# Patient Record
Sex: Male | Born: 1960
Health system: Southern US, Community
[De-identification: ages and names within clinical notes are randomized; demographics above are authoritative.]

## PROBLEM LIST (undated history)

## (undated) DIAGNOSIS — A539 Syphilis, unspecified: Secondary | ICD-10-CM

## (undated) DIAGNOSIS — J189 Pneumonia, unspecified organism: Secondary | ICD-10-CM

## (undated) DIAGNOSIS — B20 Human immunodeficiency virus [HIV] disease: Secondary | ICD-10-CM

## (undated) DIAGNOSIS — I10 Essential (primary) hypertension: Secondary | ICD-10-CM

## (undated) DIAGNOSIS — F419 Anxiety disorder, unspecified: Secondary | ICD-10-CM

## (undated) HISTORY — PX: KNEE ARTHROSCOPY: SUR90

## (undated) HISTORY — DX: Anxiety disorder, unspecified: F41.9

## (undated) HISTORY — DX: Pneumonia, unspecified organism: J18.9

---

## 1999-06-18 ENCOUNTER — Encounter: Admission: RE | Admit: 1999-06-18 | Discharge: 1999-06-18 | Payer: Self-pay | Admitting: Cardiology

## 1999-06-18 ENCOUNTER — Encounter: Payer: Self-pay | Admitting: Cardiology

## 2000-07-15 ENCOUNTER — Emergency Department (HOSPITAL_COMMUNITY): Admission: EM | Admit: 2000-07-15 | Discharge: 2000-07-15 | Payer: Self-pay | Admitting: Emergency Medicine

## 2000-07-31 ENCOUNTER — Emergency Department (HOSPITAL_COMMUNITY): Admission: EM | Admit: 2000-07-31 | Discharge: 2000-07-31 | Payer: Self-pay | Admitting: Emergency Medicine

## 2001-06-03 ENCOUNTER — Encounter: Payer: Self-pay | Admitting: Emergency Medicine

## 2001-06-03 ENCOUNTER — Ambulatory Visit (HOSPITAL_COMMUNITY): Admission: RE | Admit: 2001-06-03 | Discharge: 2001-06-03 | Payer: Self-pay | Admitting: Emergency Medicine

## 2001-09-27 ENCOUNTER — Ambulatory Visit (HOSPITAL_COMMUNITY): Admission: RE | Admit: 2001-09-27 | Discharge: 2001-09-27 | Payer: Self-pay | Admitting: Internal Medicine

## 2001-09-27 ENCOUNTER — Encounter: Payer: Self-pay | Admitting: Internal Medicine

## 2001-10-05 ENCOUNTER — Inpatient Hospital Stay (HOSPITAL_COMMUNITY): Admission: EM | Admit: 2001-10-05 | Discharge: 2001-10-08 | Payer: Self-pay | Admitting: Emergency Medicine

## 2001-10-05 ENCOUNTER — Encounter: Payer: Self-pay | Admitting: Emergency Medicine

## 2001-10-08 ENCOUNTER — Encounter: Payer: Self-pay | Admitting: Internal Medicine

## 2001-10-13 ENCOUNTER — Encounter: Payer: Self-pay | Admitting: Emergency Medicine

## 2001-10-13 ENCOUNTER — Inpatient Hospital Stay (HOSPITAL_COMMUNITY): Admission: EM | Admit: 2001-10-13 | Discharge: 2001-10-19 | Payer: Self-pay | Admitting: Emergency Medicine

## 2001-10-13 ENCOUNTER — Encounter: Payer: Self-pay | Admitting: Internal Medicine

## 2001-10-17 ENCOUNTER — Encounter: Payer: Self-pay | Admitting: Internal Medicine

## 2001-10-18 ENCOUNTER — Encounter: Payer: Self-pay | Admitting: Internal Medicine

## 2001-11-14 ENCOUNTER — Encounter: Payer: Self-pay | Admitting: Internal Medicine

## 2001-11-14 ENCOUNTER — Ambulatory Visit (HOSPITAL_COMMUNITY): Admission: RE | Admit: 2001-11-14 | Discharge: 2001-11-14 | Payer: Self-pay | Admitting: Internal Medicine

## 2002-05-20 ENCOUNTER — Encounter: Admission: RE | Admit: 2002-05-20 | Discharge: 2002-05-20 | Payer: Self-pay | Admitting: General Surgery

## 2002-05-20 ENCOUNTER — Encounter: Payer: Self-pay | Admitting: General Surgery

## 2002-05-21 ENCOUNTER — Encounter (INDEPENDENT_AMBULATORY_CARE_PROVIDER_SITE_OTHER): Payer: Self-pay | Admitting: *Deleted

## 2002-05-21 ENCOUNTER — Ambulatory Visit (HOSPITAL_BASED_OUTPATIENT_CLINIC_OR_DEPARTMENT_OTHER): Admission: RE | Admit: 2002-05-21 | Discharge: 2002-05-21 | Payer: Self-pay | Admitting: General Surgery

## 2002-05-28 ENCOUNTER — Inpatient Hospital Stay (HOSPITAL_COMMUNITY): Admission: EM | Admit: 2002-05-28 | Discharge: 2002-05-31 | Payer: Self-pay | Admitting: Emergency Medicine

## 2003-09-04 ENCOUNTER — Ambulatory Visit: Payer: Self-pay | Admitting: Family Medicine

## 2003-09-26 ENCOUNTER — Ambulatory Visit: Payer: Self-pay | Admitting: Family Medicine

## 2003-12-11 ENCOUNTER — Ambulatory Visit: Payer: Self-pay | Admitting: Family Medicine

## 2003-12-12 ENCOUNTER — Ambulatory Visit: Payer: Self-pay | Admitting: Family Medicine

## 2003-12-16 ENCOUNTER — Ambulatory Visit: Payer: Self-pay | Admitting: *Deleted

## 2003-12-25 ENCOUNTER — Ambulatory Visit: Payer: Self-pay | Admitting: Family Medicine

## 2004-01-14 ENCOUNTER — Ambulatory Visit: Payer: Self-pay | Admitting: Internal Medicine

## 2004-01-22 ENCOUNTER — Ambulatory Visit: Payer: Self-pay | Admitting: Family Medicine

## 2004-02-13 ENCOUNTER — Ambulatory Visit: Payer: Self-pay | Admitting: Family Medicine

## 2004-02-24 ENCOUNTER — Ambulatory Visit: Payer: Self-pay | Admitting: Family Medicine

## 2004-03-26 ENCOUNTER — Ambulatory Visit: Payer: Self-pay | Admitting: Family Medicine

## 2004-04-13 ENCOUNTER — Ambulatory Visit: Payer: Self-pay | Admitting: Family Medicine

## 2004-08-09 ENCOUNTER — Ambulatory Visit: Payer: Self-pay | Admitting: Family Medicine

## 2004-08-12 ENCOUNTER — Ambulatory Visit: Payer: Self-pay | Admitting: Family Medicine

## 2004-09-01 ENCOUNTER — Ambulatory Visit: Payer: Self-pay | Admitting: Family Medicine

## 2004-10-14 ENCOUNTER — Ambulatory Visit (HOSPITAL_COMMUNITY): Admission: RE | Admit: 2004-10-14 | Discharge: 2004-10-14 | Payer: Self-pay | Admitting: Infectious Diseases

## 2004-10-14 ENCOUNTER — Ambulatory Visit: Payer: Self-pay | Admitting: Infectious Diseases

## 2004-10-14 ENCOUNTER — Encounter (INDEPENDENT_AMBULATORY_CARE_PROVIDER_SITE_OTHER): Payer: Self-pay | Admitting: *Deleted

## 2004-10-14 LAB — CONVERTED CEMR LAB
CD4 Count: 820 microliters
CD4 T Cell Abs: 820
HIV 1 RNA Quant: 49 copies/mL

## 2004-11-01 ENCOUNTER — Ambulatory Visit: Payer: Self-pay | Admitting: Infectious Diseases

## 2005-02-16 ENCOUNTER — Encounter (INDEPENDENT_AMBULATORY_CARE_PROVIDER_SITE_OTHER): Payer: Self-pay | Admitting: *Deleted

## 2005-02-16 ENCOUNTER — Ambulatory Visit: Payer: Self-pay | Admitting: Infectious Diseases

## 2005-02-16 LAB — CONVERTED CEMR LAB: CD4 Count: 370 microliters

## 2005-10-04 ENCOUNTER — Encounter: Admission: RE | Admit: 2005-10-04 | Discharge: 2005-10-04 | Payer: Self-pay | Admitting: Infectious Diseases

## 2005-10-04 ENCOUNTER — Ambulatory Visit: Payer: Self-pay | Admitting: Infectious Diseases

## 2005-10-04 ENCOUNTER — Encounter (INDEPENDENT_AMBULATORY_CARE_PROVIDER_SITE_OTHER): Payer: Self-pay | Admitting: *Deleted

## 2005-10-04 DIAGNOSIS — B20 Human immunodeficiency virus [HIV] disease: Secondary | ICD-10-CM | POA: Insufficient documentation

## 2005-10-04 DIAGNOSIS — A6000 Herpesviral infection of urogenital system, unspecified: Secondary | ICD-10-CM | POA: Insufficient documentation

## 2005-10-04 LAB — CONVERTED CEMR LAB: HIV 1 RNA Quant: 49 copies/mL

## 2005-10-18 ENCOUNTER — Ambulatory Visit: Payer: Self-pay | Admitting: Infectious Diseases

## 2005-10-18 ENCOUNTER — Encounter: Payer: Self-pay | Admitting: Internal Medicine

## 2005-11-08 ENCOUNTER — Emergency Department (HOSPITAL_COMMUNITY): Admission: EM | Admit: 2005-11-08 | Discharge: 2005-11-08 | Payer: Self-pay | Admitting: Emergency Medicine

## 2005-11-09 ENCOUNTER — Ambulatory Visit: Payer: Self-pay | Admitting: Infectious Diseases

## 2005-11-09 ENCOUNTER — Ambulatory Visit: Payer: Self-pay | Admitting: *Deleted

## 2005-11-09 ENCOUNTER — Inpatient Hospital Stay (HOSPITAL_COMMUNITY): Admission: EM | Admit: 2005-11-09 | Discharge: 2005-11-19 | Payer: Self-pay | Admitting: Emergency Medicine

## 2005-11-14 ENCOUNTER — Encounter (INDEPENDENT_AMBULATORY_CARE_PROVIDER_SITE_OTHER): Payer: Self-pay | Admitting: Cardiology

## 2005-11-16 ENCOUNTER — Encounter: Payer: Self-pay | Admitting: Vascular Surgery

## 2005-11-23 ENCOUNTER — Encounter (INDEPENDENT_AMBULATORY_CARE_PROVIDER_SITE_OTHER): Payer: Self-pay | Admitting: Ophthalmology

## 2005-11-23 ENCOUNTER — Ambulatory Visit: Payer: Self-pay | Admitting: Internal Medicine

## 2005-11-23 LAB — CONVERTED CEMR LAB
Basophils Relative: 0 % (ref 0–1)
Eosinophils Relative: 1 % (ref 0–4)
HCT: 36.1 % — ABNORMAL LOW (ref 41.0–49.0)
Hemoglobin: 12.2 g/dL — ABNORMAL LOW (ref 13.9–16.8)
Lymphocytes Relative: 33 % (ref 15–43)
Lymphs Abs: 3.3 10*3/uL — ABNORMAL HIGH (ref 0.8–3.1)
MCV: 98.4 fL (ref 78.8–100.0)
Monocytes Absolute: 0.9 10*3/uL — ABNORMAL HIGH (ref 0.2–0.7)
Platelets: 748 10*3/uL — ABNORMAL HIGH (ref 152–374)
RDW: 13.3 % (ref 11.5–15.3)
WBC: 10.2 10*3/uL — ABNORMAL HIGH (ref 3.7–10.0)

## 2005-12-01 ENCOUNTER — Encounter (INDEPENDENT_AMBULATORY_CARE_PROVIDER_SITE_OTHER): Payer: Self-pay | Admitting: Unknown Physician Specialty

## 2005-12-01 ENCOUNTER — Ambulatory Visit: Payer: Self-pay | Admitting: Hospitalist

## 2005-12-01 ENCOUNTER — Ambulatory Visit (HOSPITAL_COMMUNITY): Admission: RE | Admit: 2005-12-01 | Discharge: 2005-12-01 | Payer: Self-pay | Admitting: Hospitalist

## 2005-12-01 LAB — CONVERTED CEMR LAB
Basophils Absolute: 0 10*3/uL (ref 0.0–0.1)
Basophils Relative: 0 % (ref 0–1)
CO2: 27 meq/L (ref 19–32)
Chloride: 102 meq/L (ref 96–112)
Creatinine, Ser: 1.09 mg/dL (ref 0.40–1.50)
MCHC: 33.2 g/dL (ref 33.1–35.4)
MCV: 97.6 fL (ref 78.8–100.0)
Monocytes Relative: 9 % (ref 3–11)
Neutro Abs: 4 10*3/uL (ref 1.8–6.8)
Neutrophils Relative %: 51 % (ref 47–77)
Platelets: 422 10*3/uL — ABNORMAL HIGH (ref 152–374)
Potassium: 4.2 meq/L (ref 3.5–5.3)
RBC: 3.7 M/uL — ABNORMAL LOW (ref 4.20–5.50)
RDW: 13 % (ref 11.5–15.3)
Sodium: 137 meq/L (ref 135–145)

## 2005-12-05 ENCOUNTER — Ambulatory Visit: Payer: Self-pay | Admitting: Internal Medicine

## 2005-12-05 ENCOUNTER — Encounter (INDEPENDENT_AMBULATORY_CARE_PROVIDER_SITE_OTHER): Payer: Self-pay | Admitting: Ophthalmology

## 2005-12-05 LAB — CONVERTED CEMR LAB: Cortisol, Plasma: 9.4 ug/dL

## 2005-12-19 ENCOUNTER — Ambulatory Visit: Payer: Self-pay | Admitting: Internal Medicine

## 2006-01-04 ENCOUNTER — Encounter (INDEPENDENT_AMBULATORY_CARE_PROVIDER_SITE_OTHER): Payer: Self-pay | Admitting: Pulmonary Disease

## 2006-01-04 ENCOUNTER — Ambulatory Visit: Payer: Self-pay | Admitting: Internal Medicine

## 2006-01-04 ENCOUNTER — Ambulatory Visit (HOSPITAL_COMMUNITY): Admission: RE | Admit: 2006-01-04 | Discharge: 2006-01-04 | Payer: Self-pay | Admitting: Internal Medicine

## 2006-01-04 LAB — CONVERTED CEMR LAB
BUN: 8 mg/dL (ref 6–23)
Basophils Relative: 0 % (ref 0–1)
Creatinine, Ser: 0.82 mg/dL (ref 0.40–1.50)
Eosinophils Relative: 1 % (ref 0–5)
Glucose, Bld: 103 mg/dL — ABNORMAL HIGH (ref 70–99)
HCT: 43.1 % (ref 39.0–52.0)
Hemoglobin: 14.5 g/dL (ref 13.0–17.0)
Lymphocytes Relative: 20 % (ref 12–46)
Lymphs Abs: 2.8 10*3/uL (ref 0.7–3.3)
MCV: 98.3 fL (ref 78.0–100.0)
Monocytes Relative: 9 % (ref 3–11)
Platelets: 297 10*3/uL (ref 150–400)
Potassium: 4.3 meq/L (ref 3.5–5.3)
RBC: 4.38 M/uL (ref 4.22–5.81)
WBC: 13.7 10*3/uL — ABNORMAL HIGH (ref 4.0–10.5)

## 2006-01-11 ENCOUNTER — Ambulatory Visit: Payer: Self-pay | Admitting: Internal Medicine

## 2006-01-11 ENCOUNTER — Encounter: Payer: Self-pay | Admitting: Internal Medicine

## 2006-01-11 ENCOUNTER — Encounter (INDEPENDENT_AMBULATORY_CARE_PROVIDER_SITE_OTHER): Payer: Self-pay | Admitting: Internal Medicine

## 2006-02-27 ENCOUNTER — Encounter (INDEPENDENT_AMBULATORY_CARE_PROVIDER_SITE_OTHER): Payer: Self-pay | Admitting: Infectious Diseases

## 2006-02-27 ENCOUNTER — Encounter (INDEPENDENT_AMBULATORY_CARE_PROVIDER_SITE_OTHER): Payer: Self-pay | Admitting: *Deleted

## 2006-02-27 LAB — CONVERTED CEMR LAB

## 2006-03-12 ENCOUNTER — Encounter (INDEPENDENT_AMBULATORY_CARE_PROVIDER_SITE_OTHER): Payer: Self-pay | Admitting: *Deleted

## 2006-03-14 ENCOUNTER — Encounter (INDEPENDENT_AMBULATORY_CARE_PROVIDER_SITE_OTHER): Payer: Self-pay | Admitting: *Deleted

## 2006-03-28 ENCOUNTER — Telehealth (INDEPENDENT_AMBULATORY_CARE_PROVIDER_SITE_OTHER): Payer: Self-pay | Admitting: Infectious Diseases

## 2006-04-27 ENCOUNTER — Telehealth (INDEPENDENT_AMBULATORY_CARE_PROVIDER_SITE_OTHER): Payer: Self-pay | Admitting: Infectious Diseases

## 2006-04-27 ENCOUNTER — Ambulatory Visit: Payer: Self-pay | Admitting: Internal Medicine

## 2006-05-25 ENCOUNTER — Encounter: Payer: Self-pay | Admitting: *Deleted

## 2006-05-25 ENCOUNTER — Inpatient Hospital Stay (HOSPITAL_COMMUNITY): Admission: AD | Admit: 2006-05-25 | Discharge: 2006-05-27 | Payer: Self-pay | Admitting: *Deleted

## 2006-05-25 ENCOUNTER — Ambulatory Visit: Payer: Self-pay | Admitting: Internal Medicine

## 2006-05-25 ENCOUNTER — Encounter (INDEPENDENT_AMBULATORY_CARE_PROVIDER_SITE_OTHER): Payer: Self-pay | Admitting: Infectious Diseases

## 2006-05-25 ENCOUNTER — Encounter (INDEPENDENT_AMBULATORY_CARE_PROVIDER_SITE_OTHER): Payer: Self-pay | Admitting: Pulmonary Disease

## 2006-05-25 ENCOUNTER — Ambulatory Visit: Payer: Self-pay | Admitting: *Deleted

## 2006-05-25 ENCOUNTER — Encounter: Admission: RE | Admit: 2006-05-25 | Discharge: 2006-05-25 | Payer: Self-pay | Admitting: Internal Medicine

## 2006-05-25 DIAGNOSIS — R6510 Systemic inflammatory response syndrome (SIRS) of non-infectious origin without acute organ dysfunction: Secondary | ICD-10-CM | POA: Insufficient documentation

## 2006-05-25 DIAGNOSIS — R000 Tachycardia, unspecified: Secondary | ICD-10-CM

## 2006-05-25 DIAGNOSIS — R350 Frequency of micturition: Secondary | ICD-10-CM

## 2006-05-25 LAB — CONVERTED CEMR LAB: CD4 Count: 340 microliters

## 2006-05-26 ENCOUNTER — Telehealth (INDEPENDENT_AMBULATORY_CARE_PROVIDER_SITE_OTHER): Payer: Self-pay | Admitting: Infectious Diseases

## 2006-05-27 ENCOUNTER — Encounter (INDEPENDENT_AMBULATORY_CARE_PROVIDER_SITE_OTHER): Payer: Self-pay | Admitting: Internal Medicine

## 2006-05-27 DIAGNOSIS — D696 Thrombocytopenia, unspecified: Secondary | ICD-10-CM | POA: Insufficient documentation

## 2006-05-30 ENCOUNTER — Ambulatory Visit: Payer: Self-pay | Admitting: Internal Medicine

## 2006-05-30 ENCOUNTER — Encounter (INDEPENDENT_AMBULATORY_CARE_PROVIDER_SITE_OTHER): Payer: Self-pay | Admitting: Internal Medicine

## 2006-05-30 LAB — CONVERTED CEMR LAB
MCHC: 33.7 g/dL (ref 30.0–36.0)
RBC: 4.55 M/uL (ref 4.22–5.81)
RDW: 13.1 % (ref 11.5–14.0)

## 2006-06-15 ENCOUNTER — Ambulatory Visit: Payer: Self-pay | Admitting: Internal Medicine

## 2006-06-16 ENCOUNTER — Encounter (INDEPENDENT_AMBULATORY_CARE_PROVIDER_SITE_OTHER): Payer: Self-pay | Admitting: Infectious Diseases

## 2006-06-19 ENCOUNTER — Encounter: Admission: RE | Admit: 2006-06-19 | Discharge: 2006-06-19 | Payer: Self-pay | Admitting: Internal Medicine

## 2006-06-19 ENCOUNTER — Ambulatory Visit: Payer: Self-pay | Admitting: Internal Medicine

## 2006-06-19 ENCOUNTER — Telehealth: Payer: Self-pay | Admitting: *Deleted

## 2006-06-19 ENCOUNTER — Encounter (INDEPENDENT_AMBULATORY_CARE_PROVIDER_SITE_OTHER): Payer: Self-pay | Admitting: Infectious Diseases

## 2006-06-19 ENCOUNTER — Encounter (INDEPENDENT_AMBULATORY_CARE_PROVIDER_SITE_OTHER): Payer: Self-pay | Admitting: Internal Medicine

## 2006-06-19 DIAGNOSIS — H524 Presbyopia: Secondary | ICD-10-CM

## 2006-06-19 DIAGNOSIS — R351 Nocturia: Secondary | ICD-10-CM | POA: Insufficient documentation

## 2006-06-22 ENCOUNTER — Ambulatory Visit: Payer: Self-pay | Admitting: Infectious Diseases

## 2006-06-23 ENCOUNTER — Telehealth (INDEPENDENT_AMBULATORY_CARE_PROVIDER_SITE_OTHER): Payer: Self-pay | Admitting: Infectious Diseases

## 2006-07-25 ENCOUNTER — Telehealth: Payer: Self-pay | Admitting: Internal Medicine

## 2006-08-23 ENCOUNTER — Telehealth: Payer: Self-pay | Admitting: Internal Medicine

## 2006-09-14 ENCOUNTER — Ambulatory Visit: Payer: Self-pay | Admitting: Internal Medicine

## 2006-09-20 ENCOUNTER — Encounter: Admission: RE | Admit: 2006-09-20 | Discharge: 2006-09-20 | Payer: Self-pay | Admitting: Internal Medicine

## 2006-09-20 ENCOUNTER — Ambulatory Visit: Payer: Self-pay | Admitting: Internal Medicine

## 2006-09-20 DIAGNOSIS — F528 Other sexual dysfunction not due to a substance or known physiological condition: Secondary | ICD-10-CM

## 2006-09-20 LAB — CONVERTED CEMR LAB
AST: 21 units/L (ref 0–37)
Albumin: 4.4 g/dL (ref 3.5–5.2)
BUN: 11 mg/dL (ref 6–23)
Basophils Relative: 0 % (ref 0–1)
CO2: 25 meq/L (ref 19–32)
Calcium: 9.2 mg/dL (ref 8.4–10.5)
Chlamydia, Swab/Urine, PCR: NEGATIVE
Chloride: 105 meq/L (ref 96–112)
GC Probe Amp, Urine: NEGATIVE
HIV 1 RNA Quant: <50 copies/mL (ref <50)
Lymphocytes Relative: 35 % (ref 12–46)
Lymphs Abs: 3 10*3/uL (ref 0.7–3.3)
MCHC: 35.7 g/dL (ref 30.0–36.0)
Monocytes Relative: 8 % (ref 3–11)
Neutro Abs: 4.9 10*3/uL (ref 1.7–7.7)
Neutrophils Relative %: 56 % (ref 43–77)
Potassium: 4.2 meq/L (ref 3.5–5.3)
RBC: 4.67 M/uL (ref 4.22–5.81)
WBC: 8.7 10*3/uL (ref 4.0–10.5)

## 2006-09-26 ENCOUNTER — Telehealth: Payer: Self-pay | Admitting: Internal Medicine

## 2006-10-06 ENCOUNTER — Ambulatory Visit: Payer: Self-pay | Admitting: Internal Medicine

## 2006-10-12 ENCOUNTER — Telehealth: Payer: Self-pay | Admitting: Internal Medicine

## 2006-10-18 ENCOUNTER — Telehealth: Payer: Self-pay | Admitting: Internal Medicine

## 2006-10-23 ENCOUNTER — Telehealth: Payer: Self-pay | Admitting: Internal Medicine

## 2006-11-07 ENCOUNTER — Telehealth: Payer: Self-pay | Admitting: Internal Medicine

## 2006-11-23 ENCOUNTER — Emergency Department (HOSPITAL_COMMUNITY): Admission: EM | Admit: 2006-11-23 | Discharge: 2006-11-23 | Payer: Self-pay | Admitting: Emergency Medicine

## 2006-11-23 ENCOUNTER — Telehealth: Payer: Self-pay | Admitting: Internal Medicine

## 2006-11-28 ENCOUNTER — Ambulatory Visit: Payer: Self-pay | Admitting: Internal Medicine

## 2006-11-29 LAB — CONVERTED CEMR LAB

## 2006-12-11 ENCOUNTER — Telehealth: Payer: Self-pay | Admitting: Internal Medicine

## 2006-12-25 ENCOUNTER — Telehealth: Payer: Self-pay | Admitting: Internal Medicine

## 2007-01-23 ENCOUNTER — Telehealth: Payer: Self-pay | Admitting: Internal Medicine

## 2007-01-24 ENCOUNTER — Telehealth: Payer: Self-pay | Admitting: Internal Medicine

## 2007-01-24 ENCOUNTER — Ambulatory Visit: Payer: Self-pay | Admitting: Internal Medicine

## 2007-01-24 DIAGNOSIS — M545 Low back pain: Secondary | ICD-10-CM

## 2007-01-24 DIAGNOSIS — J309 Allergic rhinitis, unspecified: Secondary | ICD-10-CM

## 2007-01-24 LAB — CONVERTED CEMR LAB
Blood in Urine, dipstick: NEGATIVE
Ketones, urine, test strip: NEGATIVE
Urobilinogen, UA: 0.2
WBC Urine, dipstick: NEGATIVE

## 2007-02-22 ENCOUNTER — Telehealth: Payer: Self-pay | Admitting: Internal Medicine

## 2007-03-08 ENCOUNTER — Ambulatory Visit: Payer: Self-pay | Admitting: Infectious Diseases

## 2007-03-08 ENCOUNTER — Encounter
Admission: RE | Admit: 2007-03-08 | Discharge: 2007-03-08 | Payer: Self-pay | Admitting: Certified Registered Nurse Anesthetist

## 2007-03-08 ENCOUNTER — Encounter (INDEPENDENT_AMBULATORY_CARE_PROVIDER_SITE_OTHER): Payer: Self-pay | Admitting: Internal Medicine

## 2007-03-08 DIAGNOSIS — R3919 Other difficulties with micturition: Secondary | ICD-10-CM

## 2007-03-08 LAB — CONVERTED CEMR LAB
Albumin: 4.4 g/dL (ref 3.5–5.2)
Alkaline Phosphatase: 61 units/L (ref 39–117)
CO2: 26 meq/L (ref 19–32)
GC Probe Amp, Urine: NEGATIVE
Glucose, Bld: 87 mg/dL (ref 70–99)
HIV-1 RNA Quant, Log: <1.7 (ref <1.70)
Hep B C IgM: NEGATIVE
Hepatitis B Surface Ag: NEGATIVE
Ketones, ur: NEGATIVE mg/dL
Lymphocytes Relative: 24 % (ref 12–46)
Lymphs Abs: 3 10*3/uL (ref 0.7–4.0)
Neutro Abs: 8.4 10*3/uL — ABNORMAL HIGH (ref 1.7–7.7)
Neutrophils Relative %: 67 % (ref 43–77)
Nitrite: NEGATIVE
Platelets: 225 10*3/uL (ref 150–400)
Potassium: 3.9 meq/L (ref 3.5–5.3)
RBC / HPF: NONE SEEN (ref <3)
RPR Titer: 1:8 {titer}
Sodium: 139 meq/L (ref 135–145)
Specific Gravity, Urine: 1.011 (ref 1.005–1.03)
Total Protein: 7.3 g/dL (ref 6.0–8.3)
WBC, UA: NONE SEEN cells/hpf (ref <3)
WBC: 12.6 10*3/uL — ABNORMAL HIGH (ref 4.0–10.5)
pH: 6.5 (ref 5.0–8.0)

## 2007-03-09 ENCOUNTER — Ambulatory Visit: Payer: Self-pay | Admitting: Infectious Diseases

## 2007-03-09 ENCOUNTER — Encounter (INDEPENDENT_AMBULATORY_CARE_PROVIDER_SITE_OTHER): Payer: Self-pay | Admitting: Internal Medicine

## 2007-03-09 ENCOUNTER — Encounter: Admission: RE | Admit: 2007-03-09 | Discharge: 2007-03-09 | Payer: Self-pay | Admitting: Infectious Diseases

## 2007-03-09 DIAGNOSIS — A539 Syphilis, unspecified: Secondary | ICD-10-CM

## 2007-03-09 DIAGNOSIS — D551 Anemia due to other disorders of glutathione metabolism: Secondary | ICD-10-CM | POA: Insufficient documentation

## 2007-03-09 LAB — CONVERTED CEMR LAB: Retic Ct Pct: 2.1 %

## 2007-03-10 ENCOUNTER — Encounter (INDEPENDENT_AMBULATORY_CARE_PROVIDER_SITE_OTHER): Payer: Self-pay | Admitting: *Deleted

## 2007-03-12 ENCOUNTER — Encounter (INDEPENDENT_AMBULATORY_CARE_PROVIDER_SITE_OTHER): Payer: Self-pay | Admitting: *Deleted

## 2007-03-14 ENCOUNTER — Ambulatory Visit: Payer: Self-pay | Admitting: Internal Medicine

## 2007-03-15 ENCOUNTER — Telehealth: Payer: Self-pay

## 2007-03-20 ENCOUNTER — Telehealth: Payer: Self-pay | Admitting: Internal Medicine

## 2007-03-23 LAB — CONVERTED CEMR LAB
Basophils Absolute: 0 10*3/uL (ref 0.0–0.1)
Basophils Relative: 0 % (ref 0–1)
Calcium: 9.3 mg/dL (ref 8.4–10.5)
Creatinine, Ser: 1.24 mg/dL (ref 0.40–1.50)
Eosinophils Absolute: 0.1 10*3/uL (ref 0.0–0.7)
Eosinophils Relative: 1 % (ref 0–5)
HCT: 33.2 % — ABNORMAL LOW (ref 39.0–52.0)
LDH: 297 units/L — ABNORMAL HIGH (ref 94–250)
Lymphocytes Relative: 32 % (ref 12–46)
MCHC: 33.4 g/dL (ref 30.0–36.0)
MCV: 103.4 fL — ABNORMAL HIGH (ref 78.0–100.0)
Monocytes Absolute: 1 10*3/uL (ref 0.1–1.0)
Platelets: 345 10*3/uL (ref 150–400)
RDW: 14.9 % (ref 11.5–15.5)
Sodium: 139 meq/L (ref 135–145)
Total Bilirubin: 0.5 mg/dL (ref 0.3–1.2)

## 2007-04-17 ENCOUNTER — Encounter (INDEPENDENT_AMBULATORY_CARE_PROVIDER_SITE_OTHER): Payer: Self-pay | Admitting: *Deleted

## 2007-04-20 ENCOUNTER — Emergency Department (HOSPITAL_COMMUNITY): Admission: EM | Admit: 2007-04-20 | Discharge: 2007-04-20 | Payer: Self-pay | Admitting: Emergency Medicine

## 2007-04-20 ENCOUNTER — Telehealth: Payer: Self-pay | Admitting: Internal Medicine

## 2007-04-25 ENCOUNTER — Ambulatory Visit: Payer: Self-pay | Admitting: Internal Medicine

## 2007-04-25 DIAGNOSIS — J301 Allergic rhinitis due to pollen: Secondary | ICD-10-CM

## 2007-04-25 LAB — CONVERTED CEMR LAB
Chlamydia, Swab/Urine, PCR: NEGATIVE
GC Probe Amp, Urine: NEGATIVE

## 2007-04-30 ENCOUNTER — Telehealth (INDEPENDENT_AMBULATORY_CARE_PROVIDER_SITE_OTHER): Payer: Self-pay | Admitting: *Deleted

## 2007-05-23 ENCOUNTER — Telehealth (INDEPENDENT_AMBULATORY_CARE_PROVIDER_SITE_OTHER): Payer: Self-pay | Admitting: *Deleted

## 2007-06-22 ENCOUNTER — Ambulatory Visit: Payer: Self-pay | Admitting: Internal Medicine

## 2007-06-25 ENCOUNTER — Telehealth (INDEPENDENT_AMBULATORY_CARE_PROVIDER_SITE_OTHER): Payer: Self-pay | Admitting: *Deleted

## 2007-07-02 ENCOUNTER — Ambulatory Visit: Payer: Self-pay | Admitting: Internal Medicine

## 2007-07-02 ENCOUNTER — Encounter: Admission: RE | Admit: 2007-07-02 | Discharge: 2007-07-02 | Payer: Self-pay | Admitting: Internal Medicine

## 2007-07-02 LAB — CONVERTED CEMR LAB
Alkaline Phosphatase: 67 units/L (ref 39–117)
Basophils Absolute: 0 10*3/uL (ref 0.0–0.1)
CO2: 26 meq/L (ref 19–32)
Cholesterol: 185 mg/dL (ref 0–200)
Creatinine, Ser: 1.34 mg/dL (ref 0.40–1.50)
Eosinophils Absolute: 0.1 10*3/uL (ref 0.0–0.7)
Eosinophils Relative: 2 % (ref 0–5)
Glucose, Bld: 61 mg/dL — ABNORMAL LOW (ref 70–99)
HCT: 43 % (ref 39.0–52.0)
HIV-1 RNA Quant, Log: <1.7 (ref <1.70)
Hemoglobin: 14.4 g/dL (ref 13.0–17.0)
LDL Cholesterol: 89 mg/dL (ref 0–99)
MCV: 101.4 fL — ABNORMAL HIGH (ref 78.0–100.0)
Monocytes Absolute: 0.8 10*3/uL (ref 0.1–1.0)
Platelets: 266 10*3/uL (ref 150–400)
RDW: 13 % (ref 11.5–15.5)
Sodium: 140 meq/L (ref 135–145)
Total Bilirubin: 0.5 mg/dL (ref 0.3–1.2)
Total CHOL/HDL Ratio: 3.7
Total Protein: 7 g/dL (ref 6.0–8.3)
Triglycerides: 229 mg/dL — ABNORMAL HIGH (ref <150)
VLDL: 46 mg/dL — ABNORMAL HIGH (ref 0–40)

## 2007-07-17 ENCOUNTER — Ambulatory Visit: Payer: Self-pay | Admitting: Internal Medicine

## 2007-07-17 DIAGNOSIS — R5383 Other fatigue: Secondary | ICD-10-CM

## 2007-07-17 DIAGNOSIS — R5381 Other malaise: Secondary | ICD-10-CM | POA: Insufficient documentation

## 2007-07-24 ENCOUNTER — Telehealth (INDEPENDENT_AMBULATORY_CARE_PROVIDER_SITE_OTHER): Payer: Self-pay | Admitting: *Deleted

## 2007-08-21 ENCOUNTER — Telehealth (INDEPENDENT_AMBULATORY_CARE_PROVIDER_SITE_OTHER): Payer: Self-pay | Admitting: *Deleted

## 2007-09-20 ENCOUNTER — Telehealth (INDEPENDENT_AMBULATORY_CARE_PROVIDER_SITE_OTHER): Payer: Self-pay | Admitting: *Deleted

## 2007-10-15 ENCOUNTER — Ambulatory Visit: Payer: Self-pay | Admitting: Internal Medicine

## 2007-10-17 ENCOUNTER — Telehealth (INDEPENDENT_AMBULATORY_CARE_PROVIDER_SITE_OTHER): Payer: Self-pay | Admitting: *Deleted

## 2007-10-19 ENCOUNTER — Ambulatory Visit: Payer: Self-pay | Admitting: Internal Medicine

## 2007-10-19 LAB — CONVERTED CEMR LAB
ALT: 21 units/L (ref 0–53)
AST: 25 units/L (ref 0–37)
Albumin: 4.1 g/dL (ref 3.5–5.2)
Basophils Absolute: 0 10*3/uL (ref 0.0–0.1)
Basophils Relative: 0 % (ref 0–1)
Calcium: 9.2 mg/dL (ref 8.4–10.5)
Chlamydia, Swab/Urine, PCR: NEGATIVE
Chloride: 104 meq/L (ref 96–112)
GC Probe Amp, Urine: NEGATIVE
HIV-1 RNA Quant, Log: <1.7 (ref <1.70)
MCHC: 34.1 g/dL (ref 30.0–36.0)
Neutro Abs: 4.9 10*3/uL (ref 1.7–7.7)
Neutrophils Relative %: 56 % (ref 43–77)
Potassium: 4.3 meq/L (ref 3.5–5.3)
RBC: 4.82 M/uL (ref 4.22–5.81)
RDW: 12.2 % (ref 11.5–15.5)
Sodium: 139 meq/L (ref 135–145)
Total Protein: 7.1 g/dL (ref 6.0–8.3)

## 2007-10-29 ENCOUNTER — Telehealth: Payer: Self-pay | Admitting: *Deleted

## 2007-10-29 ENCOUNTER — Emergency Department (HOSPITAL_COMMUNITY): Admission: EM | Admit: 2007-10-29 | Discharge: 2007-10-29 | Payer: Self-pay | Admitting: Emergency Medicine

## 2007-11-02 ENCOUNTER — Ambulatory Visit: Payer: Self-pay | Admitting: Internal Medicine

## 2007-11-16 ENCOUNTER — Telehealth (INDEPENDENT_AMBULATORY_CARE_PROVIDER_SITE_OTHER): Payer: Self-pay | Admitting: *Deleted

## 2007-12-11 ENCOUNTER — Telehealth (INDEPENDENT_AMBULATORY_CARE_PROVIDER_SITE_OTHER): Payer: Self-pay | Admitting: *Deleted

## 2008-01-07 ENCOUNTER — Encounter (INDEPENDENT_AMBULATORY_CARE_PROVIDER_SITE_OTHER): Payer: Self-pay | Admitting: *Deleted

## 2008-01-07 ENCOUNTER — Ambulatory Visit: Payer: Self-pay | Admitting: Internal Medicine

## 2008-01-07 DIAGNOSIS — J029 Acute pharyngitis, unspecified: Secondary | ICD-10-CM | POA: Insufficient documentation

## 2008-01-07 LAB — CONVERTED CEMR LAB
GC Probe Amp, Urine: NEGATIVE
RPR Ser Ql: REACTIVE — AB

## 2008-01-10 ENCOUNTER — Telehealth (INDEPENDENT_AMBULATORY_CARE_PROVIDER_SITE_OTHER): Payer: Self-pay | Admitting: *Deleted

## 2008-01-11 ENCOUNTER — Telehealth (INDEPENDENT_AMBULATORY_CARE_PROVIDER_SITE_OTHER): Payer: Self-pay | Admitting: *Deleted

## 2008-01-28 ENCOUNTER — Encounter: Payer: Self-pay | Admitting: Internal Medicine

## 2008-01-28 ENCOUNTER — Ambulatory Visit (HOSPITAL_COMMUNITY): Admission: RE | Admit: 2008-01-28 | Discharge: 2008-01-28 | Payer: Self-pay | Admitting: Internal Medicine

## 2008-01-28 ENCOUNTER — Telehealth: Payer: Self-pay | Admitting: *Deleted

## 2008-01-28 ENCOUNTER — Ambulatory Visit: Payer: Self-pay | Admitting: Internal Medicine

## 2008-02-08 ENCOUNTER — Telehealth (INDEPENDENT_AMBULATORY_CARE_PROVIDER_SITE_OTHER): Payer: Self-pay | Admitting: *Deleted

## 2008-02-22 ENCOUNTER — Ambulatory Visit: Payer: Self-pay | Admitting: Internal Medicine

## 2008-02-22 ENCOUNTER — Encounter: Payer: Self-pay | Admitting: Internal Medicine

## 2008-02-26 LAB — CONVERTED CEMR LAB
Sex Hormone Binding: 53 nmol/L (ref 13–71)
Testosterone: 1284.83 ng/dL — ABNORMAL HIGH (ref 350–890)

## 2008-03-13 ENCOUNTER — Telehealth (INDEPENDENT_AMBULATORY_CARE_PROVIDER_SITE_OTHER): Payer: Self-pay | Admitting: *Deleted

## 2008-03-25 ENCOUNTER — Ambulatory Visit: Payer: Self-pay | Admitting: Internal Medicine

## 2008-03-27 ENCOUNTER — Encounter (INDEPENDENT_AMBULATORY_CARE_PROVIDER_SITE_OTHER): Payer: Self-pay | Admitting: *Deleted

## 2008-04-07 ENCOUNTER — Telehealth (INDEPENDENT_AMBULATORY_CARE_PROVIDER_SITE_OTHER): Payer: Self-pay | Admitting: *Deleted

## 2008-04-08 ENCOUNTER — Encounter: Payer: Self-pay | Admitting: Internal Medicine

## 2008-04-09 ENCOUNTER — Encounter: Payer: Self-pay | Admitting: Internal Medicine

## 2008-04-15 ENCOUNTER — Encounter: Payer: Self-pay | Admitting: Internal Medicine

## 2008-04-25 ENCOUNTER — Encounter (INDEPENDENT_AMBULATORY_CARE_PROVIDER_SITE_OTHER): Payer: Self-pay | Admitting: *Deleted

## 2008-04-30 ENCOUNTER — Telehealth (INDEPENDENT_AMBULATORY_CARE_PROVIDER_SITE_OTHER): Payer: Self-pay | Admitting: *Deleted

## 2008-05-09 ENCOUNTER — Ambulatory Visit: Payer: Self-pay | Admitting: Internal Medicine

## 2008-05-09 LAB — CONVERTED CEMR LAB
Bacteria, UA: NONE SEEN
Basophils Absolute: 0 10*3/uL (ref 0.0–0.1)
Bilirubin Urine: NEGATIVE
CO2: 26 meq/L (ref 19–32)
Chlamydia, DNA Probe: NEGATIVE
GFR calc Af Amer: >60 mL/min (ref >60)
GFR calc non Af Amer: 58 mL/min — ABNORMAL LOW (ref >60)
Glucose, Bld: 88 mg/dL (ref 70–99)
HIV 1 RNA Quant: <48 copies/mL (ref <48)
HIV-1 RNA Quant, Log: <1.68 (ref <1.68)
Hemoglobin, Urine: NEGATIVE
Hemoglobin: 15.3 g/dL (ref 13.0–17.0)
Ketones, ur: NEGATIVE mg/dL
Lymphocytes Relative: 39 % (ref 12–46)
Monocytes Absolute: 0.7 10*3/uL (ref 0.1–1.0)
Neutro Abs: 4.2 10*3/uL (ref 1.7–7.7)
Neutrophils Relative %: 48 % (ref 43–77)
Platelets: 262 10*3/uL (ref 150–400)
Protein, ur: 30 mg/dL — AB
RDW: 12.8 % (ref 11.5–15.5)
Sodium: 140 meq/L (ref 135–145)
Total Bilirubin: 1 mg/dL (ref 0.3–1.2)
Total Protein: 7.4 g/dL (ref 6.0–8.3)
Urobilinogen, UA: 0.2 (ref 0.0–1.0)

## 2008-05-28 ENCOUNTER — Ambulatory Visit: Payer: Self-pay | Admitting: Internal Medicine

## 2008-05-29 ENCOUNTER — Telehealth: Payer: Self-pay

## 2008-06-06 ENCOUNTER — Telehealth (INDEPENDENT_AMBULATORY_CARE_PROVIDER_SITE_OTHER): Payer: Self-pay | Admitting: *Deleted

## 2008-06-21 IMAGING — XA IR US GUIDE VASC ACCESS RIGHT
1 series · 2 of 2 positions shown · non-contrast
Comparison: none

CLINICAL DATA: Hypotension.  Request to place PICC line.
 UPPER EXTREMITY PICC PLACEMENT WITH ULTRASOUND AND FLUORO GUIDANCE:
TECHNIQUE: The right arm was prepped with Betadine, draped in the usual sterile fashion, and infiltrated locally with 1% Lidocaine.  Ultrasound demonstrated patency of the right brachial vein.  Under real-time ultrasound guidance, this vein was accessed with a 21 gauge micropuncture needle.  Ultrasound image documentation was performed.  The needle was exchanged over a guidewire for a peel-away sheath through which a 5 French triple lumen PICC catheter trimmed to 42cm was advanced, positioned with its tip at the distal SVC/right atrial junction.  Fluoroscopy during the procedure and fluoro spot radiograph confirms appropriate catheter position.  The catheter was flushed, secured to the skin with Prolene sutures, and covered with a sterile dressing. No immediate complication. 
 Fluoro. time:  0.2 minutes

[Series 1: run · 2 of 2 slices shown]
[im 1/2]
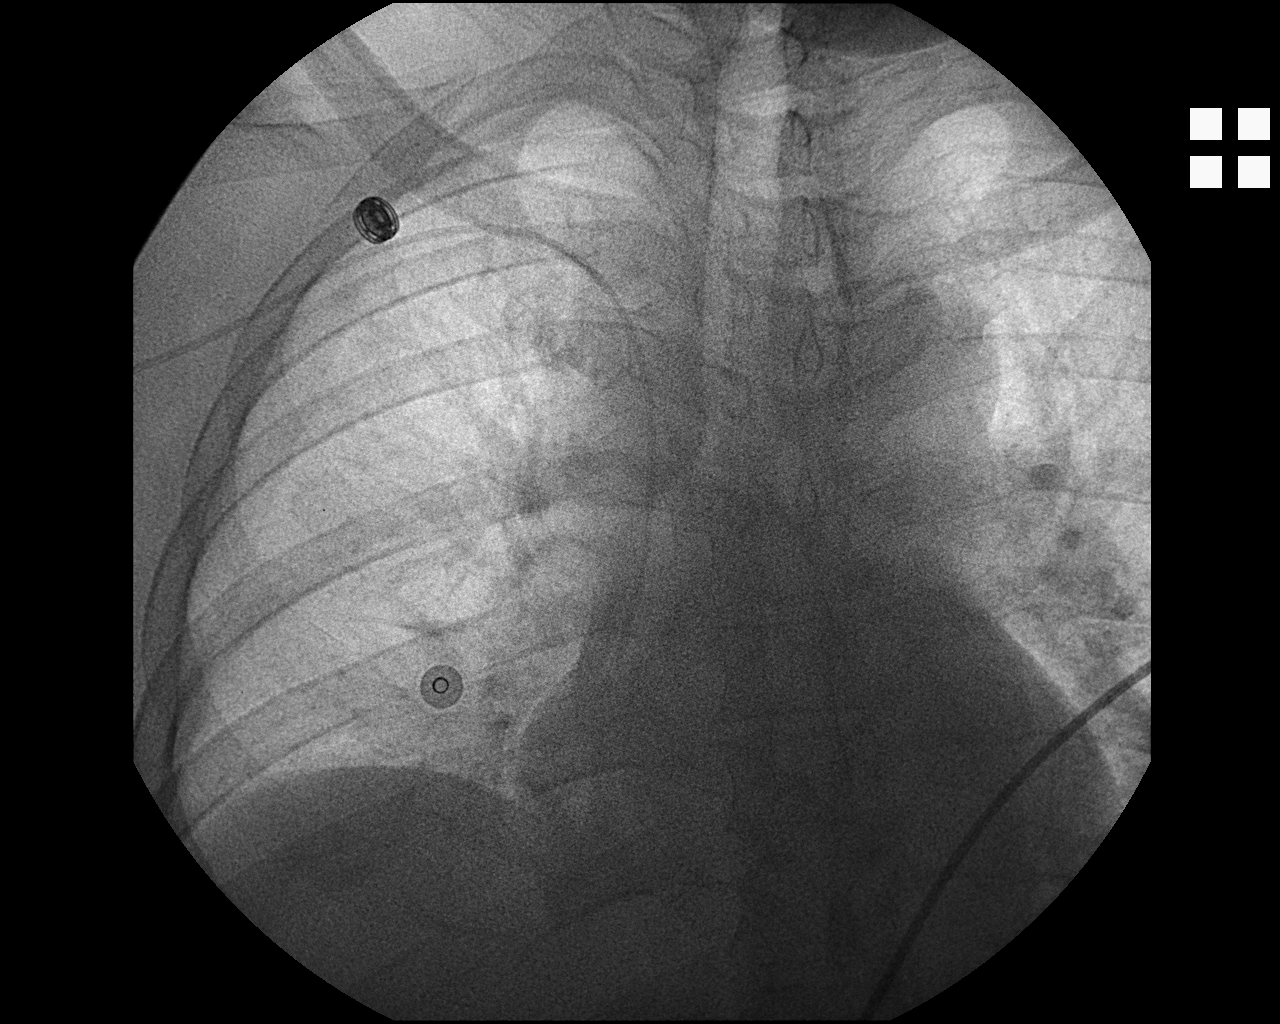
[im 2/2]
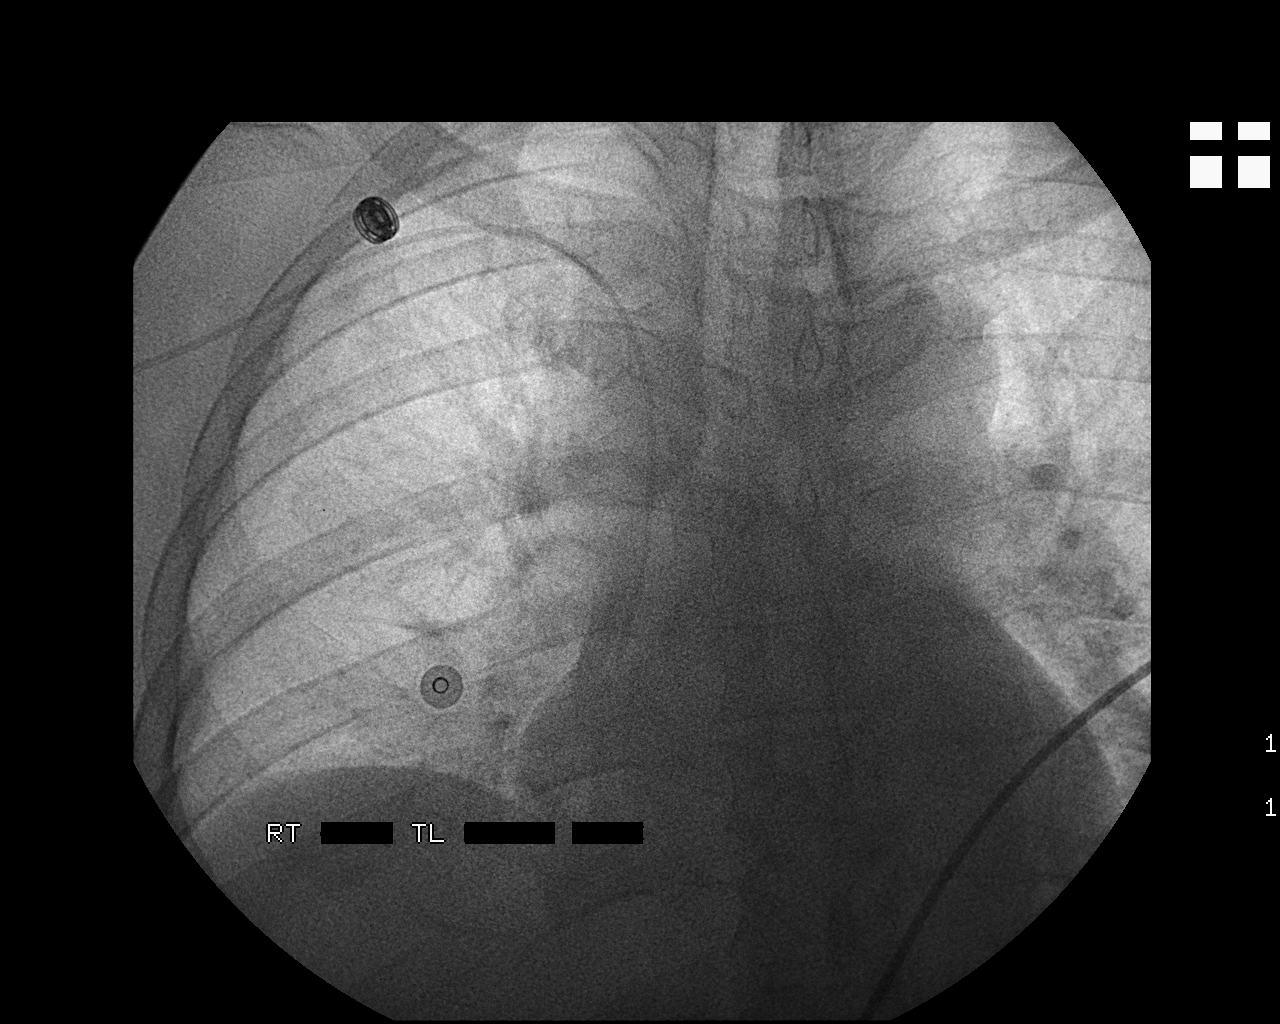

[2 of 2 positions shown; findings below may reference images not displayed]

IMPRESSION: Technically successful right arm PICC placement with ultrasound and fluoroscopic guidance.  Ready for routine use.

## 2008-06-21 IMAGING — CR DG CHEST 1V PORT
1 series · 1 of 1 positions shown · non-contrast
Comparison: 11/08/05.

CLINICAL DATA: 44-year-old with hypertension. 
 PORTABLE CHEST ? 1 VIEW:

[view not recorded]
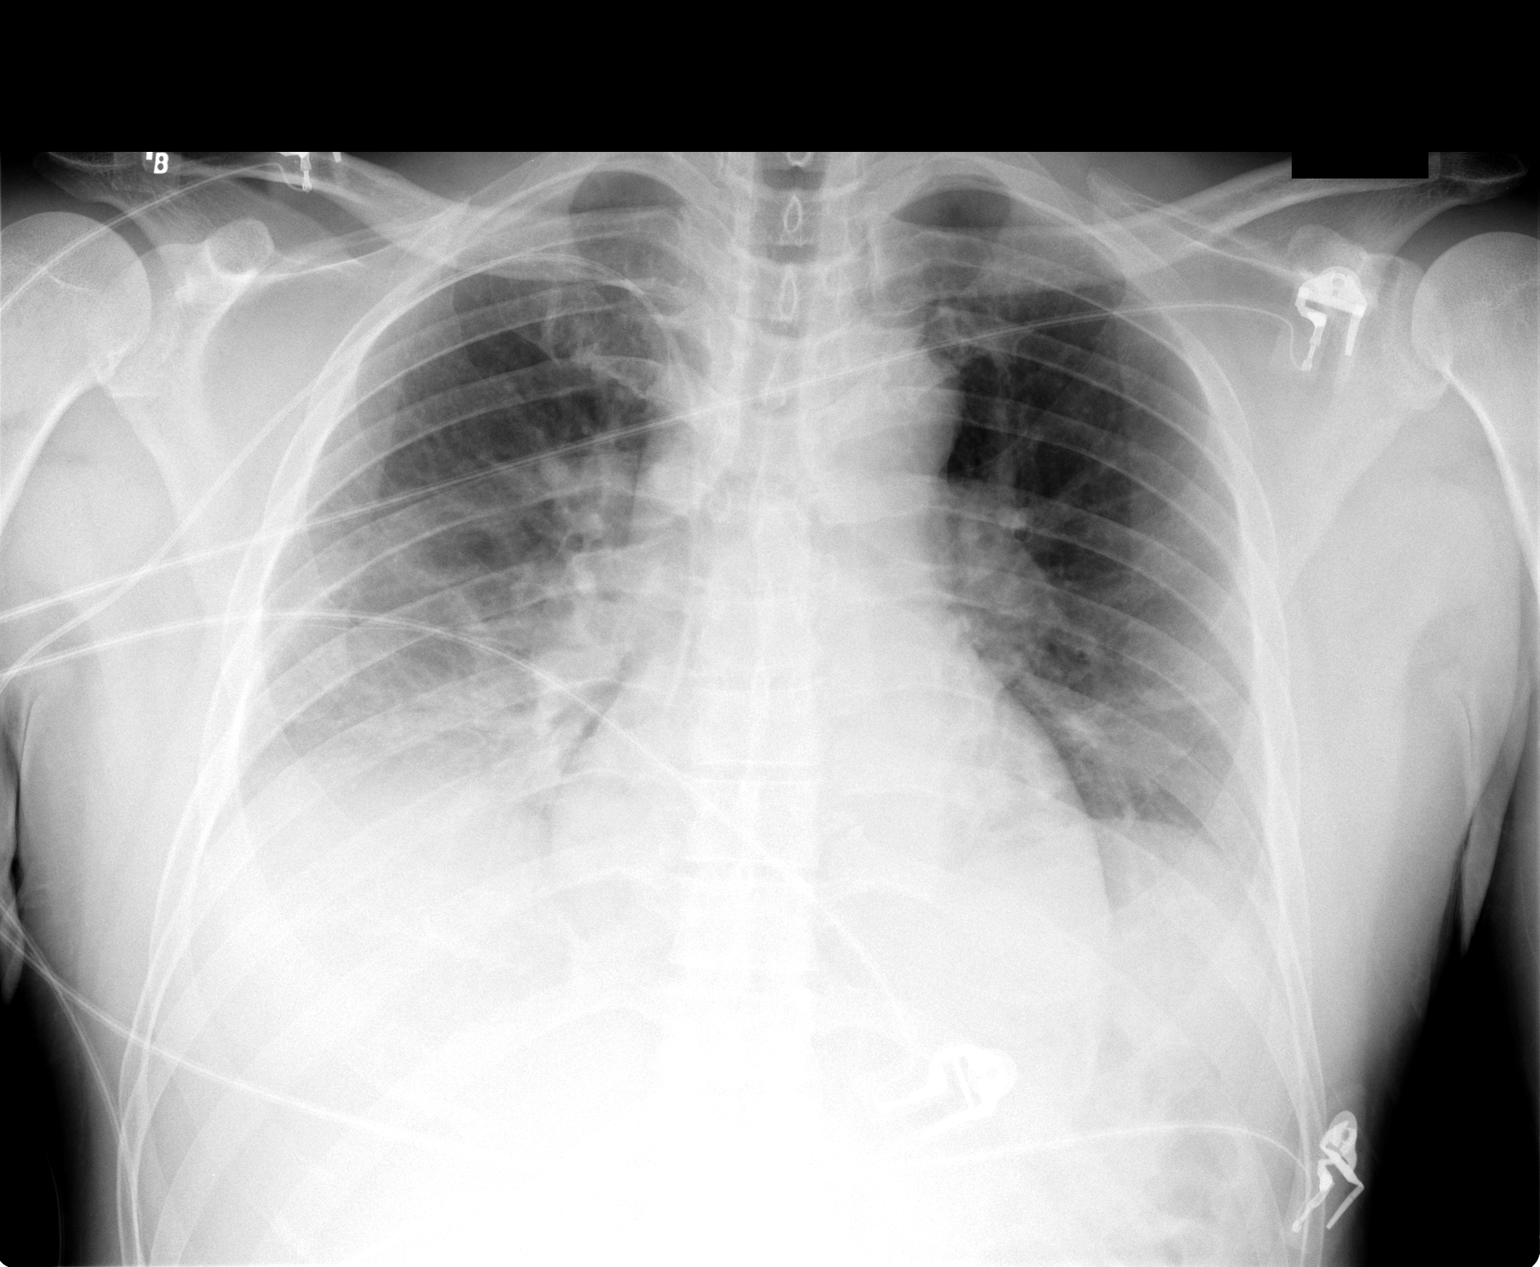

[1 of 1 positions shown; findings below may reference images not displayed]

FINDINGS: Right PICC line tip is in good position in the distal SVC, near the cavoatrial junction.  Very low volume chest film with vascular crowding and bibasilar atelectasis.  There may be a small right effusion.
IMPRESSION: 1. Right PICC line tip in good position in the distal SVC at the cavoatrial junction. 
 2. Interval development of more significant bibasilar atelectasis, right greater than left.

## 2008-06-21 IMAGING — CR DG CHEST 1V PORT
1 series · 1 of 1 positions shown · non-contrast
Comparison: 11/08/2005

CLINICAL DATA: Short of breath.
 PORTABLE CHEST - 11/09/2005 AT 5125 HOURS:

[view not recorded]
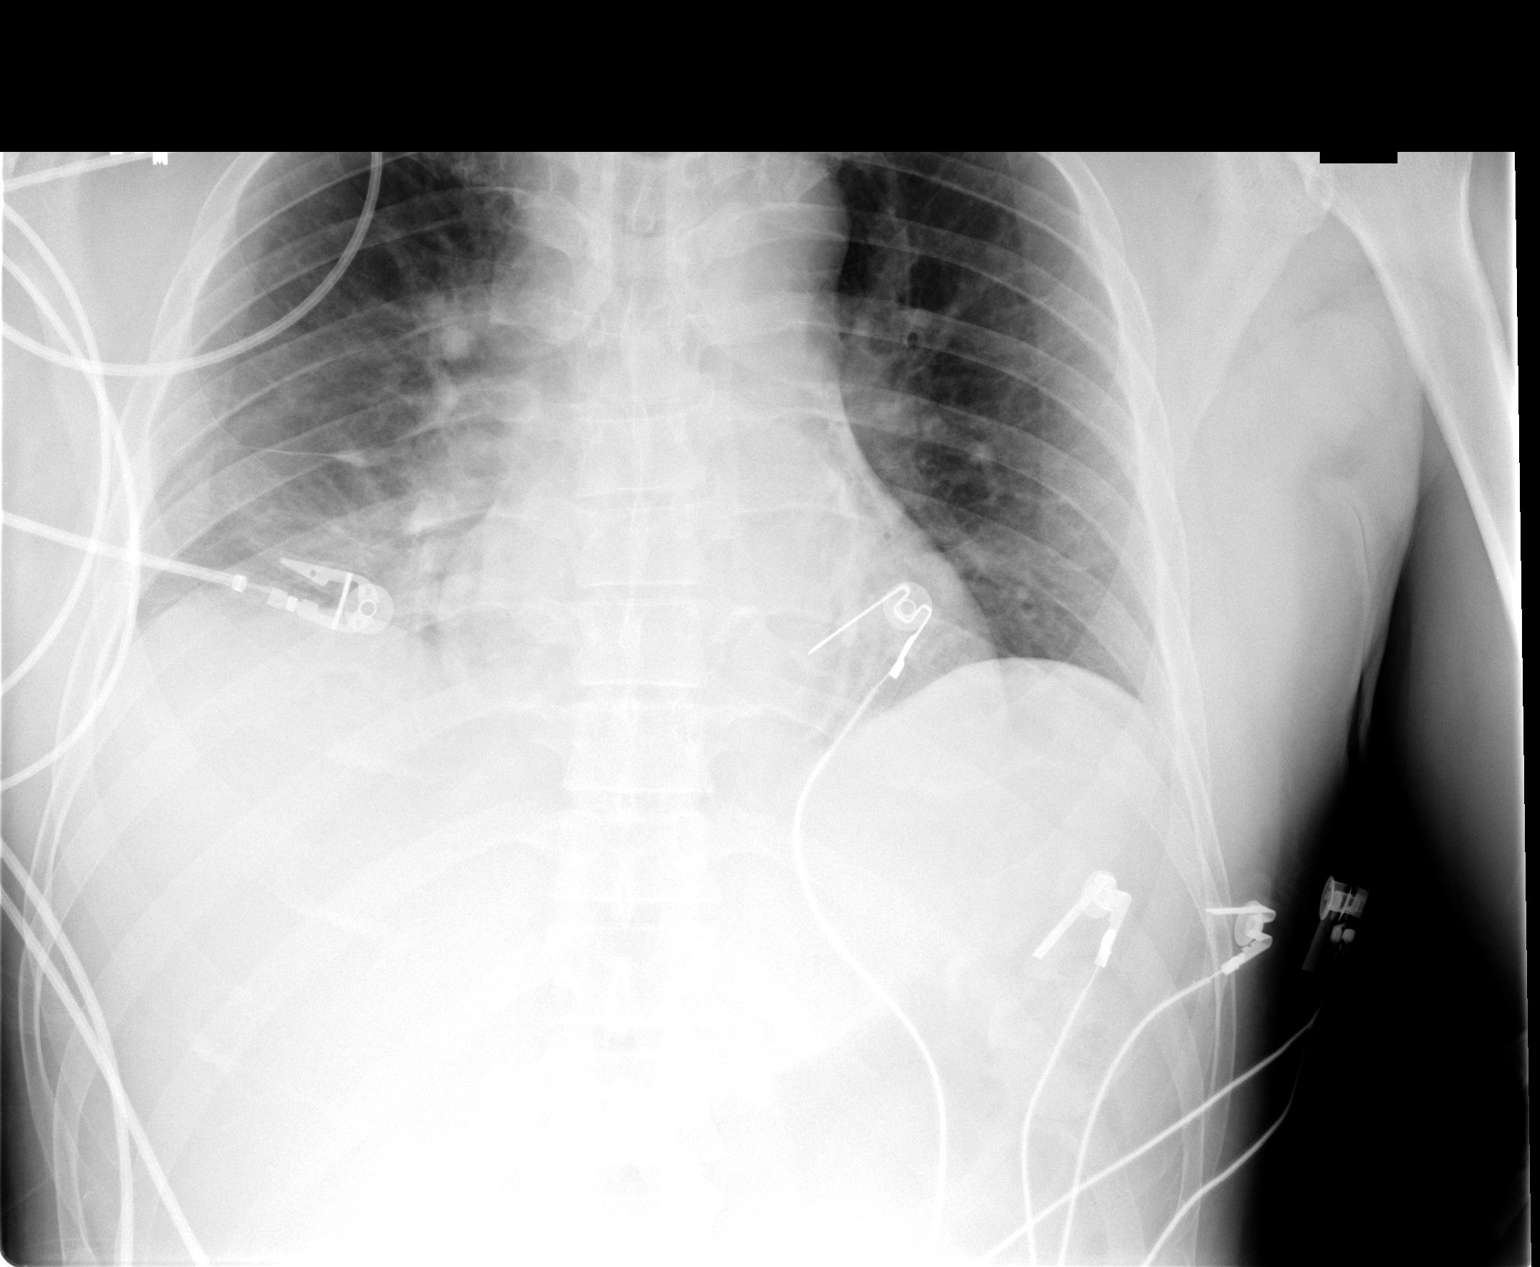

[1 of 1 positions shown; findings below may reference images not displayed]

FINDINGS: Right lower lobe airspace disease has developed.  The left lung is under inflated and grossly clear.   Peribronchial thickening is stable. Lungs are under inflated.
IMPRESSION: Interval development of right lower lobe airspace disease.

## 2008-07-18 ENCOUNTER — Telehealth: Payer: Self-pay | Admitting: Infectious Diseases

## 2008-07-28 ENCOUNTER — Telehealth (INDEPENDENT_AMBULATORY_CARE_PROVIDER_SITE_OTHER): Payer: Self-pay | Admitting: *Deleted

## 2008-07-30 ENCOUNTER — Telehealth (INDEPENDENT_AMBULATORY_CARE_PROVIDER_SITE_OTHER): Payer: Self-pay | Admitting: *Deleted

## 2008-08-22 ENCOUNTER — Telehealth (INDEPENDENT_AMBULATORY_CARE_PROVIDER_SITE_OTHER): Payer: Self-pay | Admitting: *Deleted

## 2008-09-23 ENCOUNTER — Ambulatory Visit: Payer: Self-pay | Admitting: Internal Medicine

## 2008-09-23 LAB — CONVERTED CEMR LAB
ALT: 73 units/L — ABNORMAL HIGH (ref 0–53)
AST: 43 units/L — ABNORMAL HIGH (ref 0–37)
Albumin: 4.2 g/dL (ref 3.5–5.2)
Alkaline Phosphatase: 83 units/L (ref 39–117)
Basophils Absolute: 0 10*3/uL (ref 0.0–0.1)
Basophils Relative: 0 % (ref 0–1)
Calcium: 9.5 mg/dL (ref 8.4–10.5)
Chloride: 106 meq/L (ref 96–112)
Creatinine, Ser: 1.24 mg/dL (ref 0.40–1.50)
Eosinophils Relative: 2 % (ref 0–5)
HCT: 41.8 % (ref 39.0–52.0)
Lymphocytes Relative: 35 % (ref 12–46)
MCHC: 34.4 g/dL (ref 30.0–36.0)
Platelets: 325 10*3/uL (ref 150–400)
Potassium: 4.6 meq/L (ref 3.5–5.3)
RDW: 12.4 % (ref 11.5–15.5)

## 2008-09-24 ENCOUNTER — Telehealth (INDEPENDENT_AMBULATORY_CARE_PROVIDER_SITE_OTHER): Payer: Self-pay | Admitting: *Deleted

## 2008-10-08 ENCOUNTER — Ambulatory Visit: Payer: Self-pay | Admitting: Internal Medicine

## 2008-10-08 DIAGNOSIS — R21 Rash and other nonspecific skin eruption: Secondary | ICD-10-CM | POA: Insufficient documentation

## 2008-10-09 LAB — CONVERTED CEMR LAB: Chlamydia, Swab/Urine, PCR: NEGATIVE

## 2008-10-22 ENCOUNTER — Telehealth (INDEPENDENT_AMBULATORY_CARE_PROVIDER_SITE_OTHER): Payer: Self-pay | Admitting: *Deleted

## 2008-11-20 ENCOUNTER — Telehealth (INDEPENDENT_AMBULATORY_CARE_PROVIDER_SITE_OTHER): Payer: Self-pay | Admitting: *Deleted

## 2008-12-09 ENCOUNTER — Ambulatory Visit: Payer: Self-pay | Admitting: Internal Medicine

## 2008-12-22 ENCOUNTER — Telehealth (INDEPENDENT_AMBULATORY_CARE_PROVIDER_SITE_OTHER): Payer: Self-pay | Admitting: *Deleted

## 2008-12-22 ENCOUNTER — Telehealth: Payer: Self-pay | Admitting: Internal Medicine

## 2008-12-22 ENCOUNTER — Ambulatory Visit: Payer: Self-pay | Admitting: Internal Medicine

## 2008-12-22 DIAGNOSIS — L03211 Cellulitis of face: Secondary | ICD-10-CM

## 2008-12-22 DIAGNOSIS — L0201 Cutaneous abscess of face: Secondary | ICD-10-CM

## 2009-01-04 IMAGING — CT CT HEAD W/O CM
1 of 2 series · 13 of 30 positions shown, 17 images · non-contrast
Comparison: none

HISTORY: Meningococcemia, sepsis, headache, [place] Guilty, dizziness

[Series 2: brain · axial · 0.47mm/px · z∈[+141,+280]mm · 13 of 32 slices shown, 17 images]
[im 3/32  brain]
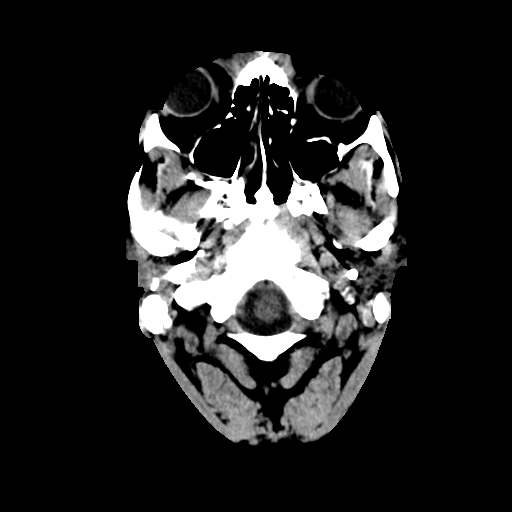
[im 3/32  bone]
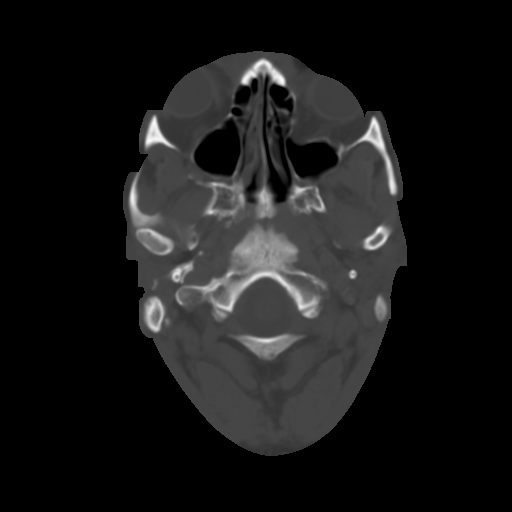
[im 5/32  brain]
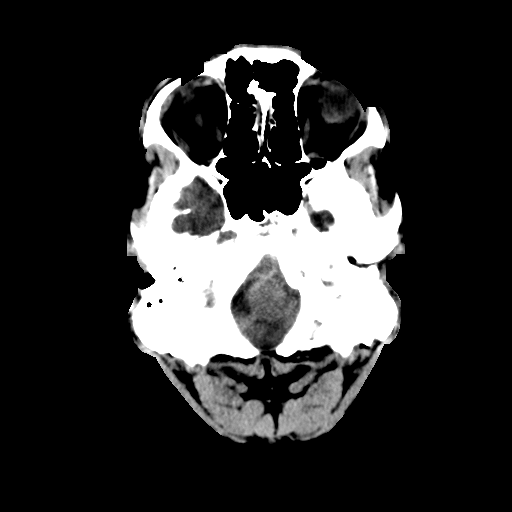
[im 7/32  brain]
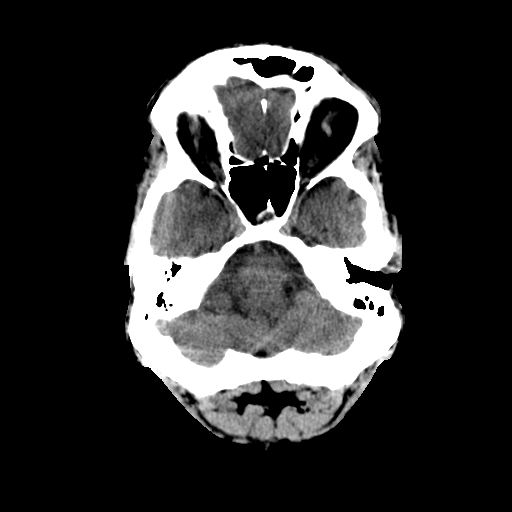
[im 9/32  brain]
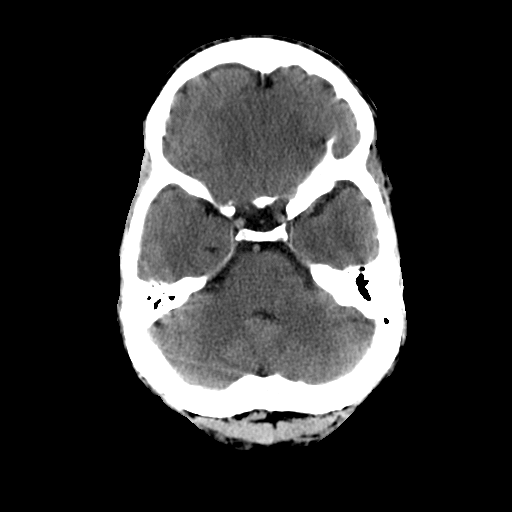
[im 12/32  brain]
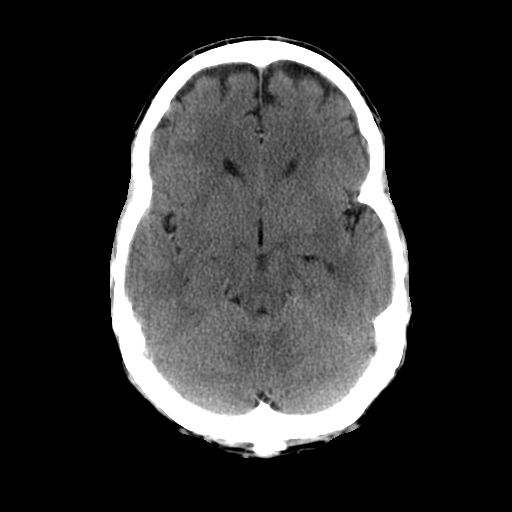
[im 12/32  bone]
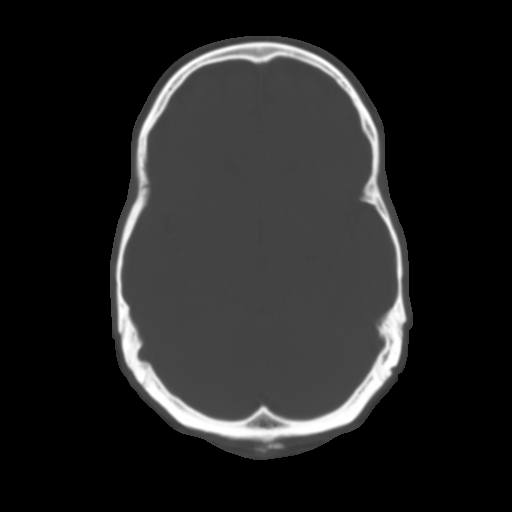
[im 14/32  brain]
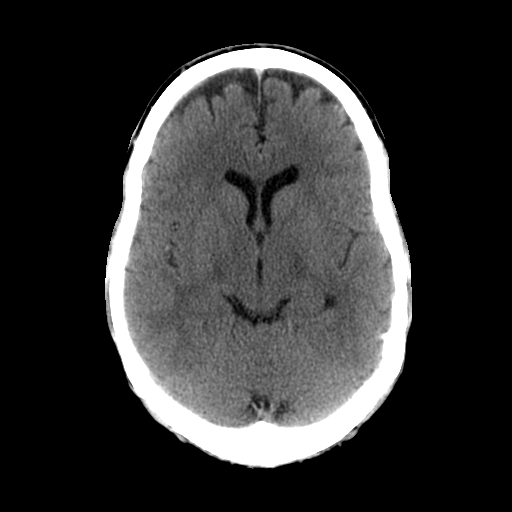
[im 16/32  brain]
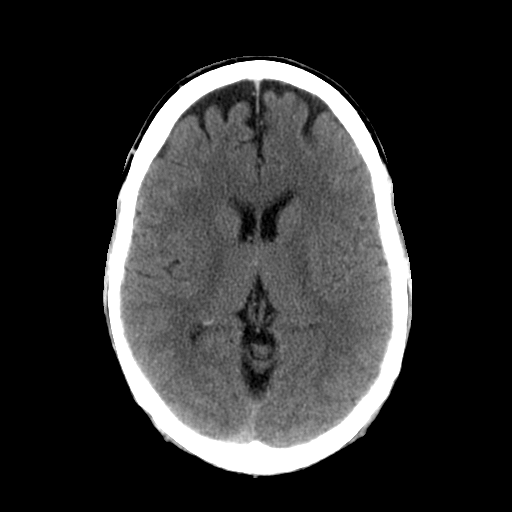
[im 18/32  brain]
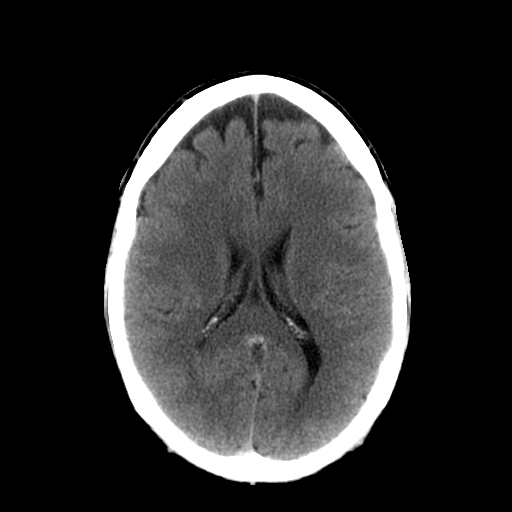
[im 20/32  brain]
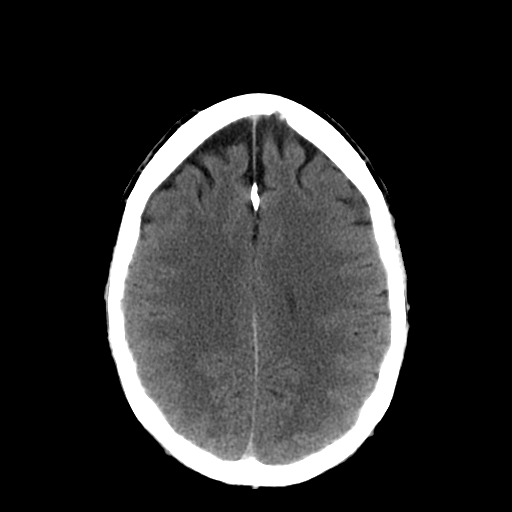
[im 20/32  bone]
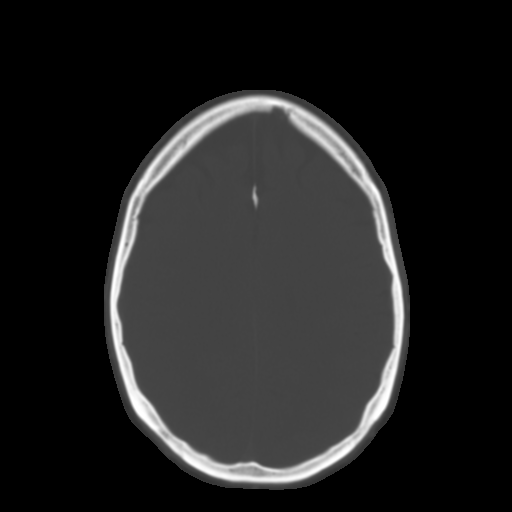
[im 23/32  brain]
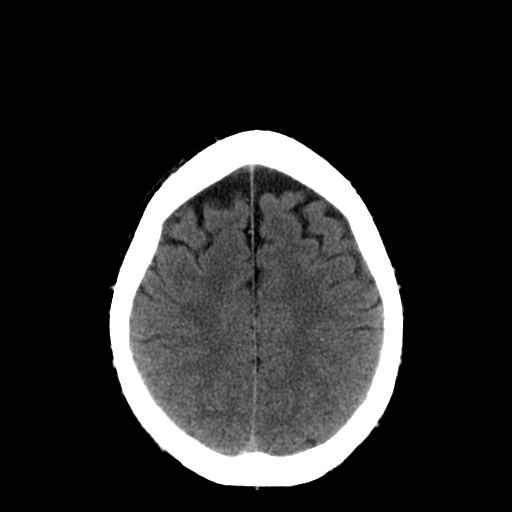
[im 25/32  brain]
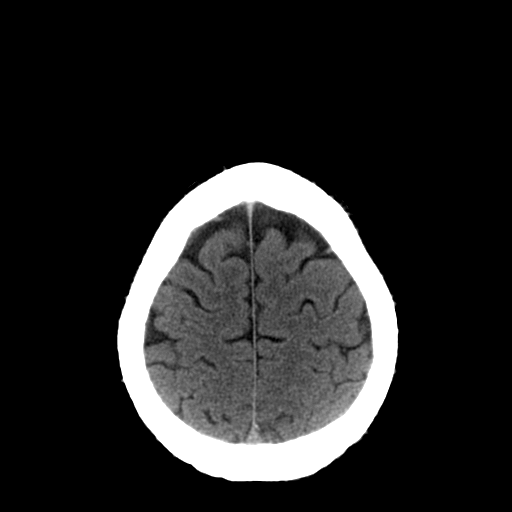
[im 27/32  brain]
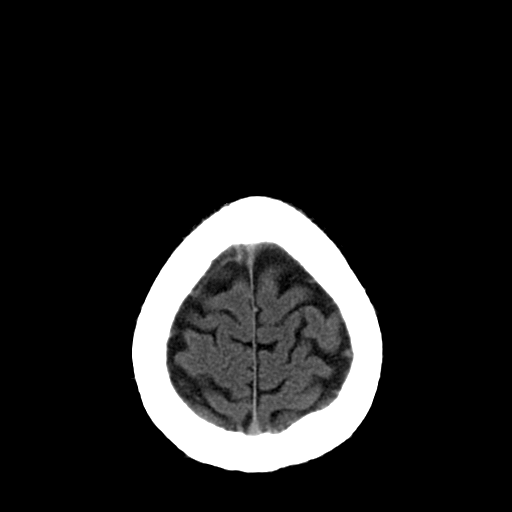
[im 29/32  brain]
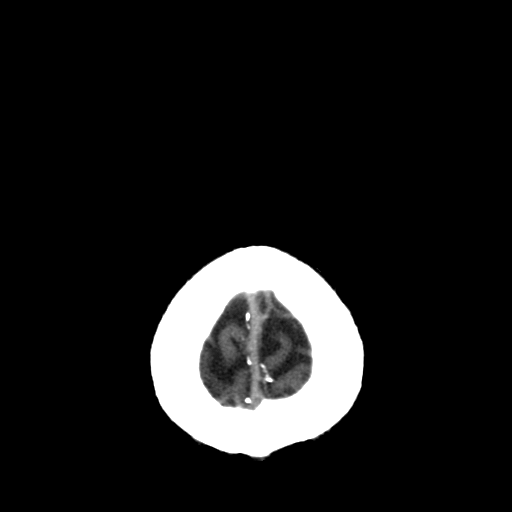
[im 29/32  bone]
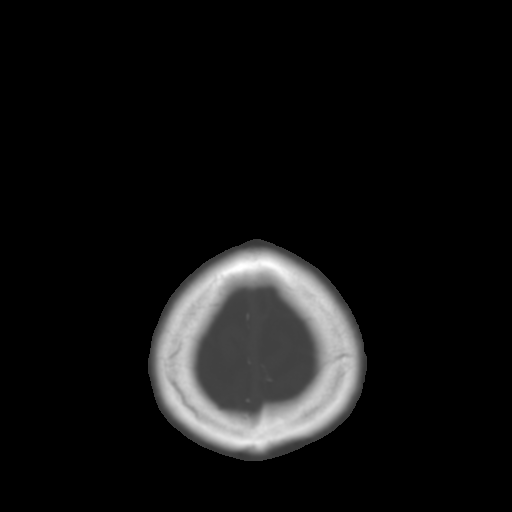

[13 of 30 positions shown; findings below may reference images not displayed]

CT HEAD WITHOUT CONTRAST:

Routine noncontrast CT head without priors for comparison.

Generalized atrophy.
Normal ventricular morphology.
No midline shift or mass-effect.
Small hyperdense focus noted at right temporal lobe image 13, question vessels
deep within right sylvian fissure.
No definite mass, hemorrhage, or infarct.
Deformity posterior aspect medial wall left orbit unchanged since prior sinus CT
of 01/04/2006.
No acute bone lesion.
IMPRESSION: Tiny hyperdensity right temporal lobe may represent vessels deep within right
sylvian fissure recess.
No definite acute intracranial abnormalities.
If symptoms persist consider followup MR brain.

## 2009-01-04 IMAGING — CR DG CHEST 2V
2 series · 2 of 2 positions shown · non-contrast
Comparison: none

HISTORY: SIRS

CHEST 2 VIEWS:
Comparison 12/01/2005
Normal heart size, mediastinal contours, and pulmonary vascularity.
Minimal chronic peribronchial thickening.
Minimal bibasilar atelectasis.
Question right nipple shadow.
No pleural effusion, pneumothorax, or focal bone abnormality.

[w chest pa]
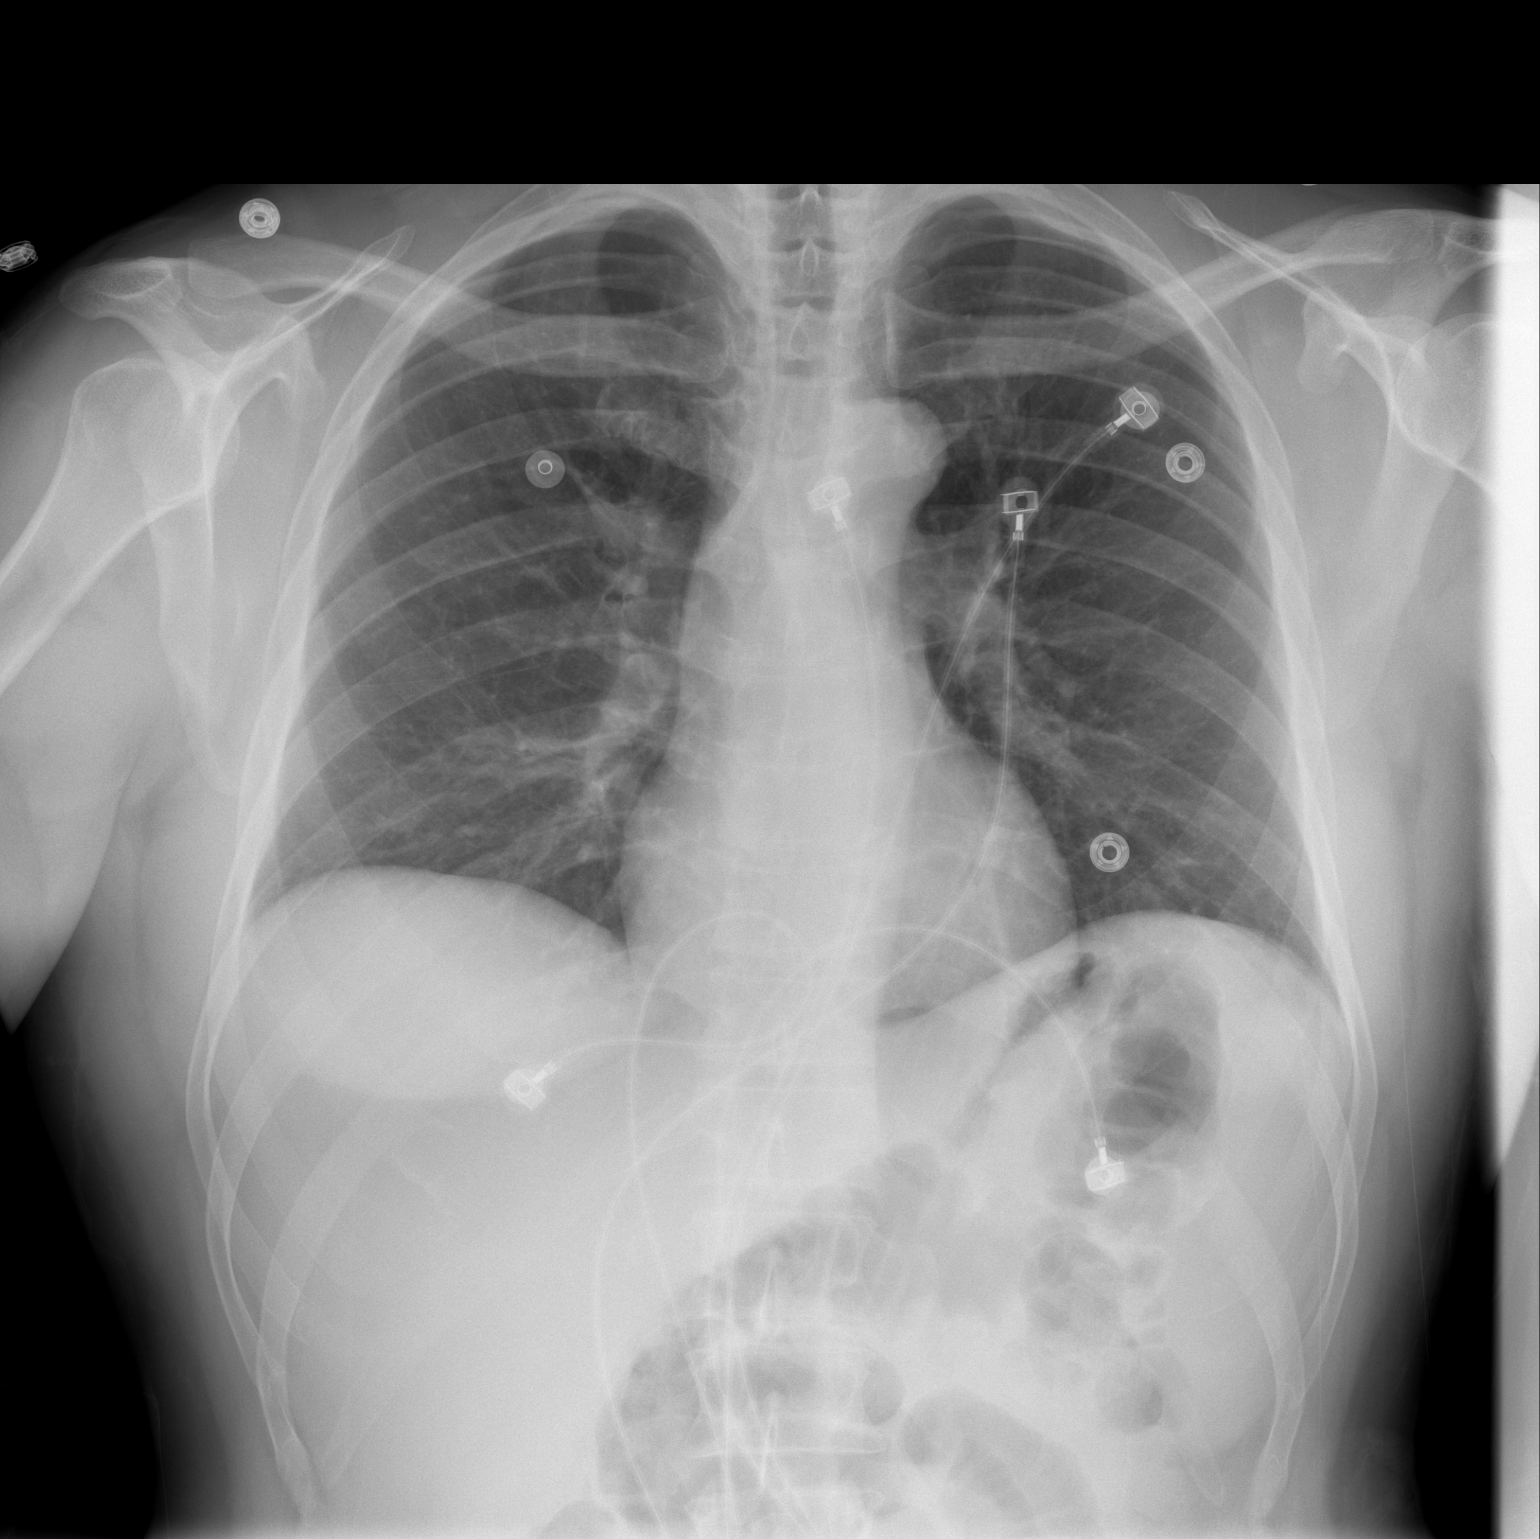

[w chest lat]
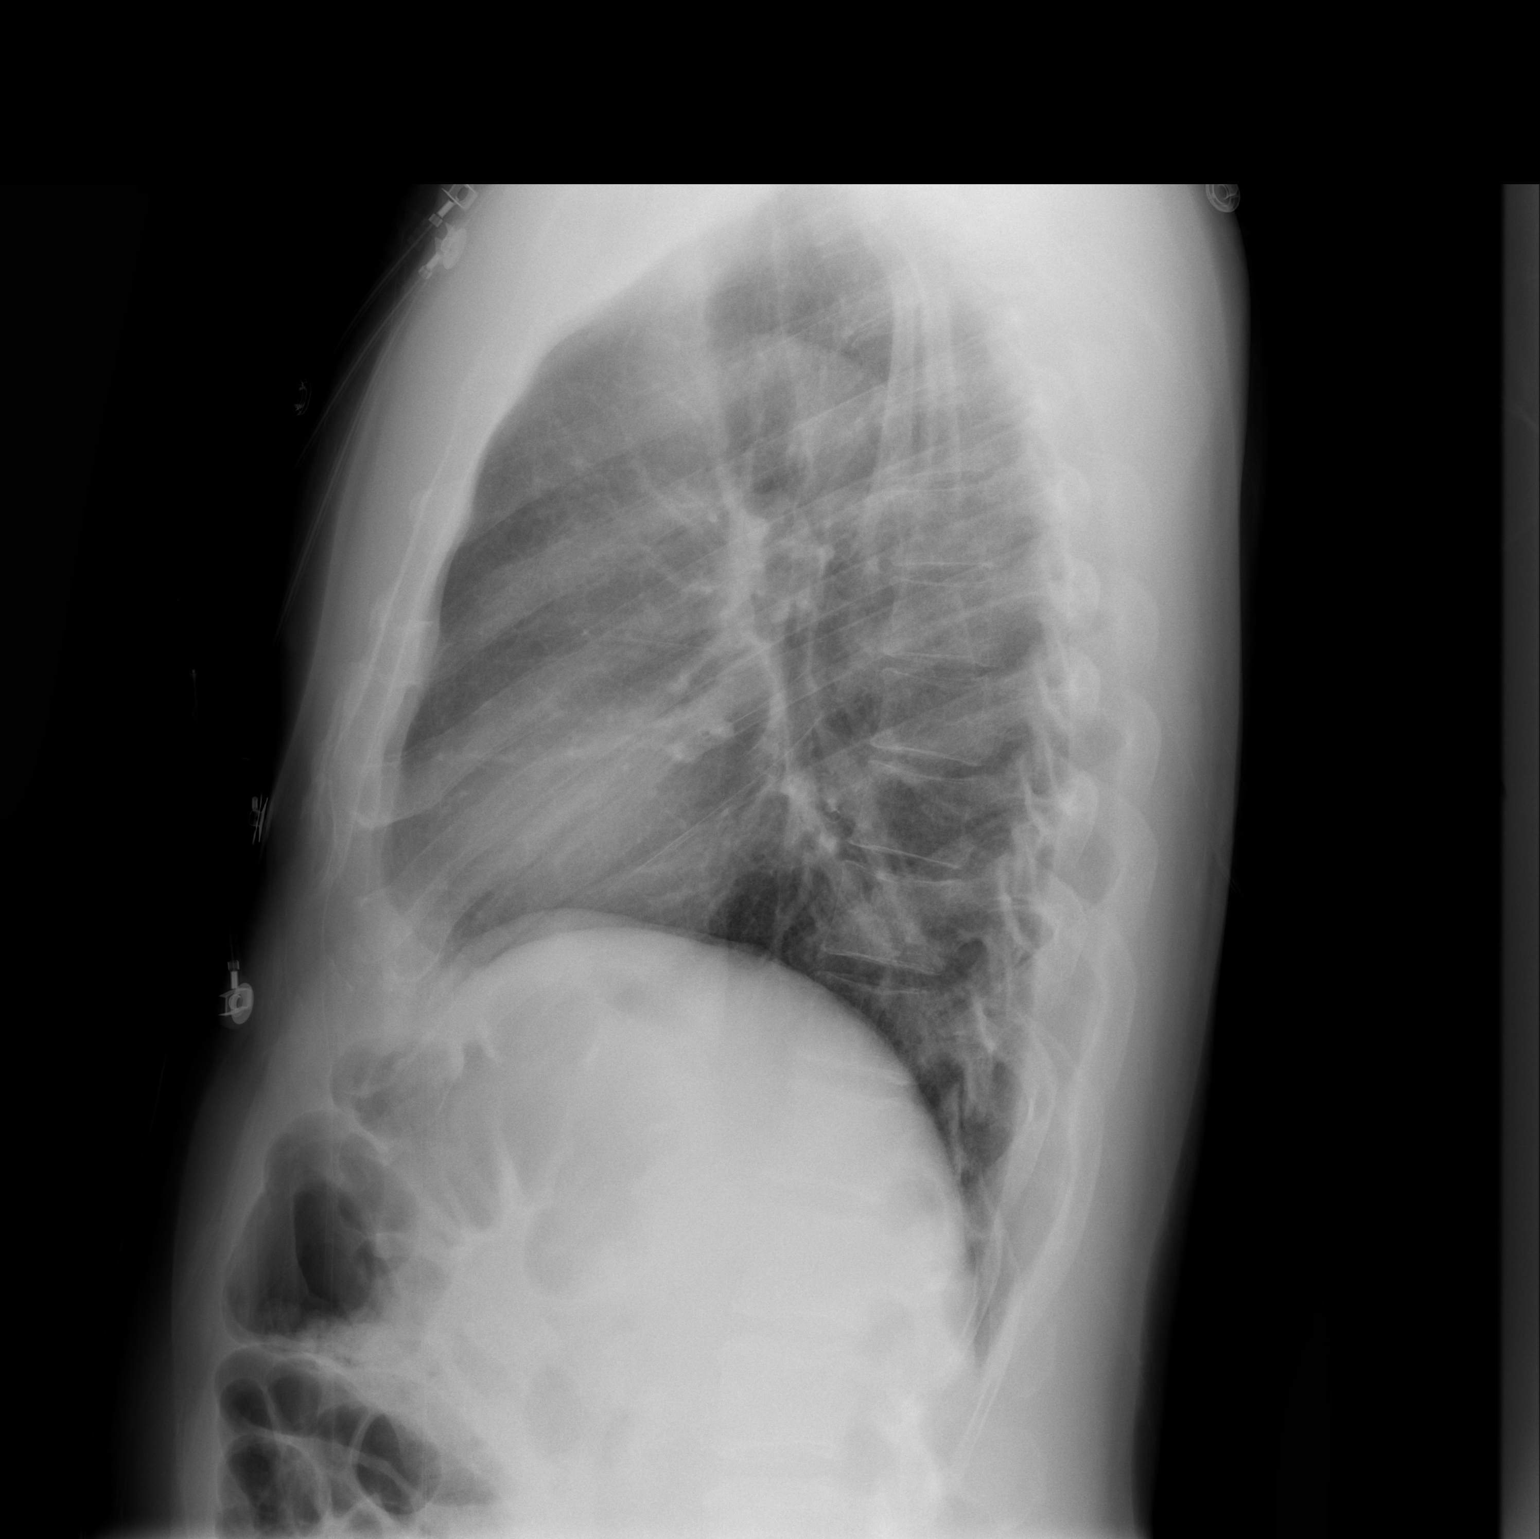

[2 of 2 positions shown; findings below may reference images not displayed]

IMPRESSION: Minimal bronchitic changes and bibasilar atelectasis.
Question right nipple shadow.
Repeat PA chest radiograph with nipple markers recommended.

## 2009-01-05 ENCOUNTER — Ambulatory Visit: Payer: Self-pay | Admitting: Internal Medicine

## 2009-01-05 DIAGNOSIS — B354 Tinea corporis: Secondary | ICD-10-CM | POA: Insufficient documentation

## 2009-01-19 ENCOUNTER — Telehealth (INDEPENDENT_AMBULATORY_CARE_PROVIDER_SITE_OTHER): Payer: Self-pay | Admitting: *Deleted

## 2009-02-14 ENCOUNTER — Telehealth (INDEPENDENT_AMBULATORY_CARE_PROVIDER_SITE_OTHER): Payer: Self-pay | Admitting: *Deleted

## 2009-03-16 ENCOUNTER — Telehealth (INDEPENDENT_AMBULATORY_CARE_PROVIDER_SITE_OTHER): Payer: Self-pay | Admitting: *Deleted

## 2009-04-13 ENCOUNTER — Encounter (INDEPENDENT_AMBULATORY_CARE_PROVIDER_SITE_OTHER): Payer: Self-pay | Admitting: *Deleted

## 2009-04-28 ENCOUNTER — Telehealth: Payer: Self-pay | Admitting: Internal Medicine

## 2009-06-05 ENCOUNTER — Telehealth: Payer: Self-pay | Admitting: Internal Medicine

## 2009-07-07 ENCOUNTER — Telehealth: Payer: Self-pay | Admitting: Internal Medicine

## 2009-08-05 ENCOUNTER — Telehealth: Payer: Self-pay | Admitting: Internal Medicine

## 2009-09-02 ENCOUNTER — Telehealth: Payer: Self-pay | Admitting: Internal Medicine

## 2009-09-09 ENCOUNTER — Ambulatory Visit: Payer: Self-pay | Admitting: Internal Medicine

## 2009-09-09 LAB — CONVERTED CEMR LAB
ALT: 45 units/L (ref 0–53)
AST: 32 units/L (ref 0–37)
Albumin: 4.3 g/dL (ref 3.5–5.2)
Basophils Absolute: 0 10*3/uL (ref 0.0–0.1)
Basophils Relative: 0 % (ref 0–1)
Calcium: 9.4 mg/dL (ref 8.4–10.5)
Chloride: 104 meq/L (ref 96–112)
HIV 1 RNA Quant: <20 copies/mL (ref <48)
MCHC: 34.7 g/dL (ref 30.0–36.0)
Monocytes Relative: 10 % (ref 3–12)
Neutro Abs: 6.5 10*3/uL (ref 1.7–7.7)
Neutrophils Relative %: 50 % (ref 43–77)
Potassium: 4.3 meq/L (ref 3.5–5.3)
RBC: 3.49 M/uL — ABNORMAL LOW (ref 4.22–5.81)
RDW: 13.3 % (ref 11.5–15.5)
Total Protein: 7.1 g/dL (ref 6.0–8.3)

## 2009-09-24 ENCOUNTER — Ambulatory Visit: Payer: Self-pay | Admitting: Internal Medicine

## 2009-09-25 LAB — CONVERTED CEMR LAB
Chlamydia, Swab/Urine, PCR: NEGATIVE
GC Probe Amp, Urine: NEGATIVE

## 2009-09-28 ENCOUNTER — Telehealth: Payer: Self-pay | Admitting: Internal Medicine

## 2009-10-05 ENCOUNTER — Telehealth: Payer: Self-pay | Admitting: Internal Medicine

## 2009-10-06 ENCOUNTER — Telehealth: Payer: Self-pay | Admitting: Internal Medicine

## 2009-10-23 ENCOUNTER — Telehealth (INDEPENDENT_AMBULATORY_CARE_PROVIDER_SITE_OTHER): Payer: Self-pay | Admitting: Licensed Clinical Social Worker

## 2009-11-17 ENCOUNTER — Ambulatory Visit: Payer: Self-pay | Admitting: Adult Health

## 2009-11-17 ENCOUNTER — Encounter: Payer: Self-pay | Admitting: Internal Medicine

## 2009-12-16 ENCOUNTER — Encounter (INDEPENDENT_AMBULATORY_CARE_PROVIDER_SITE_OTHER): Payer: Self-pay | Admitting: *Deleted

## 2009-12-18 ENCOUNTER — Telehealth (INDEPENDENT_AMBULATORY_CARE_PROVIDER_SITE_OTHER): Payer: Self-pay | Admitting: *Deleted

## 2010-01-31 LAB — CONVERTED CEMR LAB
Cholesterol: 222 mg/dL — ABNORMAL HIGH (ref 0–200)
HDL: 63 mg/dL (ref >39)
Platelets: 210 10*3/uL (ref 150–400)
RBC: 4.7 M/uL (ref 4.22–5.81)
Streptococcus, Group A Screen (Direct): NEGATIVE
Total CHOL/HDL Ratio: 3.5
VLDL: 32 mg/dL (ref 0–40)
WBC: 17.4 10*3/uL — ABNORMAL HIGH (ref 4.0–10.5)

## 2010-02-04 NOTE — Progress Notes (Signed)
  Phone Note Call from Patient   Caller: Patient Call For: Yisroel Ramming MD Summary of Call: Patient states that his erection doesn't last an hour, willing to take a chance. He states he doesnt use Viagra all the time, he feels like he wasted his money when his erection goes away fast. Initial call taken by: Starleen Arms CMA,  October 06, 2009 3:34 PM  Follow-up for Phone Call        I can give him something else but from a medical safety stand point I can't prescribe the higher dose. Follow-up by: Yisroel Ramming MD,  October 06, 2009 4:05 PM  Additional Follow-up for Phone Call Additional follow up Details #1::        tried to contact the patient , he did not answer and his voicemail is not set up. Additional Follow-up by: Starleen Arms CMA,  October 07, 2009 11:47 AM

## 2010-02-04 NOTE — Progress Notes (Signed)
Summary: SPAP meds arrived for Aug--Couldn't reach pt. Need update #  Phone Note Refill Request      Prescriptions: KALETRA 200-50 MG TABS (LOPINAVIR-RITONAVIR) two pills twice a day  #120 x 0   Entered by:   Paulo Fruit  BS,CPht II,MPH   Authorized by:   Yisroel Ramming MD   Signed by:   Paulo Fruit  BS,CPht II,MPH on 08/05/2009   Method used:   Samples Given   RxID:   [id number] TRUVADA 200-300 MG TABS (EMTRICITABINE-TENOFOVIR) one pill a day  #30 x 0   Entered by:   Paulo Fruit  BS,CPht II,MPH   Authorized by:   Yisroel Ramming MD   Signed by:   Paulo Fruit  BS,CPht II,MPH on 08/05/2009   Method used:   Samples Given   RxID:   [id number]  Patient Assist Medication Verification: Medication name:Truvada RX # [id number] Tech approval:MLD  Patient Assist Medication Verification: Medication name:Kaletra 200/50mg  RX # [id number] Tech approval:MLD  Tried to contact patient.  Unable to reach patient.  Need update phone number.  Number no longer works. Paulo Fruit  BS,CPht II,MPH  August 05, 2009 3:07 PM

## 2010-02-04 NOTE — Progress Notes (Signed)
Summary: SPAP/pt assist meds arrived for Jan  Phone Note Refill Request      Prescriptions: KALETRA 200-50 MG TABS (LOPINAVIR-RITONAVIR) two pills twice a day  #120 x 0   Entered by:   Paulo Fruit  BS,CPht II,MPH   Authorized by:   Yisroel Ramming MD   Signed by:   Paulo Fruit  BS,CPht II,MPH on 01/19/2009   Method used:   Samples Given   RxID:   [id number] TRUVADA 200-300 MG TABS (EMTRICITABINE-TENOFOVIR) one pill a day  #30 x 0   Entered by:   Paulo Fruit  BS,CPht II,MPH   Authorized by:   Yisroel Ramming MD   Signed by:   Paulo Fruit  BS,CPht II,MPH on 01/19/2009   Method used:   Samples Given   RxID:   [id number]   Patient Assist Medication Verification: Medication: Kaletra 200/50mg  XLK#[id number] Exp Date:25 Aug 2011 Tech approval:MLD                Patient Assist Medication Verification: Medication:Truvada OZD#[id number] Exp Date:06 2014 Tech approval:MLD Call placed to patient with message that assistance medications are ready for pick-up. Left message Paulo Fruit  BS,CPht II,MPH  January 19, 2009 9:55 AM

## 2010-02-04 NOTE — Miscellaneous (Signed)
  Clinical Lists Changes   Orders: Added new Service order of Est. Patient Level III (99213) - Signed 

## 2010-02-04 NOTE — Assessment & Plan Note (Signed)
Summary: per dr butcher/f/u rash/pcp-vollmer/hla   Vital Signs:  Patient profile:   50 year old male Height:      72 inches (182.88 cm) Weight:      184.5 pounds (80.86 kg) BMI:     24.21 Temp:     97.1 degrees F (36.17 degrees C) oral Pulse rate:   114 / minute BP sitting:   130 / 83  (left arm) Cuff size:   REGUALR  Vitals Entered By: Theotis Barrio NT II (January 05, 2009 11:32 AM) CC: RASH IWTH ITCHING RESTARTED BACK WITH ITCING / SWELLING ? INFECTION LEFT LOWER CHIN AREA-ABOUT 2 DAYS AGO Is Patient Diabetic? No Pain Assessment Patient in pain? yes     Location: LEFT LOWER CHIN AREA Intensity:        3 Type: aching Onset of pain  ABOUT 2 DAYS AGO Nutritional Status BMI of 19 -24 = normal  Have you ever been in a relationship where you felt threatened, hurt or afraid?No   Does patient need assistance? Functional Status Self care Ambulation Normal Comments WANT THREE OF THE MED TO BE  RESTARTED.   Primary Care Provider:  Yisroel Ramming MD  CC:  RASH IWTH ITCHING RESTARTED BACK WITH ITCING / SWELLING ? INFECTION LEFT LOWER CHIN AREA-ABOUT 2 DAYS AGO.  History of Present Illness: 50 yo man with HIV followed by Dr. Philipp Deputy.  Patient seen in our clinic 12-22-2008 at which time he says he had swelling on his left face after he had an ingrown hair on his left chin and that he took an unclean pin and helped pulled out the ingrow hair. He then had swelling in this region. He says that he was given amoxicillin and took at least a 10 day supply of this. He says that the swelling went down initially but that it has now returned after stopping antibiotics. He says that he has pus coming out of this area. He has been using hot compresses.  No fevers, chills, pain in the area.   The patient also has had a rash that started  ~ 3 months ago. It has come and gone over that time. It is very itchy. He was given prednisone and hydroxyzine at last clinic visit and that helped. He is out of  predisone and hydroxyzine now and says that the itching is back. It can occur anywhere on his body. The itching is not always associated with a rash. He says he has no "allergy" problems and does not have a history of such rashes. He is now using All-Clear detergent [he switched from Tide]. He is using Dial for men soap. He says that he has not started any new medications. He has not changed his diet. He has not seen a dermatologist.   HIV: Last CD4 1030 09/2008. Has fu with ID doctor Dr. Philipp Deputy later this month 01-2009.        Preventive Screening-Counseling & Management  Alcohol-Tobacco     Alcohol drinks/day: <1     Alcohol type: socially, all types     Smoking Status: never     Passive Smoke Exposure: no  Caffeine-Diet-Exercise     Caffeine use/day: 2     Does Patient Exercise: yes     Type of exercise: gym     Times/week: 3-4  Current Medications (verified): 1)  Truvada 200-300 Mg Tabs (Emtricitabine-Tenofovir) .... One Pill A Day 2)  Kaletra 200-50 Mg Tabs (Lopinavir-Ritonavir) .... Two Pills Twice A Day 3)  Ambien  10 Mg  Tabs (Zolpidem Tartrate) .... Take 1 Tablet By Mouth At Bedtime 4)  Vitamin B12 .Marland Kitchen.. Im Q Month 5)  Ensure  Liqd (Nutritional Supplements) .... One Case Per Month 6)  Nasonex 50 Mcg/act Susp (Mometasone Furoate) .... Apply 2 Sprays in Each Nostril At Bedtime. 7)  Viagra 50 Mg Tabs (Sildenafil Citrate) .... Take One Tab By Mouth 30 Minutes Before Planned Sexual Activity 8)  Vesicare 10 Mg Tabs (Solifenacin Succinate) .... Use As Directed By Urology 9)  Triamcinolone Acetonide 0.1 % Crea (Triamcinolone Acetonide) .... Apply Two Times A Day 10)  Claritin 10 Mg Tabs (Loratadine) .... Take 1 Tablet By Mouth Once A Day 11)  Hydroxyzine Hcl 50 Mg Tabs (Hydroxyzine Hcl) .... Take One Tablet Four Times A Day 12)  Doxycycline Hyclate 100 Mg Caps (Doxycycline Hyclate) .... Take 1 Tablet By Mouth Twice A Day. Please Take For At Least 10 Days  Allergies  (verified): 1)  ! Sulfa 2)  ! Bactrim Ds (Sulfamethoxazole-Trimethoprim)  Past History:  Past Medical History: Last updated: 01/28/2008 HIV disease - dx 1994 prior gonnorhea genital herpes syphilis  Review of Systems       The patient complains of suspicious skin lesions.  The patient denies anorexia, fever, weight loss, weight gain, vision loss, decreased hearing, hoarseness, chest pain, syncope, dyspnea on exertion, peripheral edema, prolonged cough, headaches, hemoptysis, abdominal pain, melena, hematochezia, severe indigestion/heartburn, hematuria, incontinence, genital sores, muscle weakness, transient blindness, difficulty walking, depression, unusual weight change, and abnormal bleeding.         See HPI.   Physical Exam  General:  alert, well-developed, well-nourished, and well-hydrated.   Head:  normocephalic and atraumatic.   Eyes:  vision grossly intact, pupils equal, pupils round, and pupils reactive to light.   Ears:  R ear normal and L ear normal.   Nose:  no external deformity.   Lungs:  normal breath sounds.   Heart:  normal rate, regular rhythm, and no murmur.   Abdomen:  soft, non-tender, and normal bowel sounds.   Neurologic:  alert & oriented X3.   Skin:  Patient has a several CM indurated, swollen area on the left cheek near the chin. He has excoriations over this area. There is currently no drainage. It is not tender to palpation.  He also has a 2.5X1.75 cm patch with raised scaling rim on the right cheek.   Patient has several areas of small papules that itch. He has one such area that he shows me on the right inner forearm near the wrist.    Impression & Recommendations:  Problem # 1:  CELLULITIS AND ABSCESS OF FACE (ICD-682.0) Patient has resolution of his symptoms to a degree on amoxicillin but it recurred when abx stopped. There is a several CM indurated area on the cheek near the chin. This looks like it needs minor drainage for cure, but I am not  willing to do this in the office as it is on his face and I do not want to disfugure him. I think he needs a more professional approach from an expert that is used to surgical procedures in such areas. General surgery was called and said that we should call dermatology. We will refer. In the meantime I think he needs to be continued on suppressive antibiotics. I am changing him to doxy to get MRSA coverage. We cannot use Bactrim in him as he has an allergy and G6PD deficiency. I have instructed him to call the  cliic or go to the ED if he has fevers, chills, or worsening of this area. At this moment he is afebrile, stable BP, no signs of systemic illness.   The following medications were removed from the medication list:    Amoxicillin 500 Mg Caps (Amoxicillin) .Marland Kitchen... Three times a day His updated medication list for this problem includes:    Doxycycline Hyclate 100 Mg Caps (Doxycycline hyclate) .Marland Kitchen... Take 1 tablet by mouth twice a day. please take for at least 10 days or until you are seen with the dermatologist.  Problem # 2:  TINEA CORPORIS (ICD-110.5) On right cheek he has a raised scaling patch that looks consistent with tinea. Have prescribed clotrimazole cream for now.   Problem # 3:  SKIN RASH (ICD-782.1)  Patient has small papules that itch as well as general pruritis in areas where there are no obvious skin findings. He has shown improvement with prednisone as hydroxyzie, but the symptoms return once these are stopped. As he is being referred to dermatology soon I will refill hydroxyzine and have him seen by a dermatologist.  In the meantime he has changed detergent. I have asked that he change his soap from Dial to Elysian.   His updated medication list for this problem includes:    Triamcinolone Acetonide 0.1 % Crea (Triamcinolone acetonide) .Marland Kitchen... Apply two times a day    Clotrimazole 1 % Crea (Clotrimazole) .Marland Kitchen... Please apply twice daily to right cheek for 4 weeks or until area  clears.  Orders: Dermatology Referral (Derma)  Problem # 4:  HIV DISEASE (ICD-042) Last CD4 >1000 in 09/2008. Has fu appointment with Dr. Philipp Deputy later this month.   Complete Medication List: 1)  Truvada 200-300 Mg Tabs (Emtricitabine-tenofovir) .... One pill a day 2)  Kaletra 200-50 Mg Tabs (Lopinavir-ritonavir) .... Two pills twice a day 3)  Ambien 10 Mg Tabs (Zolpidem tartrate) .... Take 1 tablet by mouth at bedtime 4)  Vitamin B12  .Marland Kitchen.. im q month 5)  Ensure Liqd (Nutritional supplements) .... One case per month 6)  Nasonex 50 Mcg/act Susp (Mometasone furoate) .... Apply 2 sprays in each nostril at bedtime. 7)  Viagra 50 Mg Tabs (Sildenafil citrate) .... Take one tab by mouth 30 minutes before planned sexual activity 8)  Vesicare 10 Mg Tabs (Solifenacin succinate) .... Use as directed by urology 9)  Triamcinolone Acetonide 0.1 % Crea (Triamcinolone acetonide) .... Apply two times a day 10)  Claritin 10 Mg Tabs (Loratadine) .... Take 1 tablet by mouth once a day 11)  Hydroxyzine Hcl 50 Mg Tabs (Hydroxyzine hcl) .... Take one tablet four times a day 12)  Doxycycline Hyclate 100 Mg Caps (Doxycycline hyclate) .... Take 1 tablet by mouth twice a day. please take for at least 10 days or until you are seen with the dermatologist. 13)  Clotrimazole 1 % Crea (Clotrimazole) .... Please apply twice daily to right cheek for 4 weeks or until area clears.  Patient Instructions: 1)  We will call you with an appointment at [facility] for evaluation of your itchy rash as well as the infection on your left cheek. Prescriptions: HYDROXYZINE HCL 50 MG TABS (HYDROXYZINE HCL) Take one tablet four times a day  #60 x 0   Entered and Authorized by:   Aris Lot MD   Signed by:   Aris Lot MD on 01/05/2009   Method used:   Print then Give to Patient   RxID:   [id number] DOXYCYCLINE HYCLATE 100 MG CAPS (  DOXYCYCLINE HYCLATE) Take 1 tablet by mouth twice a day.  Please take for at least 10 days or until you are seen with the dermatologist.  #28 x 0   Entered and Authorized by:   Aris Lot MD   Signed by:   Aris Lot MD on 01/05/2009   Method used:   Print then Give to Patient   RxID:   [id number] CLOTRIMAZOLE 1 % CREA (CLOTRIMAZOLE) Please apply twice daily to right cheek for 4 weeks or until area clears.  #30 gram x 2   Entered and Authorized by:   Aris Lot MD   Signed by:   Aris Lot MD on 01/05/2009   Method used:   Print then Give to Patient   RxID:   [id number]

## 2010-02-04 NOTE — Progress Notes (Signed)
  Phone Note Call from Patient   Caller: Patient Call For: Yisroel Ramming MD Summary of Call: Patient wants to change from viagra 50 to 100, 50mg  not working anymore Initial call taken by: Starleen Arms CMA,  October 05, 2009 5:03 PM  Follow-up for Phone Call        not safe to do that due to interaction with his HIV meds Follow-up by: Yisroel Ramming MD,  October 06, 2009 3:19 PM  Additional Follow-up for Phone Call Additional follow up Details #1::        ok Additional Follow-up by: Starleen Arms CMA,  October 06, 2009 3:24 PM

## 2010-02-04 NOTE — Progress Notes (Signed)
Summary: ADAP meds arrived, pt. notified  Phone Note Outgoing Call   Call placed by: Annice Pih Summary of Call: ADAP meds ready for pick up, pt. notified Initial call taken by: Wendall Mola CMA Duncan Dull),  December 18, 2009 2:17 PM

## 2010-02-04 NOTE — Progress Notes (Signed)
Summary: [facility]/pt assist meds arrived for FEb  Phone Note Refill Request      Prescriptions: KALETRA 200-50 MG TABS (LOPINAVIR-RITONAVIR) two pills twice a day  #120 x 0   Entered by:   Paulo Fruit  BS,CPht II,MPH   Authorized by:   Yisroel Ramming MD   Signed by:   Paulo Fruit  BS,CPht II,MPH on 02/14/2009   Method used:   Samples Given   RxID:   [id number] TRUVADA 200-300 MG TABS (EMTRICITABINE-TENOFOVIR) one pill a day  #30 x 0   Entered by:   Paulo Fruit  BS,CPht II,MPH   Authorized by:   Yisroel Ramming MD   Signed by:   Paulo Fruit  BS,CPht II,MPH on 02/14/2009   Method used:   Samples Given   RxID:   [id number]   Patient Assist Medication Verification: Medication: Kaletra 200/50mg  QIO#[id number] Exp Date:31 Oct 2011 Tech approval:MLD                Patient Assist Medication Verification: Medication:Truvada Lot# [id number] Exp Date:07 2014 Tech approval:MLD Will call pt on Monday, 02/16/09 Paulo Fruit  BS,CPht II,MPH  February 14, 2009 10:49 AM

## 2010-02-04 NOTE — Miscellaneous (Signed)
Summary: Orders Update  Clinical Lists Changes  Orders: Added new Test order of T-RPR (Syphilis) [phone]) - Signed

## 2010-02-04 NOTE — Assessment & Plan Note (Signed)
Summary: PER DENISE ISSUE? [MKJ]   Primary Provider:  Yisroel Ramming MD  CC:  possible chlamydia .  History of Present Illness: Receptive anal intercourse without condom over weekend.  Partner told him he had gonorrhea or chlamydia (not certain).  He denies any rectal symptoms, but does c/o change in urine color and difficulty urinating.  Denies having rectal insertive intercourse himself but doe relate receptive oral sex with partner.  Preventive Screening-Counseling & Management  Alcohol-Tobacco     Alcohol drinks/day: <1     Alcohol type: socially, all types     Smoking Status: never     Passive Smoke Exposure: no   Current Allergies (reviewed today): ! SULFA ! BACTRIM DS (SULFAMETHOXAZOLE-TRIMETHOPRIM) Review of Systems      See HPI GU:  See HPI; Complains of dysuria; denies decreased libido, discharge, erectile dysfunction, genital sores, hematuria, incontinence, nocturia, urinary frequency, and urinary hesitancy.  Vital Signs:  Patient profile:   50 year old male Height:      72 inches (182.88 cm) Weight:      183.50 pounds (83.41 kg) BMI:     24.98 Temp:     98.1 degrees F (36.72 degrees C) oral Pulse rate:   96 / minute BP sitting:   144 / 102  (left arm)  Vitals Entered By: Starleen Arms CMA (November 17, 2009 2:19 PM) CC: possible chlamydia  Is Patient Diabetic? No Pain Assessment Patient in pain? no      Nutritional Status BMI of 19 -24 = normal Nutritional Status Detail nl  Does patient need assistance? Functional Status Self care Ambulation Normal   Physical Exam  General:  Well-developed,well-nourished,in no acute distress; alert,appropriate and cooperative throughout examination Mouth:  Oral mucosa and oropharynx without lesions or exudates.  Teeth in good repair. Rectal:  No external abnormalities noted. Normal sphincter tone. No rectal masses or tenderness. Genitalia:  Testes bilaterally descended without nodularity, tenderness or masses. No  scrotal masses or lesions. No penis lesions or urethral discharge. Inguinal Nodes:  Shoddy intrainguinal LN's   Impression & Recommendations:  Problem # 1:  SEXUAL ACTIVITY, HIGH RISK (ICD-V69.2) Suspect GC/Chlamydia exposure Labs as ordered Rocephin 250 mg IM Doxycycline 100mg  two times a day for 14 days Abstain from sexual activity for next two weeks Safer sex practices reviewed Follow-up with Dr. Philipp Deputy as scheduled or PRN Orders: T-Chlamydia  Probe, urine [phone]) T-GC Probe, urine [id number]) T-RPR (Syphilis) ([id number]) T-Urinalysis Dipstick only (57846NG) Prescriptions: DOXYCYCLINE HYCLATE 100 MG CAPS (DOXYCYCLINE HYCLATE) Take 1 tablet by mouth twice a day. Please take for at least 10 days or until you are seen with the dermatologist.  #28 x 0   Entered and Authorized by:   Talmadge Chad NP   Signed by:   Talmadge Chad NP on 11/17/2009   Method used:   Print then Give to Patient   RxID:   [id number]   Appended Document: PER DENISE ISSUE? [MKJ]    Clinical Lists Changes  Orders: Added new Service order of Rocephin  250mg  ([id number]) - Signed       Medication Administration  Injection # 1:    Medication: Rocephin  250mg     Diagnosis: SEXUAL ACTIVITY, HIGH RISK (ICD-V69.2)    Route: IM    Site: LUOQ gluteus    Exp Date: 06/2010    Lot #: [id number]    Mfr: [email or web address]    Patient tolerated injection without complications    Given by: Starleen Arms CMA (November 17, 2009 5:13 PM)  Orders Added: 1)  Rocephin  250mg  [[id number]]

## 2010-02-04 NOTE — Progress Notes (Signed)
Summary: Pt. assist meds arrived via [facility]) for June  Phone Note Refill Request      Prescriptions: KALETRA 200-50 MG TABS (LOPINAVIR-RITONAVIR) two pills twice a day  #120 x 0   Entered by:   Paulo Fruit  BS,CPht II,MPH   Authorized by:   Yisroel Ramming MD   Signed by:   Paulo Fruit  BS,CPht II,MPH on 06/05/2009   Method used:   Samples Given   RxID:   [id number] TRUVADA 200-300 MG TABS (EMTRICITABINE-TENOFOVIR) one pill a day  #30 x 0   Entered by:   Paulo Fruit  BS,CPht II,MPH   Authorized by:   Yisroel Ramming MD   Signed by:   Paulo Fruit  BS,CPht II,MPH on 06/05/2009   Method used:   Samples Given   RxID:   [id number]  Patient Assist Medication Verification: Medication name: Truvada RX # [id number] Tech approval:MLD  Patient Assist Medication Verification: Medication name:Kaletra 200/50mg  RX # [id number] Tech approval:MLD Prescription/Samples picked up by: patient Paulo Fruit  BS,CPht II,MPH  June 05, 2009 3:25 PM

## 2010-02-04 NOTE — Progress Notes (Signed)
Summary: NcADAP/pt assist meds arrived for Mar  Phone Note Refill Request      Prescriptions: KALETRA 200-50 MG TABS (LOPINAVIR-RITONAVIR) two pills twice a day  #120 x 0   Entered by:   Paulo Fruit  BS,CPht II,MPH   Authorized by:   Yisroel Ramming MD   Signed by:   Paulo Fruit  BS,CPht II,MPH on 03/16/2009   Method used:   Samples Given   RxID:   [id number] TRUVADA 200-300 MG TABS (EMTRICITABINE-TENOFOVIR) one pill a day  #30 x 0   Entered by:   Paulo Fruit  BS,CPht II,MPH   Authorized by:   Yisroel Ramming MD   Signed by:   Paulo Fruit  BS,CPht II,MPH on 03/16/2009   Method used:   Samples Given   RxID:   [id number]   Patient Assist Medication Verification: Medication: Kaletra 200/50mg  QIO#[id number] Exp Date:15 Oct 2011 Tech approval:MLD                Patient Assist Medication Verification: Medication:Truvada Lot# [id number] Exp Date:09 2014 Tech approval:MLD Call placed to patient with message that assistance medications are ready for pick-up. Paulo Fruit  BS,CPht II,MPH  March 16, 2009 3:35 PM                   Appended Document: N[place]ADAP/pt assist meds arrived for Mar Prescription/Samples picked up by: patient

## 2010-02-04 NOTE — Miscellaneous (Signed)
Summary: clinical update/ryan white NCADAP apprv til 04/03/10  Clinical Lists Changes  Observations: Added new observation of AIDSDAP: Yes 2011 (04/13/2009 11:47)

## 2010-02-04 NOTE — Progress Notes (Signed)
Summary: ncadap/spap meds arrived for Aug--unable to reach pt  Phone Note Refill Request      Prescriptions: KALETRA 200-50 MG TABS (LOPINAVIR-RITONAVIR) two pills twice a day  #120 x 0   Entered by:   Paulo Fruit  BS,CPht II,MPH   Authorized by:   Yisroel Ramming MD   Signed by:   Paulo Fruit  BS,CPht II,MPH on 09/02/2009   Method used:   Samples Given   RxID:   [id number] TRUVADA 200-300 MG TABS (EMTRICITABINE-TENOFOVIR) one pill a day  #30 x 0   Entered by:   Paulo Fruit  BS,CPht II,MPH   Authorized by:   Yisroel Ramming MD   Signed by:   Paulo Fruit  BS,CPht II,MPH on 09/02/2009   Method used:   Samples Given   RxID:   [id number]  Patient Assist Medication Verification: Medication name: Truvada RX # [id number] Tech approval:MLD  Patient Assist Medication Verification: Medication name:Kaletra 200/50mg  RX # [id number] Tech approval:MLD  tried to contact patient; was unable to leave a message because he has not set up a voicemail box that will take messages.  Paulo Fruit  BS,CPht II,MPH  September 02, 2009 9:15 AM

## 2010-02-04 NOTE — Progress Notes (Signed)
Summary: SPAP/ADAP meds arriv via [employer] for Apr  Phone Note Refill Request      Prescriptions: KALETRA 200-50 MG TABS (LOPINAVIR-RITONAVIR) two pills twice a day  #120 x 0   Entered by:   Paulo Fruit  BS,CPht II,MPH   Authorized by:   Yisroel Ramming MD   Signed by:   Paulo Fruit  BS,CPht II,MPH on 04/28/2009   Method used:   Samples Given   RxID:   [id number] TRUVADA 200-300 MG TABS (EMTRICITABINE-TENOFOVIR) one pill a day  #30 x 0   Entered by:   Paulo Fruit  BS,CPht II,MPH   Authorized by:   Yisroel Ramming MD   Signed by:   Paulo Fruit  BS,CPht II,MPH on 04/28/2009   Method used:   Samples Given   RxID:   [id number]  Patient Assist Medication Verification: Medication name: Kaletra 200/50mg  RX # [id number] Tech approval:MLD  Patient Assist Medication Verification: Medication name:Truvada RX # [id number] Tech approval:MLD Call placed to patient with message that assistance medications are ready for pick-up. Paulo Fruit  BS,CPht II,MPH  April 28, 2009 4:57 PM

## 2010-02-04 NOTE — Assessment & Plan Note (Signed)
Summary: F/U/OV/VS   Primary Provider:  Yisroel Ramming MD  CC:  follow-up visit and lab result.  History of Present Illness: Pt doing well.  No missed doses of his HIV meds.  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  Preventive Screening-Counseling & Management  Alcohol-Tobacco     Alcohol drinks/day: <1     Alcohol type: socially, all types     Smoking Status: never     Passive Smoke Exposure: no  Caffeine-Diet-Exercise     Caffeine use/day: 2     Does Patient Exercise: yes     Type of exercise: gym     Times/week: 3-4  Hep-HIV-STD-Contraception     HIV Risk: yes  Safety-Violence-Falls     Seat Belt Use: 100      Sexual History:  dating.        Drug Use:  yes.    Comments: pt. given condoms   Updated Prior Medication List: TRUVADA 200-300 MG TABS (EMTRICITABINE-TENOFOVIR) one pill a day KALETRA 200-50 MG TABS (LOPINAVIR-RITONAVIR) two pills twice a day AMBIEN 10 MG  TABS (ZOLPIDEM TARTRATE) Take 1 tablet by mouth at bedtime * VITAMIN B12  IM q month ENSURE  LIQD (NUTRITIONAL SUPPLEMENTS) one case per month NASONEX 50 MCG/ACT SUSP (MOMETASONE FUROATE) apply 2 sprays in each nostril at bedtime. VIAGRA 50 MG TABS (SILDENAFIL CITRATE) Take one tab by mouth 30 minutes before planned sexual activity VESICARE 10 MG TABS (SOLIFENACIN SUCCINATE) use as directed by urology TRIAMCINOLONE ACETONIDE 0.1 % CREA (TRIAMCINOLONE ACETONIDE) apply two times a day CLARITIN 10 MG TABS (LORATADINE) Take 1 tablet by mouth once a day HYDROXYZINE HCL 50 MG TABS (HYDROXYZINE HCL) Take one tablet four times a day DOXYCYCLINE HYCLATE 100 MG CAPS (DOXYCYCLINE HYCLATE) Take 1 tablet by mouth twice a day. Please take for at least 10 days or until you are seen with the dermatologist. CLOTRIMAZOLE 1 % CREA (CLOTRIMAZOLE) Please apply twice daily to right cheek for 4 weeks or until area clears.  Current Allergies (reviewed today): !  SULFA ! BACTRIM DS (SULFAMETHOXAZOLE-TRIMETHOPRIM) Past History:  Past Medical History: Last updated: 01/28/2008 HIV disease - dx 1994 prior gonnorhea genital herpes syphilis  Social History: Sexual History:  dating  Review of Systems  The patient denies anorexia, fever, and weight loss.    Vital Signs:  Patient profile:   50 year old male Height:      72 inches (182.88 cm) Weight:      185.6 pounds (84.36 kg) BMI:     25.26 Temp:     98.1 degrees F (36.72 degrees C) oral Pulse rate:   80 / minute BP sitting:   111 / 78  (right arm)  Vitals Entered By: Wendall Mola CMA Duncan Dull) (September 24, 2009 4:12 PM) CC: follow-up visit, lab result Is Patient Diabetic? No Pain Assessment Patient in pain? no      Nutritional Status BMI of 25 - 29 = overweight Nutritional Status Detail appetite "good"  Does patient need assistance? Functional Status Self care Ambulation Normal Comments no missed doses of meds per pt.   Physical Exam  General:  alert, well-developed, well-nourished, and well-hydrated.   Head:  normocephalic and atraumatic.   Lungs:  normal breath sounds.      Impression & Recommendations:  Problem # 1:  HIV DISEASE (ICD-042) Pt.s most recent CD4ct was 1410 and VL <20 .  Pt instructed to continue the  current antiretroviral regimen.  Pt encouraged to take medication regularly and not miss doses.  Pt will f/u in 3 months for repeat blood work and will see me 2 weeks later. Influenza vaccine given.  Diagnostics Reviewed:  CD4: 1410 (09/10/2009)   WBC: 13.0 (09/09/2009)   Hgb: 12.2 (09/09/2009)   HCT: 35.2 (09/09/2009)   Platelets: 261 (09/09/2009) HIV-1 RNA: <20 copies/mL (09/09/2009)   HBSAg: NEG (03/08/2007)  Orders: T-GC Probe, urine ([id number]) T-Chlamydia  Probe, urine ([id number])  Problem # 2:  SEXUAL ACTIVITY, HIGH RISK (ICD-V69.2) check GC and chlamydia RPR negative condoms given  Other Orders: Influenza Vaccine NON MCR  (29562) Est. Patient Level III (13086) Future Orders: T-CD4SP ([facility]) (CD4SP) ... 03/23/2010 T-HIV Viral Load [phone]) ... 03/23/2010 T-Comprehensive Metabolic Panel [phone]) ... 03/23/2010 T-CBC w/Diff ([id number]) ... 03/23/2010  Patient Instructions: 1)  Please schedule a follow-up appointment in 6 months, 2 weeks after labs.    Immunizations Administered:  Influenza Vaccine # 1:    Vaccine Type: Fluvax Non-MCR    Site: right deltoid    Mfr: Novartis    Dose: 0.5 ml    Route: IM    Given by: Wendall Mola CMA ( AAMA)    Exp. Date: 04/04/2010    Lot #: [id number]    VIS given: 07/27/06 version given September 24, 2009.  Flu Vaccine Consent Questions:    Do you have a history of severe allergic reactions to this vaccine? no    Any prior history of allergic reactions to egg and/or gelatin? no    Do you have a sensitivity to the preservative Thimersol? no    Do you have a past history of Guillan-Barre Syndrome? no    Do you currently have an acute febrile illness? no    Have you ever had a severe reaction to latex? no    Vaccine information given and explained to patient? yes

## 2010-02-04 NOTE — Progress Notes (Signed)
Summary: medications arrived   Phone Note Outgoing Call   Call placed by: Starleen Arms CMA,  October 23, 2009 4:05 PM Call placed to: Patient Summary of Call: medications arrived, patient aware. Initial call taken by: Starleen Arms CMA,  October 23, 2009 4:05 PM     Appended Document: medications arrived  pt. picked up ADAP meds

## 2010-02-04 NOTE — Miscellaneous (Signed)
  Clinical Lists Changes  Observations: Added new observation of YEARAIDSPOS: 2003  (12/16/2009 11:06) Added new observation of HIV STATUS: CDC-defined AIDS  (12/16/2009 11:06)

## 2010-02-04 NOTE — Progress Notes (Signed)
Summary: adap/spap meds arrived for sept-pt aware  Phone Note Refill Request      Prescriptions: KALETRA 200-50 MG TABS (LOPINAVIR-RITONAVIR) two pills twice a day  #120 x 0   Entered by:   Paulo Fruit  BS,CPht II,MPH   Authorized by:   Yisroel Ramming MD   Signed by:   Paulo Fruit  BS,CPht II,MPH on 09/28/2009   Method used:   Samples Given   RxID:   [id number] TRUVADA 200-300 MG TABS (EMTRICITABINE-TENOFOVIR) one pill a day  #30 x 0   Entered by:   Paulo Fruit  BS,CPht II,MPH   Authorized by:   Yisroel Ramming MD   Signed by:   Paulo Fruit  BS,CPht II,MPH on 09/28/2009   Method used:   Samples Given   RxID:   [id number]  Patient Assist Medication Verification: Medication name: Ammie Ferrier # [id number] Tech approval:mld  Patient Assist Medication Verification: Medication name:kaletra 200/50mg  RX # [id number] Tech approval:mld Call placed to patient with message that assistance medications are ready for pick-up. Paulo Fruit  BS,CPht II,MPH  September 28, 2009 11:15 AM

## 2010-02-04 NOTE — Progress Notes (Signed)
Summary: NCADAP/pt assist meds arrived for Jul  Phone Note Refill Request      Prescriptions: KALETRA 200-50 MG TABS (LOPINAVIR-RITONAVIR) two pills twice a day  #120 x 0   Entered by:   Paulo Fruit  BS,CPht II,MPH   Authorized by:   Yisroel Ramming MD   Signed by:   Paulo Fruit  BS,CPht II,MPH on 07/07/2009   Method used:   Samples Given   RxID:   [id number] TRUVADA 200-300 MG TABS (EMTRICITABINE-TENOFOVIR) one pill a day  #30 x 0   Entered by:   Paulo Fruit  BS,CPht II,MPH   Authorized by:   Yisroel Ramming MD   Signed by:   Paulo Fruit  BS,CPht II,MPH on 07/07/2009   Method used:   Samples Given   RxID:   [id number]  Patient Assist Medication Verification: Medication name: Kaletra 200/50mg  RX # [id number] Tech approval:MLD  Patient Assist Medication Verification: Medication name:Truvada RX # [id number] Tech approval:MLD Tried to contact patient. Was unable to get through at the time call was placed. Paulo Fruit  BS,CPht II,MPH  July 07, 2009 2:26 PM

## 2010-02-18 ENCOUNTER — Encounter (INDEPENDENT_AMBULATORY_CARE_PROVIDER_SITE_OTHER): Payer: Self-pay | Admitting: *Deleted

## 2010-02-20 ENCOUNTER — Encounter (INDEPENDENT_AMBULATORY_CARE_PROVIDER_SITE_OTHER): Payer: Self-pay | Admitting: *Deleted

## 2010-02-24 NOTE — Miscellaneous (Signed)
Summary: ADAP Financial update  Clinical Lists Changes  Observations: Added new observation of FINASSESSDT: 03/12/2009 (02/20/2010 8:58)

## 2010-02-24 NOTE — Miscellaneous (Signed)
  Clinical Lists Changes  Observations: Added new observation of INCOMESOURCE: SSI  WAGES (02/18/2010 16:10) Added new observation of HOUSEINCOME: 16109  (02/18/2010 16:10)

## 2010-04-09 LAB — T-HELPER CELL (CD4) - (RCID CLINIC ONLY): CD4 % Helper T Cell: 32 % — ABNORMAL LOW (ref 33–55)

## 2010-04-13 LAB — T-HELPER CELL (CD4) - (RCID CLINIC ONLY)
CD4 % Helper T Cell: 31 % — ABNORMAL LOW (ref 33–55)
CD4 T Cell Abs: 940 uL (ref 400–2700)

## 2010-05-18 NOTE — Discharge Summary (Signed)
NAME:  Max Ray, Max Ray NO.:  [id number]   MEDICAL RECORD NO.:  [id number]          PATIENT TYPE:  INP   LOCATION:  2023                         FACILITY:  [facility]   PHYSICIAN:  Beatrix Fetters, M.D.     DATE OF BIRTH:  1960-10-02   DATE OF ADMISSION:  05/25/2006  DATE OF DISCHARGE:  05/26/2006                               DISCHARGE SUMMARY   CONTINUITY PHYSICIAN:  Dr. Antony Contras in the [facility].   DISCHARGE DIAGNOSES:  1. Viral upper respiratory infection.  2. Allergic rhinitis.  3. Human immunodeficiency virus with last CD4 count 830.  4. History of gonorrhea.  5. History of genital herpes.   DISCHARGE MEDICATIONS:  1. Include Nasonex two sprays each nostril daily.  2. Allegra 180 mg p.o. daily.  3. Benadryl over-the-counter p.r.n.  4. Truvada 200/300 mg daily.  5. Kaletra 200/50 mg two pills b.i.d.   DISPOSITION AND FOLLOWUP:  The patient is to follow up with Dr. Randon Goldsmith at  the [facility] on June 12 at 3:15 p.m.  At that time  he needs to evaluate the patient's management of his allergy symptoms.   ADMISSION HISTORY AND PHYSICAL:  The patient is a 50 year old with a  past medical history significant for HIV and recent admission in  November 2007 for meningococcemia presenting to the [facility] complaining of sinus congestion secondary to bad  allergies and pollen over the past few weeks.  Denies any headache.  Over the past two days prior to admission, the patient had been  complaining of body aches and pains and generalized symptoms including  sweating, questionable fever and chills, but no photophobia, although he  had had some blurry vision which he attributed to being secondary to  studying for his exams.  He does complain of some shortness of breath,  but no cough, and no chest pain.  No nausea, vomiting, or diarrhea.  He  does have some dizziness when he turns his head from side to side.   PHYSICAL EXAMINATION:  Vital signs:  Temperature 100.1, blood pressure  121/73, pulse 97, respirations 18, O2 sat 97% on room air.  HEENT:  EOMI.  No photophobia.  NECK:  Supple neck.  Chin to chest okay.  LUNGS:  Clear to auscultation bilaterally with good breath sounds.  HEART:  Regular rate and rhythm.  No murmurs, rubs, or gallops.  ABDOMEN:  Soft, nontender, nondistended with positive bowel sounds.  EXTREMITIES:  Reveals no cyanosis, clubbing, or edema with 2+ peripheral  pulses.  SKIN:  Warm and dry without rash.  NEURO:  Kernig and Brudzinski signs were negative.  Cranial nerves II-  XII were intact.  Motor was 5/5 bilaterally.   LABORATORY DATA:  Sodium was 138, potassium 4.8, chloride 100, bicarb  31, BUN 11, creatinine 1.4 with a glucose of 108.  CMET was normal.  GC  and chlamydia were negative.  White count 4.3, hemoglobin 16, platelets  118.  CT of the head showed a tiny hyperdensity in the right temporal  lobe.  May represent  vessels deep within white matter.  CT of the  sinuses showed perinasal sinus inflammation without periorbital abscess.  Chest x-ray showed bibasilar atelectasis with a questionable right  nipple shadow.  UA was negative.   HOSPITAL COURSE:  1. Viral upper respiratory infection.  The patient did present with      general aches and pains and a low-grade fever with congestion and      fatigue.  We did consider either pneumonia or bronchitis, but chest      x-ray was negative.  Of course, there was a worry with this patient      presenting with meningitis as he had similar presentation back in      November; however, he did not have any signs of hemodynamic      compromise and all CNS exams were negative.  This could have been      due to sinusitis; however, there was no sinusitis confirmed by CT.      We did check blood cultures and urine cultures and sputum culture;      however, the patient was not able to produce any sputum to culture.      We also  checked urine for Legionella and Strep pneumo which were      negative.  GC and chlamydia were also negative.  We sent the      patient home with instructions in how to better manage his symptoms      such as using decongestants and Tylenol.  2. Allergic rhinitis.  This patient could definitely have his      allergies better managed.  He was instructed to continue using      Allegra during the day, Benadryl at night as needed, and Nasonex      two sprays each nostril daily and to follow up with his primary      care physician in a few weeks regarding his response to this      regimen.  3. Human immunodeficiency virus.  The patient was to continue his      human immunodeficiency virus medications as instructed.  4. History of gonorrhea, chlamydia, and genital herpes.  The patient      did report having unprotected sex with a man weeks prior to      admission.  We thus checked a GC and chlamydia which was negative.   DISCHARGE VITALS:  Temperature 98.4, pulse 94, respirations 20, blood  pressure 111/69, satting 95% on room air.   DISCHARGE LABORATORY:  Hemoglobin 14.9, white count 5.4, platelets 89.  Sodium 136, potassium 3.8, chloride 103, bicarb 27, glucose 125, BUN 8,  creatinine 1.6.      Beatrix Fetters, M.D.  Electronically Signed     CA/MEDQ  D:  05/26/2006  T:  05/26/2006  Job:  [id number]
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
## 2010-05-18 NOTE — Discharge Summary (Signed)
NAME:  Max Ray, Max Ray NO.:  [id number]   MEDICAL RECORD NO.:  [id number]          PATIENT TYPE:  INP   LOCATION:  2023                         FACILITY:  [facility]   PHYSICIAN:  Beatrix Fetters, M.D.     DATE OF BIRTH:  10-Jun-1960   DATE OF ADMISSION:  05/25/2006  DATE OF DISCHARGE:  05/27/2006                               DISCHARGE SUMMARY   ADDENDUM:  The patient was kept for one additional day during his  hospital stay after his platelets were noticed to be low on the day  after admission, when the platelet level was 89 after his admission  level was 118.  We subsequently repeated his platelets on the day after  admission and they were 101 and then the day of discharge his platelets  were 99.  We concluded this was secondary to progression of his HIV  illness and the patient will be following up with his primary care  physician on June 12 at 3:15.  At that time, his primary care physician  will followup his platelet levels.      Beatrix Fetters, M.D.  Electronically Signed     CA/MEDQ  D:  05/29/2006  T:  05/29/2006  Job:  [id number]

## 2010-05-19 ENCOUNTER — Telehealth: Payer: Self-pay | Admitting: *Deleted

## 2010-05-19 NOTE — Telephone Encounter (Signed)
Patient c/o itchy rash on palms.  Advised to try Benadryl and patient given appt. On 05/21/10 Wendall Mola CMA

## 2010-05-21 ENCOUNTER — Ambulatory Visit (INDEPENDENT_AMBULATORY_CARE_PROVIDER_SITE_OTHER): Payer: Medicare Other | Admitting: Adult Health

## 2010-05-21 DIAGNOSIS — B20 Human immunodeficiency virus [HIV] disease: Secondary | ICD-10-CM

## 2010-05-21 DIAGNOSIS — Z79899 Other long term (current) drug therapy: Secondary | ICD-10-CM

## 2010-05-21 DIAGNOSIS — R21 Rash and other nonspecific skin eruption: Secondary | ICD-10-CM

## 2010-05-21 DIAGNOSIS — Z113 Encounter for screening for infections with a predominantly sexual mode of transmission: Secondary | ICD-10-CM

## 2010-05-21 LAB — T-HELPER CELL (CD4) - (RCID CLINIC ONLY)
CD4 % Helper T Cell: 33 % (ref 33–55)
CD4 T Cell Abs: 810 uL (ref 400–2700)

## 2010-05-21 NOTE — Discharge Summary (Signed)
NAME:  Max Ray, Max Ray NO.:  [id number]   MEDICAL RECORD NO.:  [id number]                   PATIENT TYPE:  INP   LOCATION:  5509                                 FACILITY:  [facility]   PHYSICIAN:  Hillery Aldo, M.D.                DATE OF BIRTH:  27-Mar-1960   DATE OF ADMISSION:  10/05/2001  DATE OF DISCHARGE:  10/08/2001                                 DISCHARGE SUMMARY   DISCHARGE DIAGNOSES:  1. Shortness of breath with dyspnea on exertion, presumed to be Pneumocystis     pneumonia.  2. Human immunodeficiency virus positive with CD4 count less than 20.  3. Fever.  4. History of significant weight loss.  5. Anemia.  6. Chest pain.  7. Posttraumatic stress disorder with history of depression.  8. Significant psychosocial stressors.  9. History of hepatitis B.  10.      Abnormal MRI of the brain.  11.      Dizziness.   DISCHARGE MEDICATIONS:  1. Zithromax 600 mg 2 pills q weekly.  2. Protonix 40 mg 1 p.o. q.d.  3. Septra DS 2 pills p.o. q.6h.  4. Multivitamin 1 p.o. q.d.  5. Trazodone 50 mg 1 p.o. q.h.s.  6. Valium 2 mg 1 p.o. b.i.d. p.r.n.   DISPOSITION ON DISCHARGE:  Activities as tolerated with the exception of no  driving while taking Valium or if feeling dizzy or otherwise unable to  drive.   FOLLOW UP VISITS:  1. DrFannie Knee Drinkard on October 09, 2001, at 11:30 a.m. as well as on     November 08, 2001.  2. Dr. Lovell Sheehan, neurosurgery, December 18, 2001, at 4:10 p.m., as previously     scheduled.  3. The patient understands that he should call Dr. Flo Shanks, at [phone] to schedule an appointment.   PROCEDURES PERFORMED:  1. Chest x-ray showed a subtle, bilateral, fluffy airspace infiltrate     consistent with PCP.  2. Brain MRI has showed no acute abnormality with slight progression in mild     cerebral cortical atrophic changes bifrontally as well as bilateral     mastoid sinus disease and a stable-appearing, fluid-filled  lesion in the     lower right cerebellar pontine angle cistern and in the sella/parasellar     region.   ADMISSION HISTORY AND PHYSICAL:  This is a 50 year old African-American male  with HIV diagnosed in 1994 but without seeking health care for the last  three years and a recent CD4 count from September of this year that was 20  with a viral load of 522,000, that has had recent dyspnea on exertion  lasting the last month as well as a cough that has lasted the last several  months, who has noticed increasing shortness of breath as well as a fever  the past several days.  Yesterday, he thought that  he was having a heart  attack with near syncope and severe stabbing chest pain that lasted several  minutes.  In addition, he has had significant difficulties lately secondary  to recently losing his job.  The patient was admitted with presumed PCP  pneumonia given his low CD4 count, high viral load, and shortness of breath  on exertion with desaturation on pulse oxygen upon exertion.   ALLERGIES:  The patient has no known drug allergies.   SOCIAL HISTORY:  The patient is divorced with three children.  He recently  lost his job as well as his place of residence and for the last week has  been living with his mom.  He is currently applying for disability as well  as seeking financial assistance for HIV medications.  He denies ever using  tobacco.  He endorses occasional alcohol use, sometimes heavily at times as  well as denying other drug use with the exception of marijuana, which he  states that he last used three years ago.  The patient attended college for  several years as his highest educational level.   PHYSICAL EXAMINATION:  VITAL SIGNS:  Pulse 123, blood pressure 109/60,  temperature 102.8, respiratory 15, O2 saturations 97% on room air at rest.  GENERAL:  Sick-appearing.  EYES:  Pupils equal round and reactive to light.  Extraocular movements  intact.  ENT:  Without erythema,  moist.  Tympanic membranes clear.  Oropharyngeal  without evidence of thrush.  NECK:  Without lymphadenopathy.  RESPIRATORY:  Decreased breath sounds with coughing upon deep inspiration.  CARDIOVASCULAR:  Regular rate and rhythm without murmurs, rubs, or gallops.  GI:  Soft, nontender, nondistended, with no HSM.  EXTREMITIES:  Without edema, 2+ radial pulses and sensation intact x 4.  SKIN:  Without rashes.  NEUROLOGIC:  Cranial nerves 2-12 intact.  Cerebellar function intact.   ADMISSION LABORATORY DATA:  Chem-7:  Sodium 133, potassium 4.1, chloride  102, bicarb 24, BUN 10, creatinine 1.4, glucose 108, calcium 8.4, bilirubin  0.6, alkaline phosphatase 35, SGOT 41, SGPT 21, protein 7.8, albumin 3.0.  CBC:  White blood cells 5.0, hemoglobin 10.1, hematocrit 30.3, platelets  161, ANC 3.6, MCV 88.6, RDW 12, CK 120, CK-MB less than 0.3, troponin I  0.02, LDH equal to 269.  Arterial blood gas on room air:  pH 7.428, pCO2  37.7, pO2 89, bicarb 24,   HOSPITAL COURSE:  1. SHORTNESS OF BREATH/PNEUMONIA/PRESUMED PNEUMOCYSTIS PNEUMONIA:  Sputum     cultures as well as smears and AFB stains were obtained to attempt to     isolate an organism responsible for the patient's pneumonia.  In     addition, blood cultures x 2 as well as fungal blood cultures were     obtained to attempt to identify any infection.  It was felt to be     unlikely that this chest pain and shortness of breath was secondary to a     cardiac origin in a 50 year old male, and with one set of negative     enzymes, no further cardiac enzymes were obtained.  The patient rapidly     improved on Septra double-strength, becoming afebrile with an improvement     in his breathing.  By the time of discharge, the patient was able to     converse easily without developing any shortness of breath.  2. HUMAN IMMUNODEFICIENCY VIRUS:  HAART therapy was not initiated in the    hospital secondary to the patient lacking funding to  continue the  therapy     upon discharge.  The patient understands and will be applying for funding     to arrange for these medications.  3. FEVER:  The patient's fever resolved on antibiotics.  4. WEIGHT LOSS:  The patient's appetite improved with treatment of his     pneumonia; however, this will have to be followed.  It is likely a result     of his underlying HIV infection and the wasting process associated with     that infection.  5. ANEMIA WITH A STABLE HEMOGLOBIN OF 10.1:  This was felt to be likely due     to anemia of chronic disease, and the patient was shown to not be     deficient in iron with a ferritin level of 1025.  6. CHEST PAIN:  The patient had one minor episode of chest pain while in the     hospital that went away quickly.  7. POSTTRAUMATIC STRESS DISORDER WITH A HISTORY OF DEPRESSION:  The patient     is currently getting outpatient psychiatric care, and he knows the     importance of follow-up with those providers.  8. HISTORY OF HEPATITIS B.  9. HISTORY OF ABNORMAL MRI:  This was followed up by an MRI with results as     described in the procedures performed section.  10.      DIZZINESS:  The patient's dizziness somewhat improved with     treatment of his underlying pneumonia, and the patient was encouraged to     continue to receive health care from Dr. Lazarus Salines, whom he has been seeing     for this problem in the past.   LABORATORY DATA UPON DISCHARGE:  CBC:  White blood cells 5.4, hemoglobin  8.9, hematocrit 26.4, platelets 194 with negative blood cultures to date and  negative PCP smear and negative AFB stain sputum.  Acute serology for  hepatitis B and C were negative.  The patient's basal metabolic panel on  discharge was sodium 140, potassium 4.0, chloride 108, CO2 26, BUN 4,  creatinine 1.1, glucose 95, and calcium 8.4.      Andres Ege, M.D.    MS/MEDQ  D:  10/11/2001  T:  10/15/2001  Job:  [id number]   cc:   Cristi Loron, M.D.   Gloris Manchester. Lazarus Salines, M.D.   Willis Modena, M.D.  (fax [phone])

## 2010-05-21 NOTE — Discharge Summary (Signed)
NAME:  Max Ray, WEIDINGER NO.:  [id number]   MEDICAL RECORD NO.:  [id number]          PATIENT TYPE:  INP   LOCATION:  5503                         FACILITY:  [facility]   PHYSICIAN:  Manning Charity, MD     DATE OF BIRTH:  [birth year]   DATE OF ADMISSION:  11/08/2005  DATE OF DISCHARGE:  11/19/2005                                 DISCHARGE SUMMARY   DISCHARGE DIAGNOSES:  1. Meningococcemia.  2. Systemic inflammatory response syndrome/sepsis secondary to      meningococcemia.  3. Clostridium difficile colitis.  4. Acute renal failure secondary to SIRS/sepsis.  5. Human immunodeficiency virus with a CD4 count greater than 800 in      October 2007.  6. Depression followed by Dr. Donell Beers.  7. History of cryptococcal-positive stool in May 2004.  8. 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  9. 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  10.G6PD deficiency diagnosed in October 2003.   DISCHARGE MEDICATIONS:  1. Kaletra 2 tablets p.o. b.i.d.  2. Truvada one tablet p.o. daily.  3. Flagyl 250 mg 1 tablet p.o. t.i.d. x 8 days.  4. OxyContin 20 mg p.o. b.i.d.  5. Percocet 5/325 one tablet p.o. q.6h. p.r.n. pain.   Of note, medications number 4 and 5 were provided in a three-week supply.   DISPOSITION AND FOLLOWUP:  [name] is being discharged from [facility] in stable condition.  He is to follow up with Dr. Roxan Hockey, as  scheduled prior to his admission, in the Infectious Disease clinic for his  regular HIV care.  Otherwise, he is stable and will not require an  outpatient followup as his acute issues have resolved at the time of this  discharge.   PROCEDURES PERFORMED:  The patient had a PICC line inserted on the left  brachial vein on November 09, 2005.  He tolerated this well without any  complications.   CONSULTATIONS:  Dr. Lenn Sink, Infectious Disease.   BRIEF ADMISSION HISTORY AND PHYSICAL:  [name] is a 50 year old African-  American man with a past medical history significant for HIV who presented  to the [facility] Emergency Department on November 08, 2005 with a complaint  of sore throat as well as diagnosis of urinary tract infection.  At that  time he was given the diagnosis of pharyngitis with an outpatient  prescription for ciprofloxacin.  He initiated treatment with oral  antibiotics.  However, his symptoms worsened and so he returned to the ED  the following day with complaints of blurred vision and difficulty  breathing.  Just prior to arrival he had several episodes of vomiting as  well as diffuse weakness and myalgias.  He reports that he took Tylenol  without any relief.  Also of note, the patient developed acutely chills and  shortness of breath after he left the ED the first time, and he also  experienced some chest pain that was relieved with morphine.  He denies any  cough.  He did have pain with swallowing with some sore throat.  He had  diarrhea on  the morning of admission as well as on the evening of admission.  For further details, please see the chart.   On admission, physical examination revealed a temperature of 98.7, a blood  pressure that was 71/46, a pulse that ranged from 128 to 145, respiratory  rate of 18 to 20, O2 saturations were 95% on 2 liters.  In general, he is a  diaphoretic male in apparent distress.  EYES:  Pupils are constricted but  equal.  Examination of his oropharynx was erythematous but no exudate.  He  had no appreciable lymphadenopathy.  LUNGS:  Clear to auscultation bilaterally.  CARDIOVASCULAR:  Tachycardic but regular rhythm.  ABDOMEN:  Soft, nontender, nondistended.  He had normal bowel sounds.  EXTREMITIES:  Cool, no edema was present.  He had no visible rash on  inspection of his skin.  NEUROLOGIC:  Cranial Nerves II-XII are grossly intact.  Strength bilaterally  in her upper extremities are 5/5.  He is alert and oriented x3.   Admission laboratories were  notable for the following:  White blood cell  count 30.3, hemoglobin 14.7, platelets 136.  He had an ANC of 28.5 and MCV  of 101.8, sodium 132, potassium 4.2, chloride 101, bicarbonate 23, BUN 27,  creatinine 3.0, chloride 108, bilirubin 0.7, alk phos 52, AST 37, ALT 25,  protein 6.6, albumin 2.9, calcium 7.9.  Again, a CD4 count of 830.  A chest  x-ray revealed no pneumonia.  He had peribronchial thickening.  A rapid  Strep test was negative.  A urinalysis revealed 11 to 20 white blood cells  and rare bacteria.  Blood cultures x2 were obtained in the ED pending at the  time of his admission to the hospital.   HOSPITAL COURSE:  1. SIRS/sepsis.  The patient was evaluated in the ED multiple times by the      admission team who stayed with him to stabilize him during the acute      episode of hypotension and tachycardia.  Given his elevated white blood      cell count, the primary team was pretty confident his condition was      secondary to sepsis.  The question however was what was the organism.      While we waited for those results, the patient received multiple IV      fluid boluses with normal saline to which he responded somewhat.  He      was transferred to the ICU, and IV access was obtained through a PICC      line.  Goal-directed therapy for sepsis was initiated with goals to      maintain a CVP between 8 and 12 with a MAP of greater than 16 and      hematocrit greater than 30.  There was a discussion of initiating      Xigris therapy; however, this was held after further consideration by      Dr. Roxan Hockey and the critical care team.  The patient was initiated on      vancomycin as well as Zosyn IV.  Following admission to the ICU and      aggressive fluid resuscitation, the patient's blood pressure eventually      normalized and he stabilized.  After identification of the etiology of     his bacteremia, please see number 2 below, the patient was treated with      appropriate  antibiotics and remained stable throughout the rest of his  hospitalization.   1. Meningococcemia.  The patient was clearly septic from a bacterial      source.  Blood cultures returned gram-negative cocci, and these were      further speciated as Neisseria meningitides.  The patient received      treatment for a total of seven days of IV Rocephin.  Infectious disease      graciously consulted and felt that this would be adequate therapy for      him; he had received a dose of Primaxin during the acute stabilization      as well.  Given infectious concern for this oganism, hospital      infectious control retraced who had been in close contact with the      patient and created a list of all practitioners including nurse,      nurses' aide, MDs, etc.  Therapy with ciprofloxacin was offered to all      those who wished to obtain it.  However, on further review and      discussion with I.D., only those who were directly exposed to      oropharyngeal contents; for example, with intubation, or in direct      contact with oropharyngeal contents really needed prophylactic      treatment.  Regardless, this was addressed with the hospital, and all      those wishing treatment received it.  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  Contacts there were treated prophylactically      as well from my understanding.   His leukocytosis eventually resolved with treatment with antibiotics, and  clinically he is much improved at the time of discharge.  He has multiple  sets of blood cultures that are negative to date following treatment with  antibiotics as described above.  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  He has been given a letter stating this.  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  I reassured him that he is not in any way infectious at this  point, and he is free to return to class as normal.   1. Bilateral leg pain.  Of note, the patient had multiple petechiae form      on bilateral lower extremities approximately 72 hours after his      hospital admission.  These were quite painful to him, and they have      really limited his progress in the hospital in terms of regaining      strength and conditioning.  He applies ice packs for the majority of      the day and was treated symptomatically with opiate pain medications.      We tried Neurontin; however, this really failed in terms of therapy.     He is being discharged with three-week supply of each.  He has also      been given wound instructions to care for some of the open areas of  micro infarctions on the bilateral lower extremities.   1. Clostridium difficile-associated diarrhea.  Following his course of      antibiotics the patient developed a prolonged course of diarrhea which      stool samples were obtained from and sent for studies.  This came back      with Clostridium difficile toxin positive.  For this, he was treated      for one day with vancomycin and transition to oral metronidazole given      financial concerns.  He will be treated for a total of 12 days with      eight days as outpatient therapy.  At the time of discharge, his      diarrhea is fully resolved, and the patient is comfortably eating and      stooling normally.   1. Acute renal failure.  Likely, this was secondary to SIRS/sepsis.  His      creatinine peaked, I believe, at 2.4 and continued to trend down      throughout his hospitalization with fluid  resuscitation as well as      stabilization and resolution of number 1.  Just prior to his discharge,      his creatinine stabilized 0.9 to 1.0.   1. HIV.  The patient is followed in the clinic by Dr. Roxan Hockey who is also      the consultant during this time.  We restarted his antiretroviral      therapy while he was in house, and he has been instructed to continue      taking this as before.  Again, a CD4 count of greater than 800, and the      patient was instructed to follow up with Dr. Roxan Hockey as before for      regular HIV care.  Of concern however, the patient had multiple      requests during this admission, to be tested for a GC/Chlamydia which      he was and was negative via the urinary probe.  This is concerning      given his 042 status that he is having unprotected exposures with      partners.  Dr. Roxan Hockey, I believe, spoke to him about this.   1. Elevated troponins in the setting of SIRS/sepsis.  The patient had sets      of cardiac enzymes in the face of acute hypotension.  These revealed      elevated troponins that peaked at 2.25; however, they continued to      trend down.  We quit obtaining markers after they continued trending      down for several sets in a row.  I do not believe this warrants any      further cardiac workup given the patient has no significant history of      coronary artery disease.  I think this is all in the setting of      SIRS/sepsis and acute renal failure.   1. Hypokalemia.  The patient had several episodes where he was hypokalemia      while in house.  This most likely is secondary to prolonged diarrhea      from the Clostridium colitis-associated diarrhea.  At the time of this      discharge summary, a BMET is pending.  However, if he is low, certainly      I will supplement him with oral potassium prior to his discharge, and  this can be followed as an outpatient.  On the day of discharge, the patient's temperature was 98.8, blood  pressure  142/97, pulse 94, respiratory rate 20, O2 saturation is 95% on room air.  Labs revealed a white blood cell count 15.4, hemoglobin 9.8, platelets  402,000.  Fecal occult blood is negative.  Giardia and cryptococcal stool  studies are negative.  Again, blood cultures x2 are no growth to date.      Antony Contras, M.D.  Electronically Signed     ______________________________  Manning Charity, MD    GL/MEDQ  D:  11/18/2005  T:  11/19/2005  Job:  [id number]

## 2010-05-21 NOTE — Op Note (Signed)
NAME:  Max Ray, Max Ray                          ACCOUNT NO.:  [id number]   MEDICAL RECORD NO.:  [id number]                   PATIENT TYPE:  AMB   LOCATION:  DSC                                  FACILITY:  [facility]   PHYSICIAN:  Adolph Pollack, M.D.            DATE OF BIRTH:  25-Jul-1960   DATE OF PROCEDURE:  05/21/2002  DATE OF DISCHARGE:                                 OPERATIVE REPORT   PREOPERATIVE DIAGNOSIS:  Anal condylomata.   POSTOPERATIVE DIAGNOSIS:  Anal condylomata.   OPERATION PERFORMED:  1. Examination under anesthesia and anoscopy.  2. Excision and fulguration of anal condylomata.   SURGEON:  Adolph Pollack, M.D.   ANESTHESIA:  General.   INDICATIONS FOR PROCEDURE:  The patient is a 50 year old male with HIV and  AIDS who has had perianal itching, pain and intermittent bleeding.  He has a  number of anal condylomata and now presents for excision, fulguration as  well as examination.   DESCRIPTION OF PROCEDURE:  The patient was seen in the holding area, brought  to the operating room and placed supine on the operating table and a general  anesthetic was administered.  He was then placed in lithotomy position.  The  perianal area was sterilely prepped and draped.  Using 0.5% Marcaine with  epinephrine, I performed a perianal block.  I began at the 6 o'clock  position where he had two anal condylomata and using the cautery excised  these.  He then had a smaller one in the 5 o'clock position, one in the 3  o'clock position and one at the 12 o'clock and one in the 9 o'clock.  All of  these were sizable enough, however, to be sent separately to pathology.  Once I had excised all of these, I then fulgurated the smaller anal  condylomata going all the way up into the anal canal with electrocautery.   Anoscopy was performed in four quadrants to examine the area and no further  condylomata were noted.  No anal masses or distal rectal masses were noted.  Hemostasis  was adequate.  I did take some Gelfoam and put it intra-anally  against the raw surface and covered it with a dry dressing.   The patient tolerated the procedure well without any apparent complications.  He subsequently was taken to the recovery room in satisfactory condition.  He will be given discharge instructions and follow up with me in about two  weeks.  He was given a prescription for Tylox for pain.                                                Adolph Pollack, M.D.    Kari Baars  D:  05/21/2002  T:  05/21/2002  Job:  [id number]

## 2010-05-21 NOTE — Discharge Summary (Signed)
NAME:  Max Ray, Max Ray                          ACCOUNT NO.:  [id number]   MEDICAL RECORD NO.:  [id number]                   PATIENT TYPE:  INP   LOCATION:  5010                                 FACILITY:  [facility]   PHYSICIAN:  Tonita Cong, M.D.               DATE OF BIRTH:  [birth year]   DATE OF ADMISSION:  05/28/2002  DATE OF DISCHARGE:  05/31/2002                                 DISCHARGE SUMMARY   DISCHARGE DIAGNOSES:  1. Hematochezia secondary to internal hemorrhoids, now resolved.  2. Human immunodeficiency virus diagnosed in 1994.  He has been on HAART for     six months.  3. Cryptosporidium-positive stool to be treated with a two week course of     paromomycin.  4. Depression.   DISCHARGE MEDICATIONS:  1. Anusol suppository one per rectum each night.  2. Diar-aid 300 mcg p.o. at bedtime.  3. Kaletra six tablets p.o. at bedtime.  4. Epivir 300 mcg p.o. at bedtime.  5. Effexor 75 mcg p.o. daily.  6. Desipramine 25 mcg p.o. q.a.m.   PROCEDURES:  1. Flexible sigmoidoscopy on May 29, 2002.  2. Esophagogastroduodenoscopy on May 30, 2002.  3. Colonoscopy on May 30, 2002.   HISTORY OF PRESENT ILLNESS:  The patient is status post excision of anal  condylomata on May 21, 2002.  Two days prior to admission, he noticed that  he had some crampy abdominal pain and his usual 2-3 light brown, diarrhea-  like stools.  Two hours before admission to the ER on May 28, 2002, the  patient began passing bright red blood per rectum after working out at [employer].  The patient also noticed worsening of his cramping.  He estimated  approximately 500 mL of dark red blood in the toilet.  Nothing like this had  ever happened to him before.  There were no aggravating or alleviating  factors to the bleeding.  The defecation did somewhat ease his cramping  pain.  The patient denies any ulcers, nausea, and vomiting.  No prior bowel  disease noticed on colonoscopy nor EGD.  No excessive NSAID  use.   PHYSICAL EXAMINATION:  VITAL SIGNS:  Pulse 121, blood pressure 102/70,  temperature 99.0 degrees, O2 saturations 99% on room air.  GASTROINTESTINAL:  Significant for some tympany to percussion.  Tender to  palpation diffusely.  Negative Murphy's sign.  Bright merlot-colored blood  passing per rectum.   ADMISSION LABORATORY DATA:  Hemoglobin 13.9, WBC 11.1, platelets 310,  hematocrit 41.3.  PTT 34, PT 13.3, INR 1.0.   HOSPITAL COURSE:  Problem #1.  Hematochezia:  The patient had two large bore  IVs started and was volume resuscitated.  His hemoglobin was tracked q.6 h.  It reached a nadir of 7.9 on the day of admission and then stabilized.  His  bleeding ceased.  On discharge, his hemoglobin is 9.2.  The patient did  not  require transfusion during this admission.  He underwent flexible  sigmoidoscopy on May 29, 2002, which did not reveal a source of bleeding.  He underwent EGD which showed a normal upper GI tract.  The patient  underwent colonoscopy that showed internal hemorrhoids.  These were felt to  be the source of bleeding.  The patient was treated with Anusol  suppositories.  His bowel movement on the day of discharge was brown, heme  positive, and regular.  He was discharged home in stable condition and told  to follow up if the bleeding returns.   Problem #2.  HIV:  The patient is currently enrolled in an HIV study at [facility].  He takes the medications as listed above.  These medications  were continued throughout his hospitalization.   Problem #3.  Cryptosporidium:  Stool culture for Giardia and Cryptosporidium  on May 28, 2002, came back positive for Cryptosporidium SCN.  The usual  treatment for Cryptosporidium is supportive and treatment of the patient's  immunocompromised state curb siding ID.  It was decided that the patient  should be treated with paromomycin 500 mg q.i.d. x2 weeks, which sometimes  helps patients with Cryptosporidium.  This may be the  cause of the patient's  crampy abdominal pain.  However, it is felt that his diarrhea would be worse  if this was an acute infection of Cryptosporidium that has caused 2-3 loose  bowel movements per day as a result of HIV medications.  Per the patient,  his last CD4 was 212.   Problem #4.  Depression:  The patient was continued on desipramine and  Effexor.   DISPOSITION:  The patient is to follow up with [name] at [facility]  next week and is to call Dr. Maris Berger office for an appointment in 2-3  weeks for followup of his penial condylomata removal.     Kipp Brood A. Viviann Spare, M.D.                   Tonita Cong, M.D.    BAT/MEDQ  D:  05/31/2002  T:  05/31/2002  Job:  [id number]   cc:   Luciano Cutter Drinkard  [facility]   Adolph Pollack, M.D.  [address]  [place]  [place] [address]  Fax: [phone]   Barbette Hair. Arlyce Dice, M.D. [facility]

## 2010-05-21 NOTE — Discharge Summary (Signed)
NAME:  Max Ray, Max Ray NO.:  [id number]   MEDICAL RECORD NO.:  [id number]                   PATIENT TYPE:  INP   LOCATION:  2857                                 FACILITY:  [facility]   PHYSICIAN:  Alvester Morin, M.D.               DATE OF BIRTH:  03-11-1960   DATE OF ADMISSION:  10/12/2001  DATE OF DISCHARGE:  10/19/2001                                 DISCHARGE SUMMARY   DISCHARGE DIAGNOSES:  1. Human immunodeficiency virus/acquired immunodeficiency syndrome.  2. Failure to thrive.  3. History of Pneumocystis carinii pneumonia.  4. Glucose-6-phosphate dehydrogenase deficiency with hemolysis.  5. Hepatitis.  6. Human immunodeficiency virus nephropathy.  7. Hyponatremia.  8. Pancreatitis.  9. Oral Candidiasis.  10.      Depression.  11.      Pituitary cysts.  12.      Gastroesophageal reflux disease.   DISCHARGE MEDICATIONS:  1. Stop taking Septra that was given to him on the last admission.  2. Protonix 40 mg one tablet p.o. one hour before largest meal of the day     q.d.  3. Zithromax 600 mg two tablets p.o. every Friday.  4. Multivitamin one tablet p.o. q.d.  5. Valium 2 mg one tablet p.o. b.i.d. for anxiety.  6. Trazodone 50 mg one tablet p.o. q.h.s. as sleep aide only as needed.   DISPOSITION:  The patient was discharged home in stable condition.   FOLLOW UP:  Follow up with Max Ray at [facility] on Tuesday, October 23, 2001, at 11 a.m.  The patient is to see Dr. Donell Beers soon.  The patient  is instructed to call and schedule an appointment today.  The patient is to  be seen in short-stay unit at [facility] on November 17, to receive his  inhaled Pentamidine.  The patient is instructed to call [phone], for an  appointment and time.   PROCEDURE:  Two units of packed red blood cells were transfused on October 18, 2001.  No complications.   CONSULTATIONS:  1. Dr. Jeanie Sewer, psychiatry.  2. Nutrition.  3. Hospice.   HISTORY OF  PRESENT ILLNESS:  The patient is a 50 year old male with  diagnosed AIDS who was recently discharged from [facility] on  October 6, with a diagnosis of presumed PCP who presented complaining of  fatigue, weakness, emesis and fever.  The patient says that his shortness of  breath has improved.  His cough remains unchanged.  It is productive with  clear sputum.  He has daily temperatures and chills as well as myalgias that  are not responding to Tylenol or Advil.  Appetite has been poor and he has  been unable to take any p.o., but has been attempting to take his  medications.  He had experienced emesis four to five times that was clear  without blood prior to presentation.  At the  time of presentation, the  patient had requested had requested a hospice evaluation.   PHYSICAL EXAMINATION:  VITAL SIGNS:  Temperature 97.4, heart rate 96, blood  pressure 108/70, O2 saturations 97% on room air.  GENERAL:  The patient was reclining on the stretcher in no apparent  distress.  HEENT:  Mucous membranes were pale, but moist.  NECK:  Bilateral lymphadenopathy in the anterior cervical chain and left-  sided occipital chain lymphadenopathy.  LUNGS:  Respiratory effort was good.  Lungs were clear to auscultation  bilaterally.  ABDOMEN:  Good bowel sounds, flat, soft, nontender without masses.   LABORATORY DATA AND X-RAY FINDINGS:  Chest x-ray showed improvement from  October 6, at the time of discharge.  His electrolytes were within normal  limits.   HOSPITAL COURSE:  1 - HUMAN IMMUNODEFICIENCY VIRUS/ACQUIRED IMMUNODEFICIENCY  SYNDROME:  The patient reports a last CD4 count of less than 20 and viral  load greater than 500,000.  He has not been started on HAART.  Case  management was involved to work with his assigned case manager, Mr. Mila Palmer,  and it was determined that the patient is currently on the waiting list to  be enrolled again in a medication assistance program.  All paperwork and   assistance is being provided as an outpatient.  He is followed by Max Knee  Ray at [facility] and is followed also with the [facility].   2 - FAILURE TO THRIVE:  At the time of admission, the patient was not  tolerating or desiring any p.o. intake and felt extremely weak.  After he  was IV fluid resuscitated, he responded very well and was eventually taking  an excellent regular p.o. intake prior to discharge.  The initial nausea was  controlled with Phenergan.  The patient remained afebrile, however, all  sources including urine, sputum and blood were ruled out for current  infection.  Therefore, it was determined that the fever was due to an HIV  fever and was symptomatically was provided with Tylenol.  Hospital consult  was performed per the patient's and family's request.  It was determined  that the hospice program was not applicable to the patient at this time as  he is not on treatment and is expected to have good response once HAART is  initiated.  Therefore, case management was able to organize in home health  aide for PT and OT.  This is to begin at the time of discharge.   3 - PNEUMOCYSTIS CARINII PNEUMONIA:  The patient was given the diagnosis of  PCP at the time of last discharge.  He had been started on Septra for  treatment.  We discontinued the Septra on October 14, due to elevating liver  enzymes.  We began clindamycin 600 mg IV q.8h. plus Primaquine 30 mg p.o.  q.d. with expected treatment x7 more days.  However, as the patient had  continuing elevated LFTs and showed evidence of inability to tolerate the  Primaquine, both the Primaquine and the clindamycin was discontinued.  A  decision was made that he has been adequately treated as his chest x-rays  showed marked improvement and the patient was clinically much improved.  A  decision was made to start him on PCP prophylaxis with Pentamidine 300 mg inhaled each month with first dose on October 17, at the time  of discharge.   4 - HUMAN IMMUNODEFICIENCY VIRUS NEPHROPATHY:  He had consecutive urinalysis  showing proteinuria.  A spot urine protein and  creatinine were checked  showing a 6 g proteinuria.  A renal ultrasound showed no hydronephrosis,  normal and symmetric kidney size bilaterally, but bilateral cortical  echogenicity that was consistent with medical disease highly suggestive of  HIV nephropathy.  Dr. Caryn Section, from nephrology, gave a casual consult and  suggested that no biopsy and no further workup be performed at this time in  a patient with untreated AIDS.  He suggested that the patient keep his  nondominant vasculature intact with no IV blood draws in the left arm in  anticipation of AV graft or fistula being formed for dialysis sometime in  the future.   5 - GLUCOSE-6-PHOSPHATE DEHYDROGENASE DEFICIENCY:  The patient was tested  for G6PD deficiency.  His serum G6PD level was 0.04 which was extremely low.  On October 16, it was noted that his hemoglobin had fallen from 9 to 6.9.  Haptoglobin was less than 6.  LDH was 1181.  Total bilirubin was 1.1.  These  findings were strongly consistent with a hemolysis likely due to a reduction  oxidation reaction secondary to the G6PD deficiency.  The patient was given  a dietary consult to instruct him on what foods to avoid and it was placed  on the chart and his primary care Jadden Yim was informed so that appropriate  alterations in medications could be made.  This includes avoiding Septra as  well as Dapsone and Primaquine.  This was important information in deciding  to use inhaled Pentamidine for the patient's PCP prophylaxis.  Due to the  patient's hemolysis, he was transfused two units of packed red blood cells  and hemoglobin responded nicely going from 6.9 to 8.8.   6 - HEPATITIS:  The patient's acute and chronic hepatitis panels were  negative.  We felt that this elevation in liver enzymes was due to  medications including Septra and  Primaquine.  The Septra and Primaquine were  discontinued.  An abdominal CT showed no solid organ disease or no lymph  nodes.   7 - HYPONATREMIA:  The patient was noted to have a suddenly decreasing  sodium level with last 128.  His IV fluids were changed to normal saline.  He was not fluid restricted.  Urine and serum studies were consistent with a  euvolemic hyponatremia and may likely be due to SIADH picture as the patient  has a known pituitary cyst.  The patient was stable and asymptomatic with no  further intervention was provided.   8 - ORAL CANDIDIASIS:  The patient developed white plaques consistent with  candidal infection of the oral mucosa.  He was treated with Diflucan.  The  condition subsequently resolved.   9 - DEPRESSION/POSTTRAUMATIC STRESS DISORDER:  Dr. Jeanie Sewer saw the patient  and concurred that the patient's current medical conditions were likely contributing to his behavioral tendencies.  No immediate intervention was  needed.  The patient was instructed to follow up with Dr. Donell Beers as an  outpatient, who is the patient's psychiatrist.   10 - GASTROESOPHAGEAL REFLUX DISEASE:  This was stable throughout admission.  Protonix 40 mg p.o. q.d. was continued.   DISCHARGE LABORATORY DATA AND X-RAY FINDINGS:  Sodium 129, potassium 4.3,  chloride 103, bicarb 18, BUN 19, creatinine 1.9, glucose 119.  Total  bilirubin 1.1, Alk pos 72, AST 489, ALT 443, total protein 6.2, albumin 2.3,  calcium 8.0.  CBC showed a white count of 5.4, hemoglobin 8.8, hematocrit  25.8 and platelet count of 148.  Barney Drain, M.D.                       Alvester Morin, M.D.    SS/MEDQ  D:  10/19/2001  T:  10/21/2001  Job:  [id number]   cc:   Max Ray  Onnie Boer, M.D.  [address]  [place], [place] [address]  Fax: [phone]

## 2010-05-21 NOTE — Consult Note (Signed)
NAME:  Max Ray, Max Ray                          ACCOUNT NO.:  [id number]   MEDICAL RECORD NO.:  [id number]                   PATIENT TYPE:  EMS   LOCATION:  MAJO                                 FACILITY:  [facility]   PHYSICIAN:  Gabrielle Dare. Janee Morn, M.D.             DATE OF BIRTH:  [birth year]   DATE OF CONSULTATION:  05/28/2002  DATE OF DISCHARGE:                                   CONSULTATION   REASON FOR CONSULTATION:  Blood per rectum.   HISTORY OF PRESENT ILLNESS:  The patient is a 50 year old African American  male known to Dr. Avel Peace from our practice who is status post  excision of anal condylomata on May 21, 2002.  I was asked to evaluate him  for bleeding from the rectum by Dr. Doug Sou.  The patient claims  earlier this morning he developed some crampy lower abdominal pain and  increased passage of gas.  He felt some urgent need to go the bathroom and  he passed a bloody bowel movement.  He has since passed three or four more  over the past  2-3 hours.  He was initially going to see Dr. Abbey Chatters in his office today  but diverted over to the emergency department while he was en route due to  some increased bleeding.  Currently, he is complaining of some crampy lower  abdominal pain and otherwise there are no other complaints.   PAST MEDICAL HISTORY:  HIV positive.   PAST SURGICAL HISTORY:  Resection of anal condylomata on May 21, 2002 as  described.   MEDICATIONS:  The patient's primary physician is Dr. [name] at  [facility] and he is on triple antiretroviral therapy but denies any other  medications.   ALLERGIES:  None known.   REVIEW OF SYSTEMS:  GENERAL:  He feels okay.  He is a little apprehensive.  CARDIAC:  No complaints.  PULMONARY:  No complaints.  GI:  See the history  of present illness.  MUSCULOSKELETAL:  No complaints.  The rest of the  review of systems is negative.   PHYSICAL EXAMINATION:  VITAL SIGNS:  His pulse is 121, blood  pressure  126/85, respirations 20, saturation is 99% on room air.  GENERAL:  He is awake and alert in no acute distress.  HEENT:  EYES:  Pupils are equal, round, and reactive.  Extraocular muscles  are intact.  Sclerae are nonicteric.  LUNGS:  Clear to auscultation bilaterally.  CARDIOVASCULAR:  Heart is regular rate and rhythm.  Point of maximum impulse  is palpable in the left chest.  Peripheral pulses are full although his  pulse rate is in the 110s on my exam.  ABDOMEN:  Soft, nontender, nondistended.  He has very active bowel sounds.  RECTAL:  Exam reveals no rectal masses palpable.  He has no evidence of  postoperative infection but bloody discharge is definitely coming from high  within  the rectum or more proximal as he has some frankly bloody stool on  digital rectal exam.  EXTREMITIES:  5+ strength upper and lower bilaterally.   LABORATORY DATA:  Hemoglobin on arrival was 13.9 and since that time, he has  received 2-1/5 L of fluid.   IMPRESSION:  Lower gastrointestinal bleed.  This is likely unrelated to his  surgery and most likely secondary to an infectious colitis process,  considering his human immunodeficiency virus history.  I would recommend the  following:  Admission to the medical service following resuscitation  transfusion as required, gastroenterology consult, and we will follow  peripherally for need for surgical intervention and I discussed this with  Dr. Abbey Chatters.   Thank you very much for this consultation.                                                Gabrielle Dare Janee Morn, M.D.    BET/MEDQ  D:  05/28/2002  T:  05/28/2002  Job:  [id number]

## 2010-05-22 LAB — COMPLETE METABOLIC PANEL WITH GFR
CO2: 25 mEq/L (ref 19–32)
Creat: 1.28 mg/dL (ref 0.40–1.50)
GFR, Est African American: >60 mL/min (ref >60)
GFR, Est Non African American: 60 mL/min — ABNORMAL LOW (ref >60)
Glucose, Bld: 80 mg/dL (ref 70–99)
Sodium: 140 mEq/L (ref 135–145)
Total Bilirubin: 0.5 mg/dL (ref 0.3–1.2)
Total Protein: 7.6 g/dL (ref 6.0–8.3)

## 2010-05-22 LAB — CBC WITH DIFFERENTIAL/PLATELET
Eosinophils Absolute: 0.3 10*3/uL (ref 0.0–0.7)
Eosinophils Relative: 4 % (ref 0–5)
HCT: 43 % (ref 39.0–52.0)
Hemoglobin: 15 g/dL (ref 13.0–17.0)
Lymphs Abs: 2.3 10*3/uL (ref 0.7–4.0)
MCH: 35 pg — ABNORMAL HIGH (ref 26.0–34.0)
MCV: 100.2 fL — ABNORMAL HIGH (ref 78.0–100.0)
Monocytes Absolute: 0.7 10*3/uL (ref 0.1–1.0)
Monocytes Relative: 10 % (ref 3–12)
RBC: 4.29 MIL/uL (ref 4.22–5.81)

## 2010-05-22 LAB — GC/CHLAMYDIA PROBE AMP, URINE
Chlamydia, Swab/Urine, PCR: NEGATIVE
GC Probe Amp, Urine: NEGATIVE

## 2010-05-22 LAB — LIPID PANEL
Cholesterol: 221 mg/dL — ABNORMAL HIGH (ref 0–200)
VLDL: 27 mg/dL (ref 0–40)

## 2010-06-04 ENCOUNTER — Ambulatory Visit: Payer: Medicare Other | Admitting: Adult Health

## 2010-08-16 ENCOUNTER — Encounter: Payer: Self-pay | Admitting: Adult Health

## 2010-08-16 ENCOUNTER — Telehealth: Payer: Self-pay | Admitting: *Deleted

## 2010-08-16 ENCOUNTER — Ambulatory Visit (INDEPENDENT_AMBULATORY_CARE_PROVIDER_SITE_OTHER): Payer: Medicare Other | Admitting: Adult Health

## 2010-08-16 DIAGNOSIS — Z79899 Other long term (current) drug therapy: Secondary | ICD-10-CM

## 2010-08-16 DIAGNOSIS — B20 Human immunodeficiency virus [HIV] disease: Secondary | ICD-10-CM

## 2010-08-16 DIAGNOSIS — K056 Periodontal disease, unspecified: Secondary | ICD-10-CM

## 2010-08-16 DIAGNOSIS — Z113 Encounter for screening for infections with a predominantly sexual mode of transmission: Secondary | ICD-10-CM

## 2010-08-16 DIAGNOSIS — K1370 Unspecified lesions of oral mucosa: Secondary | ICD-10-CM

## 2010-08-16 DIAGNOSIS — K069 Disorder of gingiva and edentulous alveolar ridge, unspecified: Secondary | ICD-10-CM

## 2010-08-16 DIAGNOSIS — K1379 Other lesions of oral mucosa: Secondary | ICD-10-CM

## 2010-08-16 DIAGNOSIS — K122 Cellulitis and abscess of mouth: Secondary | ICD-10-CM

## 2010-08-16 MED ORDER — LIDOCAINE VISCOUS 2 % MT SOLN: OROMUCOSAL | Status: DC

## 2010-08-16 MED ORDER — DOXYCYCLINE HYCLATE 100 MG PO TABS: 100.0000 mg | ORAL_TABLET | Freq: Two times a day (BID) | ORAL | Status: AC

## 2010-08-16 MED ORDER — CHLORHEXIDINE GLUCONATE 0.12 % MT SOLN: 15.0000 mL | Freq: Two times a day (BID) | OROMUCOSAL | Status: AC

## 2010-08-16 NOTE — Telephone Encounter (Signed)
Walked in c/o "abcess" in roof of mouth. Small elevated area on hard palate. States it has been building for 2 days. Woke him out of sleep at 2am today. Took 1000 mg ibuprofen at 8am today. Told him to make sure he takes that with food & take no more than 800 mg. Also c/o knot on r shoulder x 4 wks. appt at 2:30 today to see the NP. To swish & spit warm salt water. Eat food & may take more ibuprofen at 2. He agreed with plan

## 2010-08-17 LAB — COMPLETE METABOLIC PANEL WITH GFR
ALT: 19 U/L (ref 0–53)
AST: 22 U/L (ref 0–37)
Calcium: 9 mg/dL (ref 8.4–10.5)
Chloride: 105 mEq/L (ref 96–112)
Creat: 1.39 mg/dL — ABNORMAL HIGH (ref 0.50–1.35)
Sodium: 140 mEq/L (ref 135–145)
Total Bilirubin: 2.1 mg/dL — ABNORMAL HIGH (ref 0.3–1.2)
Total Protein: 6.6 g/dL (ref 6.0–8.3)

## 2010-08-17 LAB — CBC WITH DIFFERENTIAL/PLATELET
Basophils Absolute: 0 10*3/uL (ref 0.0–0.1)
Eosinophils Relative: 2 % (ref 0–5)
Lymphocytes Relative: 24 % (ref 12–46)
MCV: 99.3 fL (ref 78.0–100.0)
Neutrophils Relative %: 66 % (ref 43–77)
Platelets: 226 10*3/uL (ref 150–400)
RBC: 4.05 MIL/uL — ABNORMAL LOW (ref 4.22–5.81)
RDW: 12.6 % (ref 11.5–15.5)
WBC: 11.5 10*3/uL — ABNORMAL HIGH (ref 4.0–10.5)

## 2010-08-17 LAB — GC/CHLAMYDIA PROBE AMP, URINE
Chlamydia, Swab/Urine, PCR: NEGATIVE
GC Probe Amp, Urine: NEGATIVE

## 2010-08-17 LAB — URINALYSIS, ROUTINE W REFLEX MICROSCOPIC
Nitrite: POSITIVE — AB
Specific Gravity, Urine: 1.036 — ABNORMAL HIGH (ref 1.005–1.030)
Urobilinogen, UA: 2 mg/dL — ABNORMAL HIGH (ref 0.0–1.0)

## 2010-08-17 LAB — T-HELPER CELL (CD4) - (RCID CLINIC ONLY)
CD4 % Helper T Cell: 33 % (ref 33–55)
CD4 T Cell Abs: 930 uL (ref 400–2700)

## 2010-08-17 LAB — URINALYSIS, MICROSCOPIC ONLY
Casts: NONE SEEN
Squamous Epithelial / LPF: NONE SEEN

## 2010-08-17 LAB — LIPID PANEL
LDL Cholesterol: 103 mg/dL — ABNORMAL HIGH (ref 0–99)
VLDL: 31 mg/dL (ref 0–40)

## 2010-08-18 LAB — HIV-1 RNA QUANT-NO REFLEX-BLD: HIV-1 RNA Quant, Log: <1.3 {Log} (ref <1.30)

## 2010-08-30 ENCOUNTER — Ambulatory Visit (INDEPENDENT_AMBULATORY_CARE_PROVIDER_SITE_OTHER): Payer: Medicare Other | Admitting: Adult Health

## 2010-08-30 ENCOUNTER — Encounter: Payer: Self-pay | Admitting: Adult Health

## 2010-08-30 VITALS — BP 132/88 | HR 76 | Temp 97.9°F | Ht 70.0 in | Wt 194.0 lb

## 2010-08-30 DIAGNOSIS — Z23 Encounter for immunization: Secondary | ICD-10-CM

## 2010-08-30 DIAGNOSIS — B20 Human immunodeficiency virus [HIV] disease: Secondary | ICD-10-CM

## 2010-08-30 MED ORDER — EMTRICITAB-RILPIVIR-TENOFOV DF 200-25-300 MG PO TABS: 1.0000 | ORAL_TABLET | Freq: Every day | ORAL | Status: DC

## 2010-08-30 NOTE — Patient Instructions (Signed)
1. Stop Kaletra, and Truvada. 2. Begin Complera one tablet by mouth daily, with a 400-600-calorie meal. 3. Return to clinic in 4 weeks for repeat labs in 6 weeks for followup.

## 2010-09-27 LAB — DIFFERENTIAL
Eosinophils Absolute: 0.2
Lymphocytes Relative: 23
Lymphs Abs: 3.7
Monocytes Relative: 8
Neutro Abs: 10.6 — ABNORMAL HIGH
Neutrophils Relative %: 68

## 2010-09-27 LAB — COMPREHENSIVE METABOLIC PANEL
Albumin: 3.8
Alkaline Phosphatase: 64
BUN: 18
Calcium: 9.6
Creatinine, Ser: 1.2
Glucose, Bld: 101 — ABNORMAL HIGH
Potassium: 3.6
Total Protein: 7.5

## 2010-09-27 LAB — CBC
HCT: 32.2 — ABNORMAL LOW
MCV: 102.1 — ABNORMAL HIGH
Platelets: 229
RDW: 13.2
WBC: 15.8 — ABNORMAL HIGH

## 2010-09-27 LAB — LACTATE DEHYDROGENASE: LDH: 410 — ABNORMAL HIGH

## 2010-09-27 LAB — CK: Total CK: 138

## 2010-09-27 LAB — RETICULOCYTES
RBC.: 3.16 — ABNORMAL LOW
Retic Count, Absolute: 132.7
Retic Ct Pct: 4.2 — ABNORMAL HIGH

## 2010-09-27 LAB — BILIRUBIN, DIRECT: Bilirubin, Direct: 0.5 — ABNORMAL HIGH

## 2010-09-27 LAB — T-HELPER CELL (CD4) - (RCID CLINIC ONLY): CD4 T Cell Abs: 930

## 2010-09-27 LAB — SEDIMENTATION RATE: Sed Rate: 24 — ABNORMAL HIGH

## 2010-09-30 LAB — T-HELPER CELL (CD4) - (RCID CLINIC ONLY): CD4 T Cell Abs: 690

## 2010-10-04 LAB — CBC
HCT: 46.1
Hemoglobin: 15.9
MCV: 100.5 — ABNORMAL HIGH
Platelets: 314
RDW: 13

## 2010-10-04 LAB — DIFFERENTIAL
Basophils Absolute: 0
Eosinophils Absolute: 0.3
Eosinophils Relative: 3
Lymphocytes Relative: 28
Monocytes Absolute: 0.8

## 2010-10-04 LAB — POCT I-STAT, CHEM 8
BUN: 18
Calcium, Ion: 0.99 — ABNORMAL LOW
Creatinine, Ser: 1.4
Hemoglobin: 16.3
TCO2: 29

## 2010-10-04 LAB — T-HELPER CELL (CD4) - (RCID CLINIC ONLY): CD4 % Helper T Cell: 31 — ABNORMAL LOW

## 2010-10-04 LAB — BASIC METABOLIC PANEL
BUN: 12
Calcium: 8.9
Creatinine, Ser: 1.14
GFR calc Af Amer: >60
GFR calc non Af Amer: >60

## 2010-10-04 LAB — RAPID URINE DRUG SCREEN, HOSP PERFORMED
Amphetamines: NOT DETECTED
Barbiturates: NOT DETECTED
Tetrahydrocannabinol: POSITIVE — AB

## 2010-10-04 LAB — CK TOTAL AND CKMB (NOT AT ARMC): Total CK: 150

## 2010-10-04 LAB — POCT CARDIAC MARKERS
CKMB, poc: <1 — ABNORMAL LOW
Troponin i, poc: <0.05

## 2010-10-12 LAB — URINALYSIS, ROUTINE W REFLEX MICROSCOPIC
Glucose, UA: NEGATIVE
Protein, ur: NEGATIVE
Urobilinogen, UA: 0.2

## 2010-10-12 LAB — DIFFERENTIAL
Basophils Relative: 1
Monocytes Relative: 9
Neutro Abs: 6.1
Neutrophils Relative %: 56

## 2010-10-12 LAB — COMPREHENSIVE METABOLIC PANEL
Alkaline Phosphatase: 72
BUN: 12
Calcium: 9
Creatinine, Ser: 1.31
Glucose, Bld: 93
Potassium: 4
Total Protein: 7.4

## 2010-10-12 LAB — POCT CARDIAC MARKERS
Myoglobin, poc: 85.2
Operator id: 146091
Operator id: 146091
Troponin i, poc: <0.05

## 2010-10-12 LAB — CBC
HCT: 44.1
Hemoglobin: 15.2
MCHC: 34.5
MCV: 100
RDW: 12.1

## 2010-10-14 LAB — T-HELPER CELL (CD4) - (RCID CLINIC ONLY): CD4 T Cell Abs: 740

## 2010-10-20 LAB — T-HELPER CELL (CD4) - (RCID CLINIC ONLY): CD4 % Helper T Cell: 21 — ABNORMAL LOW

## 2010-11-04 ENCOUNTER — Telehealth: Payer: Self-pay | Admitting: *Deleted

## 2010-11-04 NOTE — Telephone Encounter (Signed)
States he has had symptoms of a sinus infection for 2 weeks. Denies fever . Using otc pills & spray. Nose is very stuffy & he is mouth breathing. States he feels bad. Refuses to get a pcp. States we are his pcp. No appts left today. One tomorrow. He will be here at 11am Friday to see Dr. Orvan Falconer. Please reinforce the need for a pcp. He does have insurance

## 2010-11-05 ENCOUNTER — Encounter: Payer: Self-pay | Admitting: Internal Medicine

## 2010-11-05 ENCOUNTER — Ambulatory Visit (INDEPENDENT_AMBULATORY_CARE_PROVIDER_SITE_OTHER): Payer: Medicare Other | Admitting: Internal Medicine

## 2010-11-05 VITALS — BP 135/89 | HR 89 | Temp 97.7°F | Ht 71.0 in | Wt 192.0 lb

## 2010-11-05 DIAGNOSIS — B20 Human immunodeficiency virus [HIV] disease: Secondary | ICD-10-CM

## 2010-11-05 DIAGNOSIS — J019 Acute sinusitis, unspecified: Secondary | ICD-10-CM

## 2010-11-05 DIAGNOSIS — J301 Allergic rhinitis due to pollen: Secondary | ICD-10-CM

## 2010-11-05 DIAGNOSIS — Z23 Encounter for immunization: Secondary | ICD-10-CM

## 2010-11-05 LAB — T-HELPER CELL (CD4) - (RCID CLINIC ONLY): CD4 % Helper T Cell: 33 % (ref 33–55)

## 2010-11-05 MED ORDER — DOXYCYCLINE HYCLATE 100 MG PO TABS: 100.0000 mg | ORAL_TABLET | Freq: Two times a day (BID) | ORAL | Status: AC

## 2010-11-05 MED ORDER — MOMETASONE FUROATE 50 MCG/ACT NA SUSP: 2.0000 | Freq: Every day | NASAL | Status: DC

## 2010-11-05 NOTE — Assessment & Plan Note (Signed)
I will start him back on his Nasonex and have encouraged him to wean himself off of the phenylephrine over the next few days because it is almost certainly causing rebound congestion.

## 2010-11-05 NOTE — Assessment & Plan Note (Signed)
He may have superimposed acute bacterial sinusitis. I will treat him with a 5 day course of oral doxycycline.

## 2010-11-05 NOTE — Progress Notes (Signed)
  Subjective:    Patient ID: Max Ray , male    DOB: 1960-09-22, 50 y.o.   MRN: [id number]  HPI Max Ray has been have been acute on chronic sinus congestion for the past 2 weeks. He has a history of seasonal allergies but has been off of his nasal steroid spray he for many years. He had taken Claritin in the past but did not feel that this helped very much. He used to use a nettipot but does not have all of the equipment now. He also says that he has been bothered by recurrent sinusitis.  He has been using phenylephrine nasal spray for the last 2 weeks and finds it only gives him temporary relief. He has been sneezing a lot and has occasional purulent nasal discharge. He has not had any fever, cough, or shortness of breath.  He started complaining her a one month ago and has tolerated it well. He does take it with a meal. He does not believe he is missed any doses.    Review of Systems     Objective:   Physical Exam  Constitutional: He appears well-developed and well-nourished. No distress.  HENT:  Mouth/Throat: Oropharynx is clear and moist. No oropharyngeal exudate.       His eyelids are slightly puffy more so on the left than on the right. His nasal mucosa is erythematous and constricted.  Cardiovascular: Normal rate, regular rhythm and normal heart sounds.   No murmur heard. Pulmonary/Chest: Breath sounds normal. He has no wheezes. He has no rales.  Skin: No rash noted.  Psychiatric: He has a normal mood and affect.          Assessment & Plan:

## 2010-11-05 NOTE — Assessment & Plan Note (Signed)
His last CD4 count was normal at 930 and his viral load was undetectable at less than 20 but these labs were before starting complaining. I will repeat his lab work today.

## 2010-11-09 LAB — HIV-1 RNA QUANT-NO REFLEX-BLD

## 2010-11-30 ENCOUNTER — Ambulatory Visit (INDEPENDENT_AMBULATORY_CARE_PROVIDER_SITE_OTHER): Payer: Medicare Other | Admitting: Internal Medicine

## 2010-11-30 ENCOUNTER — Telehealth: Payer: Self-pay | Admitting: *Deleted

## 2010-11-30 ENCOUNTER — Emergency Department (HOSPITAL_COMMUNITY): Payer: Medicare Other

## 2010-11-30 ENCOUNTER — Inpatient Hospital Stay (HOSPITAL_COMMUNITY)
Admission: EM | Admit: 2010-11-30 | Discharge: 2010-12-04 | DRG: 682 | Disposition: A | Discharge status: Home / Self Care | Payer: Medicare Other | Attending: Family Medicine | Admitting: Family Medicine

## 2010-11-30 ENCOUNTER — Encounter: Payer: Self-pay | Admitting: Internal Medicine

## 2010-11-30 ENCOUNTER — Encounter (HOSPITAL_COMMUNITY): Payer: Self-pay | Admitting: *Deleted

## 2010-11-30 DIAGNOSIS — R51 Headache: Secondary | ICD-10-CM | POA: Diagnosis not present

## 2010-11-30 DIAGNOSIS — J3489 Other specified disorders of nose and nasal sinuses: Secondary | ICD-10-CM | POA: Diagnosis present

## 2010-11-30 DIAGNOSIS — R42 Dizziness and giddiness: Secondary | ICD-10-CM | POA: Diagnosis not present

## 2010-11-30 DIAGNOSIS — Z79899 Other long term (current) drug therapy: Secondary | ICD-10-CM

## 2010-11-30 DIAGNOSIS — B20 Human immunodeficiency virus [HIV] disease: Secondary | ICD-10-CM

## 2010-11-30 DIAGNOSIS — H8110 Benign paroxysmal vertigo, unspecified ear: Secondary | ICD-10-CM

## 2010-11-30 DIAGNOSIS — R4789 Other speech disturbances: Secondary | ICD-10-CM | POA: Diagnosis present

## 2010-11-30 DIAGNOSIS — G459 Transient cerebral ischemic attack, unspecified: Secondary | ICD-10-CM

## 2010-11-30 DIAGNOSIS — Z8619 Personal history of other infectious and parasitic diseases: Secondary | ICD-10-CM

## 2010-11-30 DIAGNOSIS — N179 Acute kidney failure, unspecified: Principal | ICD-10-CM | POA: Diagnosis present

## 2010-11-30 HISTORY — DX: Human immunodeficiency virus (HIV) disease: B20

## 2010-11-30 HISTORY — DX: Syphilis, unspecified: A53.9

## 2010-11-30 LAB — DIFFERENTIAL
Basophils Absolute: 0 10*3/uL (ref 0.0–0.1)
Basophils Relative: 0 % (ref 0–1)
Eosinophils Absolute: 0.2 10*3/uL (ref 0.0–0.7)
Eosinophils Relative: 2 % (ref 0–5)
Monocytes Absolute: 0.7 10*3/uL (ref 0.1–1.0)
Neutro Abs: 5.8 10*3/uL (ref 1.7–7.7)

## 2010-11-30 LAB — COMPREHENSIVE METABOLIC PANEL
AST: 60 U/L — ABNORMAL HIGH (ref 0–37)
Albumin: 3.8 g/dL (ref 3.5–5.2)
Calcium: 8.9 mg/dL (ref 8.4–10.5)
Chloride: 105 mEq/L (ref 96–112)
Creatinine, Ser: 1.41 mg/dL — ABNORMAL HIGH (ref 0.50–1.35)
Total Protein: 7.4 g/dL (ref 6.0–8.3)

## 2010-11-30 LAB — CBC
HCT: 43.9 % (ref 39.0–52.0)
MCH: 34.9 pg — ABNORMAL HIGH (ref 26.0–34.0)
MCHC: 36.4 g/dL — ABNORMAL HIGH (ref 30.0–36.0)
RDW: 11.7 % (ref 11.5–15.5)

## 2010-11-30 LAB — URINALYSIS, ROUTINE W REFLEX MICROSCOPIC
Ketones, ur: NEGATIVE mg/dL
Protein, ur: NEGATIVE mg/dL
Urobilinogen, UA: 1 mg/dL (ref 0.0–1.0)

## 2010-11-30 LAB — CARDIAC PANEL(CRET KIN+CKTOT+MB+TROPI)
CK, MB: 2.2 ng/mL (ref 0.3–4.0)
Relative Index: 0.9 (ref 0.0–2.5)
Total CK: 244 U/L — ABNORMAL HIGH (ref 7–232)
Troponin I: <0.3 ng/mL (ref <0.30)

## 2010-11-30 LAB — URINE MICROSCOPIC-ADD ON

## 2010-11-30 MED ORDER — SODIUM CHLORIDE 0.9 % IV SOLN: 250.0000 mL | INTRAVENOUS | Status: DC | PRN

## 2010-11-30 MED ORDER — SODIUM CHLORIDE 0.9 % IJ SOLN
3.0000 mL | Freq: Two times a day (BID) | INTRAMUSCULAR | Status: DC
  Administered 2010-11-30 – 2010-12-04 (×7): 3 mL via INTRAVENOUS

## 2010-11-30 MED ORDER — ONDANSETRON HCL 4 MG/2ML IJ SOLN: 4.0000 mg | Freq: Four times a day (QID) | INTRAMUSCULAR | Status: DC | PRN

## 2010-11-30 MED ORDER — SODIUM CHLORIDE 0.9 % IJ SOLN: 3.0000 mL | INTRAMUSCULAR | Status: DC | PRN

## 2010-11-30 MED ORDER — SENNOSIDES-DOCUSATE SODIUM 8.6-50 MG PO TABS: 1.0000 | ORAL_TABLET | Freq: Every evening | ORAL | Status: DC | PRN

## 2010-11-30 MED ORDER — EMTRICITAB-RILPIVIR-TENOFOV DF 200-25-300 MG PO TABS: 1.0000 | ORAL_TABLET | Freq: Every day | ORAL | Status: DC

## 2010-11-30 MED ORDER — SODIUM CHLORIDE 0.9 % IV SOLN
INTRAVENOUS | Status: DC
  Administered 2010-11-30 – 2010-12-02 (×3): via INTRAVENOUS

## 2010-11-30 MED ORDER — GADOBENATE DIMEGLUMINE 529 MG/ML IV SOLN
19.0000 mL | Freq: Once | INTRAVENOUS | Status: AC | PRN
  Administered 2010-11-30: 19 mL via INTRAVENOUS

## 2010-11-30 MED ORDER — HEPARIN SODIUM (PORCINE) 5000 UNIT/ML IJ SOLN
5000.0000 [IU] | Freq: Three times a day (TID) | INTRAMUSCULAR | Status: DC
  Administered 2010-12-01 – 2010-12-04 (×10): 5000 [IU] via SUBCUTANEOUS
  Filled 2010-11-30 (×14): qty 1

## 2010-11-30 MED ORDER — LORATADINE 10 MG PO TABS
10.0000 mg | ORAL_TABLET | Freq: Every day | ORAL | Status: DC
  Administered 2010-12-01 – 2010-12-04 (×4): 10 mg via ORAL
  Filled 2010-11-30 (×4): qty 1

## 2010-11-30 NOTE — Progress Notes (Signed)
  Subjective:    Patient ID: Max Ray, male    DOB: February 17, 1960, 50 y.o.   MRN: [id number]  HPI she comes in here as a walk-in to our clinic for an urgent evaluation. The patient tells me that he will cut last night with left arm numbness and tingling that he has never experienced in the past. He tells me that and initially he thought he had slept wrong however woke up again in the morning at his usual time and it was persistent. He tells me that throughout the day he's had worsening numbness and tingling of his arm and difficulty using it. He also endorses an episode of slurred speech that has since resolved. He also reports some numbness of his lower lip. Additional history reveals about 3 days ago he had an episode of substernal chest pain that resolved with rest and significant headache at that time. Otherwise he tells me that he has never had episodes like this in the past and normally his blood pressure when checked has been normal. He does not take an aspirin.    Review of Systems  Constitutional: Negative for fever, chills and fatigue.  HENT: Positive for sinus pressure.   Gastrointestinal: Negative for nausea.  Musculoskeletal: Negative for gait problem.       Left arm tingling  Neurological: Negative for facial asymmetry and headaches.  Psychiatric/Behavioral: Negative for dysphoric mood.       Objective:   Physical Exam  Constitutional: He is oriented to person, place, and time. He appears well-developed and well-nourished. No distress.  Cardiovascular: Normal rate, regular rhythm and normal heart sounds.  Exam reveals no gallop and no friction rub.   No murmur heard. Pulmonary/Chest: Effort normal and breath sounds normal. He has no wheezes.  Abdominal: Soft. Bowel sounds are normal. There is no tenderness. There is no rebound.  Neurological: He is alert and oriented to person, place, and time.       Decreased movement of left hand, no other focal deficits  Skin: Skin is  warm and dry.  Psychiatric: He has a normal mood and affect. His behavior is normal.          Assessment & Plan:

## 2010-11-30 NOTE — ED Provider Notes (Signed)
History     CSN: [id number] Arrival date & time: 11/30/2010  2:57 PM   First MD Initiated Contact with Patient 11/30/10 1505      Chief Complaint  Patient presents with  . Weakness    (Consider location/radiation/quality/duration/timing/severity/associated sxs/prior treatment) HPI Comments: Patient has history of HIV.  These symptoms started last night at 11 PM.  No injury or trauma.  No new activities or medications.  Was seen by ID doctor who then sent him here for more testing.  Patient is a 50 y.o. male presenting with weakness. The history is provided by the patient.  Weakness The primary symptoms include focal weakness, loss of sensation and speech change. Primary symptoms do not include headaches, syncope, loss of consciousness, seizures, dizziness, visual change, fever, nausea or vomiting. The symptoms began 12 to 24 hours ago. The symptoms are unchanged. The neurological symptoms are focal.  Additional symptoms include weakness. Additional symptoms do not include neck stiffness, lower back pain or vertigo.    Past Medical History  Diagnosis Date  . HIV disease   . Genital herpes   . Syphilis     History reviewed. No pertinent past surgical history.  History reviewed. No pertinent family history.  History  Substance Use Topics  . Smoking status: Never Smoker   . Smokeless tobacco: Never Used  . Alcohol Use: 2.0 oz/week    4 drink(s) per week     occasional      Review of Systems  Constitutional: Positive for fatigue. Negative for fever.  HENT: Negative for neck stiffness.   Cardiovascular: Negative for syncope.  Gastrointestinal: Negative for nausea and vomiting.  Neurological: Positive for speech change, focal weakness, speech difficulty and weakness. Negative for dizziness, vertigo, seizures, loss of consciousness and headaches.  All other systems reviewed and are negative.    Allergies  Sulfamethoxazole w/trimethoprim and Sulfonamide  derivatives  Home Medications   Current Outpatient Rx  Name Route Sig Dispense Refill  . EMTRICITAB-RILPIVIR-TENOFOVIR 200-25-300 MG PO TABS Oral Take 1 tablet by mouth daily. With a 400-600-calorie meal. 30 tablet 11    BP 129/84  Pulse 102  Temp(Src) 98.1 F (36.7 C) (Oral)  Resp 18  SpO2 98%  Physical Exam  Constitutional: He is oriented to person, place, and time. He appears well-developed and well-nourished. No distress.  HENT:  Head: Normocephalic and atraumatic.  Mouth/Throat: Oropharynx is clear and moist.  Eyes: EOM are normal. Pupils are equal, round, and reactive to light.  Neck: Normal range of motion. Neck supple.  Cardiovascular: Normal rate and regular rhythm.  Exam reveals no gallop and no friction rub.   No murmur heard. Pulmonary/Chest: Effort normal and breath sounds normal. No respiratory distress.  Abdominal: Soft. Bowel sounds are normal. He exhibits no distension. There is no tenderness.  Musculoskeletal: Normal range of motion. He exhibits no edema and no tenderness.  Neurological: He is alert and oriented to person, place, and time. No cranial nerve deficit.       Strength is 4+/5 in the left upper extremity.  5/5 in the [place].  There is no weakness in the right arm or leg.  Coordination seems intact and speech does not sound slurred to me.    Skin: Skin is warm and dry. He is not diaphoretic.    ED Course  Procedures (including critical care time)  Labs Reviewed - No data to display No results found.   No diagnosis found.   Date: 11/30/2010  Rate: 78  Rhythm:  normal sinus rhythm  QRS Axis: normal  Intervals: normal  ST/T Wave abnormalities: normal  Conduction Disutrbances:none  Narrative Interpretation:   Old EKG Reviewed: unchanged    MDM  Symptoms are consistent with tia, seems to be improving but has not resolved.  Workup is unremarkable, except for small lesion of the ct which is old.  Patient is somewhat uneasy about his situation.   Will call medicine about possible admission for workup and rule out of cva.        Geoffery Lyons, MD 11/30/10 [phone]

## 2010-11-30 NOTE — Telephone Encounter (Signed)
He came in stating his L arm was numb starting at 1am today. Had a brief episode of CP about 5 days ago that lasted 1 second. Located midsternal. C/o SOB x 2 weeks. He is very concerned & wants to be seen. States the L arm is getting progressively numb-er. Vitals taken. He will see Dr. Luciana Axe

## 2010-11-30 NOTE — Assessment & Plan Note (Addendum)
The patient's HIV is well controlled however is having this symptoms consistent concerning for a stroke and therefore he has been referred to the emergency room via EMS. Her for that he will have further evaluation and recommendations per evaluation.his blood pressure is high and he did have an episode of chest pain this is all concerning versus a 4 at CVA versus cardiac origin versus other.

## 2010-11-30 NOTE — ED Notes (Signed)
Patient resting comfortably on stretcher. Family member at bedside.

## 2010-11-30 NOTE — ED Notes (Signed)
Pt mentioned diagnosis and disappearance of brain tumors via MRI several years back.  I informed pt's RN-Greg and Dr. Judd Lien.

## 2010-11-30 NOTE — ED Notes (Signed)
Patient states he started having left arm numbness onset last pm states this am he noticed it was getting progressively worse. States he is talking slow however speech appears clear to me . Went to his MD office and after being seen by MD was seen to ed for further eval.

## 2010-11-30 NOTE — ED Notes (Signed)
Patient states he knows that his  Speech is slow and his left arm is weaker than right. Grips appears equal.  bilaterally

## 2010-11-30 NOTE — H&P (Signed)
Max Ray is an 50 y.o. male.    PCP: [facility] for Infectious Disease Chief Complaint: left hand weakness, numbness HPI: This is a 50 YO M with a history of HIV (diagnosed 15 years ago) presenting with left hand weakness. Started acutely last night around 11 pm when he felt his left hand go weak and also had decreased sensation in that left hand. Weakness has been gradually worsening since then. Also experienced some slurred speech at the same time that has improved but still does not feel fully normal. He says he is a fast talker normally and when he finds it difficult getting his words out when he is talking faster. Also felt his gait was "wobbly", and he had to "catch himself" a few times and sit down. Denies previous similar episodes.  He saw Dr. Luciana Axe at the ID clinic this afternoon. He was asked to come to the ED due to concern for TIA/stroke.   ROS:  Gen: Denies fevers/chills Neuro: Denies lightheadedness, vertigo. Denies difficulty swallowing/choking, denies headache. Resp:   -Complaining of worsening shortness-of-breath for the past couple of months   -Sinus congestion for the past few weeks. Saw Dr. Orvan Falconer at the ID clinic on 11/02 who gave him 5 days of doxycycline. Felt his symptoms improve during those 5 days but now continues to have about similar symptoms (sinus congestion/pressure) CV: Episode of chest pain about 2 days ago, substernal, onset while ambulating, lasted 15 minutes. Resolved spontaneously. Not sure if sitting down helped the chest pain GI: Denies nausea/vomiting/diarrhea/constipation   Past Medical History  Diagnosis Date  . HIV disease   . Genital herpes   . Syphilis   G6PD deficiency ED Sinusitis, allergic rhinitis  Hospitalized in the past for:   -meningococcemia: hospitalized for 15 days   -dehydration   History reviewed. No pertinent past surgical history. No surgeries.  History reviewed. No pertinent family history. Social  History:  reports that he has never smoked. He has never used smokeless tobacco. He reports that he drinks about 2 ounces of alcohol per week. He reports that he does not use illicit drugs. 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  Allergies:  Allergies  Allergen Reactions  . Sulfamethoxazole W/Trimethoprim Other (See Comments)    Unknown allergic reaction  . Sulfonamide Derivatives     No current facility-administered medications on file as of 11/30/2010.   Medications Prior to Admission  Medication Sig Dispense Refill  . Emtricitab-Rilpivir-Tenofovir (COMPLERA) 200-25-300 MG TABS Take 1 tablet by mouth daily. With a 400-600-calorie meal.  30 tablet  11    Results for orders placed during the hospital encounter of 11/30/10 (from the past 48 hour(s))  CARDIAC PANEL(CRET KIN+CKTOT+MB+TROPI)     Status: Abnormal   Collection Time   11/30/10  4:15 PM      Component Value Range Comment   Total CK 244 (*) 7 - 232 (U/L)    CK, MB 2.2  0.3 - 4.0 (ng/mL)    Troponin I <0.30  <0.30 (ng/mL)    Relative Index 0.9  0.0 - 2.5    CBC     Status: Abnormal   Collection Time   11/30/10  4:18 PM      Component Value Range Comment   WBC 8.9  4.0 - 10.5 (K/uL)    RBC 4.58  4.22 - 5.81 (MIL/uL)    Hemoglobin 16.0  13.0 - 17.0 (g/dL)  HCT 43.9  39.0 - 52.0 (%)    MCV 95.9  78.0 - 100.0 (fL)    MCH 34.9 (*) 26.0 - 34.0 (pg)    MCHC 36.4 (*) 30.0 - 36.0 (g/dL)    RDW 16.1  09.6 - 04.5 (%)    Platelets 240  150 - 400 (K/uL)   DIFFERENTIAL     Status: Normal   Collection Time   11/30/10  4:18 PM      Component Value Range Comment   Neutrophils Relative 65  43 - 77 (%)    Neutro Abs 5.8  1.7 - 7.7 (K/uL)    Lymphocytes Relative 26  12 - 46 (%)    Lymphs Abs 2.3  0.7 - 4.0 (K/uL)    Monocytes Relative 7  3 - 12 (%)    Monocytes Absolute 0.7  0.1 - 1.0 (K/uL)    Eosinophils Relative 2  0 - 5 (%)    Eosinophils Absolute 0.2  0.0 - 0.7 (K/uL)    Basophils  Relative 0  0 - 1 (%)    Basophils Absolute 0.0  0.0 - 0.1 (K/uL)   COMPREHENSIVE METABOLIC PANEL     Status: Abnormal   Collection Time   11/30/10  4:18 PM      Component Value Range Comment   Sodium 139  135 - 145 (mEq/L)    Potassium 4.0  3.5 - 5.1 (mEq/L)    Chloride 105  96 - 112 (mEq/L)    CO2 26  19 - 32 (mEq/L)    Glucose, Bld 97  70 - 99 (mg/dL)    BUN 11  6 - 23 (mg/dL)    Creatinine, Ser 4.09 (*) 0.50 - 1.35 (mg/dL)    Calcium 8.9  8.4 - 10.5 (mg/dL)    Total Protein 7.4  6.0 - 8.3 (g/dL)    Albumin 3.8  3.5 - 5.2 (g/dL)    AST 60 (*) 0 - 37 (U/L) SLIGHT HEMOLYSIS   ALT 77 (*) 0 - 53 (U/L)    Alkaline Phosphatase 78  39 - 117 (U/L)    Total Bilirubin 0.6  0.3 - 1.2 (mg/dL)    GFR calc non Af Amer 57 (*) >90 (mL/min)    GFR calc Af Amer 66 (*) >90 (mL/min)   URINALYSIS, ROUTINE W REFLEX MICROSCOPIC     Status: Abnormal   Collection Time   11/30/10  4:30 PM      Component Value Range Comment   Color, Urine YELLOW  YELLOW     Appearance CLEAR  CLEAR     Specific Gravity, Urine 1.007  1.005 - 1.030     pH 7.5  5.0 - 8.0     Glucose, UA NEGATIVE  NEGATIVE (mg/dL)    Hgb urine dipstick NEGATIVE  NEGATIVE     Bilirubin Urine NEGATIVE  NEGATIVE     Ketones, ur NEGATIVE  NEGATIVE (mg/dL)    Protein, ur NEGATIVE  NEGATIVE (mg/dL)    Urobilinogen, UA 1.0  0.0 - 1.0 (mg/dL)    Nitrite NEGATIVE  NEGATIVE     Leukocytes, UA TRACE (*) NEGATIVE    URINE MICROSCOPIC-ADD ON     Status: Normal   Collection Time   11/30/10  4:30 PM      Component Value Range Comment   Squamous Epithelial / LPF RARE  RARE     WBC, UA 0-2  <3 (WBC/hpf)    Bacteria, UA RARE  RARE     Dg Chest  2 View  11/30/2010  *RADIOLOGY REPORT*  Clinical Data: Shortness of breath.  Weakness.  Left arm numbness. Slurred speech.  CHEST - 2 VIEW  Comparison: 10/29/2007.  Findings: Normal sized heart.  Clear lungs.  Mild central peribronchial thickening with mild progression.  Minimal thoracic spine degenerative  changes.  IMPRESSION: Mild bronchitic changes.  Original Report Authenticated By: Darrol Angel, M.D.   Ct Head Wo Contrast  11/30/2010  *RADIOLOGY REPORT*  Clinical Data: Slurred speech and left arm weakness.  CT HEAD WITHOUT CONTRAST  Technique:  Contiguous axial images were obtained from the base of the skull through the vertex without contrast.  Comparison: Head CT 05/25/2006.  Findings: The ventricles are normal and stable.  No extra-axial fluid collections are identified.  No CT findings for acute hemispheric infarction or intracranial hemorrhage.  There is a small hyperdensity noted on the right side near the posterior aspect of the right sylvian fissure.  This could be arachnoid granulations in the choroid fissure.  It does appear slightly more prominent when compared the prior head CT.  It is likely a benign finding.  I would however recommend an MRI without and with contrast for further evaluation (non urgent).  The brainstem and cerebellum appear normal.  The bony structures are intact.  The paranasal sinuses and mastoid air cells are clear.  IMPRESSION:  1.  No CT findings for acute intracranial process. 2.  Small hyperdense lesion likely in or near the right choroidal fissure.  This is slightly larger and more pronounced than it was on the prior head CT.  Recommend non urgent follow-up MRI without and with contrast.  Original Report Authenticated By: P. Loralie Champagne, M.D.    ROS See above.  Blood pressure 129/84, pulse 102, temperature 98.1 F (36.7 C), temperature source Oral, resp. rate 18, SpO2 98.00%. Physical Exam  Gen: NAD Psych: conversant, engaged, appropriate HEENT:   Head: AT/[place]   Eyes: PERRLA, EOMI, conjunctiva normal, wearing colored contacts   Nose: No nasal congestion/discharge   Ears: TM normal bilaterally   Oropharynx: no tonsillar adenopathy, plaques/erythema/or other lesions   Neck: no LAD; supple; full ROM CV: RRR, no m/r/g Pulm: CTAB, no w/r/r, no increased  WOB Abd: NABS, soft, NT, ND Ext: no edema, 2+ pedal pulses Skin: warm Neuro:    Fully alert and oriented   CN intact   Strength:      Upper extremities: right 5/5, left 4/5 arm and grip strenght      Lower extremities: 4/5 bilaterally   Sensation: intact throughout   Romberg: positive   Gait: slow, mostly steady but occasionally walks in a straight line and wobbles   Speech: no slurring detected  Assessment/Plan This is a 51 YO M with a history of HIV (diagnosed 15 years ago) presenting with left hand weakness, slurred speech, unsteady gait that started acutely last night.  Left hand weakness, slurred speech, unsteady gait Will admit to floor for observation and work-up for TIA/stroke -Will follow-up MRI that has been ordered -Will order MRA head/brain without contrast -Will monitor on tele, perform neuro checks, PT/OT/ST eval, TEE -Will check lipid panel  History of syphilis concerning for neurosyphilis. RPR NR in 08/2010. Last reactive 01/2008. No meningeal signs.  -Will check RPR, FTA-ABS now -Will consider LP in the AM or if above tests positive  Acute kidney injury Cr 1.19 in 04/2007.  -Will give fluids and re-check in the AM  Chest pain 2 days ago. None currently. First set of  cardiac enzymes negative. -Will check ECG  HIV Recently had labs checked 11/05/2010. HIV 1 RNA Quant < 20, CD4 count 700 with 33% Helper T cell. Patient seems to have good compliance with follow-up. -Will consult ID in the AM.   Nasal congestion Viral URI versus allergic rhinitis -Will start Claritin  FEN/GI NS @ 125. Regular diet after passing bedside swallow PPx Heparin SQ for DVT PPx Dispo Pending TIA/stroke work-up   [place] PARK, ANGELA 11/30/2010, 6:43 PM

## 2010-12-01 ENCOUNTER — Observation Stay (HOSPITAL_COMMUNITY): Payer: Medicare Other

## 2010-12-01 ENCOUNTER — Other Ambulatory Visit: Payer: Self-pay

## 2010-12-01 ENCOUNTER — Encounter (HOSPITAL_COMMUNITY): Payer: Self-pay

## 2010-12-01 DIAGNOSIS — M539 Dorsopathy, unspecified: Secondary | ICD-10-CM

## 2010-12-01 DIAGNOSIS — I6350 Cerebral infarction due to unspecified occlusion or stenosis of unspecified cerebral artery: Secondary | ICD-10-CM

## 2010-12-01 DIAGNOSIS — B20 Human immunodeficiency virus [HIV] disease: Secondary | ICD-10-CM

## 2010-12-01 DIAGNOSIS — G459 Transient cerebral ischemic attack, unspecified: Secondary | ICD-10-CM

## 2010-12-01 DIAGNOSIS — R42 Dizziness and giddiness: Secondary | ICD-10-CM

## 2010-12-01 LAB — LIPID PANEL
HDL: 53 mg/dL (ref >39)
LDL Cholesterol: 91 mg/dL (ref 0–99)
Total CHOL/HDL Ratio: 3.2 RATIO
Triglycerides: 138 mg/dL (ref <150)
VLDL: 28 mg/dL (ref 0–40)

## 2010-12-01 MED ORDER — ACETAMINOPHEN 325 MG PO TABS
650.0000 mg | ORAL_TABLET | Freq: Four times a day (QID) | ORAL | Status: DC | PRN
  Administered 2010-12-01 – 2010-12-02 (×2): 650 mg via ORAL
  Filled 2010-12-01 (×2): qty 2

## 2010-12-01 MED ORDER — EMTRICITAB-RILPIVIR-TENOFOV DF 200-25-300 MG PO TABS: 1.0000 | ORAL_TABLET | Freq: Every day | ORAL | Status: DC

## 2010-12-01 MED ORDER — ASPIRIN EC 81 MG PO TBEC
81.0000 mg | DELAYED_RELEASE_TABLET | Freq: Every day | ORAL | Status: DC
  Administered 2010-12-01: 81 mg via ORAL
  Filled 2010-12-01: qty 1

## 2010-12-01 NOTE — Consult Note (Signed)
INFECTIOUS DISEASES CONSULTATION  Reason for Consult: HIV and new onset left arm weakness & slurred speech Date of consultation: 12/01/2010  Max Ray is an 50 y.o. male right-hand dominantwith HIV, originally diagnosed in 1994, recent CD4 count of 700(33%)/ with undetectable VL <20, on emtricitabine/rilpivirine/tenofovir, maintains 100% adherence. He is also  Known to have history of meningococcemia in 2007, PCP PNA in 2003, and hx of syphilis. The patient was in usual state of health until the evening prior to admit when he noticed having LEFT hand tingling and weakness. He thought he might have slept on his arm causing parasthesia. The following morning he still noticed that his finger felt numb and had some difficulty with buttoning his clothes. His symptoms still persisted throughout the day where he noticed hot having fine motor dexterity, i.e. Unable to open the zipper of his book bag, in addition to noticing that his speech was slurred and was not able to speak clearly as he usually does. Due to the new onset of these symptoms, he presented to the ID clinic for evaluation. He was subsequently taken to [facility] ED by EMS for stroke evaluation. His MRI/MRA revealed that he had a right temporal lobe cavernoma with subtle signal intensity within the posterior right sub insular region that  may represent reactive changes to the cavernoma versus small acute infarct.  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 MRA did not show any abnormal vessels in the region of the right temporal lobe cavernoma.  No medium or large sized vessel significant stenosis or occlusion.  Branch vessel irregularity.  Roughly 20 hrs after the onset of his symptoms the patient states that his weakness in his left hand is resolved and slurred speech is resolved. He denies any loss of consciousness, no difficulty lifting objects with left hand. He now does subscribe to vertiginous symptoms with the  room spinning. It was exacerbated when he was laying in bed and moved his head quickly to answer questions. He denies any nausea associated with "spinning of the room" but he is unable to walk without grabbing onto wall for support due to his symptoms. The patient has been seen by neurology consult, and underwent EEG today, reading pending.  All: Allergies  Allergen Reactions  . Sulfamethoxazole W/Trimethoprim Other (See Comments)    Unknown allergic reaction  . Sulfonamide Derivatives     Meds:    . Emtricitab-Rilpivir-Tenofovir  1 tablet Oral Q supper  . heparin  5,000 Units Subcutaneous Q8H  . loratadine  10 mg Oral Daily  . sodium chloride  3 mL Intravenous Q12H  . DISCONTD: aspirin EC  81 mg Oral Daily  . DISCONTD: Emtricitab-Rilpivir-Tenofovir  1 tablet Oral Daily   Past medical history: Patient Active Problem List  Diagnoses  . HIV DISEASE  . GENITAL HERPES  . SYPHILIS  . TINEA CORPORIS  . GLUCOSE-6-PHOSPHATE DEHYDROGENASE DEFICIENCY  . THROMBOCYTOPENIA  . ERECTILE DYSFUNCTION  . PRESBYOPIA  . ACUTE SINUSITIS, UNSPECIFIED  . SORE THROAT  . ALLERGIC RHINITIS, SEASONAL  . CELLULITIS AND ABSCESS OF FACE  . BACK PAIN, LUMBAR  . FATIGUE  . SKIN RASH  . TACHYCARDIA  . FREQUENCY, URINARY  . NOCTURIA  . OTHER ABNORMALITY OF URINATION  . ADVERSE EFFECTS, SIRS, UNSPECIFIED  -  Meningococcemia.  -  Clostridium difficile colitis.  - Depression - History of cryptococcal-positive stool in May 2004.  - History of anal condylomata s/p excision May 2004.  - History of  Pneumocystis carinii pneumonia in October 2003.     History reviewed. No pertinent family history.  Social History:  reports that he has never smoked. He has never used smokeless tobacco. He reports that he drinks about 2 ounces of alcohol per week. He reports that he uses illicit drugs (Marijuana).   Review of Systems  Constitutional: Negative for fever, chills, weight loss, malaise/fatigue and  diaphoresis.  HENT: Negative for hearing loss, congestion, sore throat and tinnitus.   Eyes: Negative for blurred vision, double vision and pain.  Respiratory: Negative for cough, sputum production, shortness of breath, wheezing and stridor.   Cardiovascular: Negative for chest pain, palpitations, orthopnea and leg swelling.  Gastrointestinal: Negative for heartburn, nausea, vomiting, abdominal pain, diarrhea and constipation.  Genitourinary: Negative for dysuria, urgency and frequency.  Musculoskeletal: Negative for myalgias, back pain, joint pain and falls.  Skin: Negative for itching and rash.  Neurological: Positive for dizziness, tingling, sensory change, speech change, focal weakness and headaches. Negative for tremors, seizures, loss of consciousness and weakness.  Endo/Heme/Allergies: Negative.   Psychiatric/Behavioral: Negative.    Blood pressure 142/85, pulse 93, temperature 97.8 F (36.6 C), temperature source Oral, resp. rate 20, height 5\' 11"  (1.803 m), weight 87.091 kg (192 lb), SpO2 99.00%. Physical Exam  Constitutional: He is oriented to person, place, and time. He appears well-developed. No distress.  HENT:  Head: Normocephalic and atraumatic.  Right Ear: External ear normal.  Left Ear: External ear normal.  Nose: Nose normal.  Mouth/Throat: Oropharynx is clear and moist. No oropharyngeal exudate.  Eyes: Conjunctivae and EOM are normal. Pupils are equal, round, and reactive to light. Right eye exhibits no discharge. Left eye exhibits no discharge. No scleral icterus.  Neck: Normal range of motion. Neck supple. No JVD present.  Cardiovascular: Normal rate, regular rhythm and normal heart sounds.  Exam reveals no gallop and no friction rub.   No murmur heard. Respiratory: Effort normal and breath sounds normal. No respiratory distress. He has no wheezes. He has no rales. He exhibits no tenderness.  GI: Soft. Bowel sounds are normal. He exhibits no distension and no mass.  There is no tenderness. There is no guarding.  Musculoskeletal: Normal range of motion. He exhibits no edema and no tenderness.  Lymphadenopathy:    He has no cervical adenopathy.  Neurological: He is alert and oriented to person, place, and time. He displays normal reflexes. No cranial nerve deficit or sensory deficit. He exhibits normal muscle tone. Gait abnormal. Coordination normal.  Skin: Skin is warm and dry. No rash noted. He is not diaphoretic. No erythema. No pallor.  Psychiatric: He has a normal mood and affect.     Labs: CBC    Component Value Date/Time   WBC 8.9 11/30/2010 1618   RBC 4.58 11/30/2010 1618   HGB 16.0 11/30/2010 1618   HCT 43.9 11/30/2010 1618   PLT 240 11/30/2010 1618   MCV 95.9 11/30/2010 1618   MCH 34.9* 11/30/2010 1618   MCHC 36.4* 11/30/2010 1618   RDW 11.7 11/30/2010 1618   LYMPHSABS 2.3 11/30/2010 1618   MONOABS 0.7 11/30/2010 1618   EOSABS 0.2 11/30/2010 1618   BASOSABS 0.0 11/30/2010 1618    CMP     Component Value Date/Time   NA 139 11/30/2010 1618   K 4.0 11/30/2010 1618   CL 105 11/30/2010 1618   CO2 26 11/30/2010 1618   GLUCOSE 97 11/30/2010 1618   BUN 11 11/30/2010 1618   CREATININE 1.41* 11/30/2010 1618  CREATININE 1.39* 08/16/2010 1513   CALCIUM 8.9 11/30/2010 1618   PROT 7.4 11/30/2010 1618   ALBUMIN 3.8 11/30/2010 1618   AST 60* 11/30/2010 1618   ALT 77* 11/30/2010 1618   ALKPHOS 78 11/30/2010 1618   BILITOT 0.6 11/30/2010 1618   GFRNONAA 57* 11/30/2010 1618   GFRAA 66* 11/30/2010 1618   RPR Nonreactive  IMAGING: Mr Brain W Wo Contrast  12/01/2010  *RADIOLOGY REPORT*  Clinical Data:  Left hand weakness, slurred speech and gait instability.  MRI HEAD WITH AND WITHOUT CONTRAST MRA HEAD WITHOUT CONTRAST MRA NECK WITHOUT AND WITH CONTRAST  Technique: Multiplanar, multiecho pulse sequences of the brain and surrounding structures were obtained according to standard protocol with and without intravenous contrast.   Angiographic images of the Circle of Willis were obtained using MRA technique without intravenous contrast.  Angiographic images of the neck were obtained using MRA technique without and with intravenous contrast.  Contrast:19 ml MultiHance.  Comparison:11/30/2010 and 05/25/2006 CT.  Report from MR brain performed 10/08/2001 and 06/03/2001.  MRI HEAD  Findings: Within the mid superior aspect of the right temporal lobe bordering the posterior inferior margin of the sylvian fissure is an 8 mm area of altered signal intensity which has characteristics of a cavernous angioma.  This appears slightly larger than the remote 2008 CT scan and may have bleed internally since 2008 contributing to slight increase in size.  On the diffusion sequence immediately anterior to this cavernoma, there is linear increased signal within the posterior right sub insular region.  This may represent reactive changes from the cavernoma although small acute infarct not entirely excluded.  No other findings of acute infarct.  Posterior sella 10.8 x 13.9 x 8.2 mm cystic-appearing structure scalloping posterior border of the anterior pituitary gland, slightly greater extension to the left bordering the left cavernous sinus.  This is a nonspecific finding and may represent a small Rathke cleft cyst.  Comment was made upon this on the 2003 MR.  I cannot determine if this has changed as images are not available for direct review.  Cerebellar tonsils minimally low-lying but within range normal limits.  No hydrocephalus.  Minimal partial opacification mastoid air cells and minimal mucosal thickening paranasal sinuses.  IMPRESSION: Right temporal lobe cavernoma.  The subtle altered signal intensity within the posterior right sub insular region on diffusion sequence may represent reactive changes to the cavernoma versus small acute infarct.  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 Stability can be confirmed on follow-up.  MRA HEAD   Findings:No abnormal vessels in the region of the right temporal lobe cavernoma.  Ectatic distal vertical cervical segment of the left internal carotid artery.  Anterior circulation without medium or large size vessel significant stenosis or occlusion.  Ectatic vertebral arteries and basilar artery without high-grade stenosis.  Branch vessel irregularity.  Slightly prominent appearance of the basilar tip.  No aneurysm noted.  IMPRESSION: No abnormal vessels extend into the region of the right temporal lobe cavernoma.  No medium or large sized vessel significant stenosis or occlusion.  Branch vessel irregularity.  Please see above.  MRA NECK  Findings:Normal configuration of the origin of the great vessels from the aortic arch.  Mild irregularity proximal left common carotid artery.  No evidence of hemodynamically significant stenosis or significant irregularity involving the carotid bifurcation on either side. Ectatic vertical cervical segment of the internal carotid arteries more notable on the left.  No evidence of significant stenosis of either vertebral artery.  IMPRESSION: No evidence of vertebral artery or carotid bifurcations significant narrowing or irregularity.   Assessment/Plan: 50 yo M with HIV, with CD4 count 700, undetectable, well-controlled on complera presents with new onset left hand weakness and slurred speech which has now resolved and MRI findings showing possible new blood (since bright T2 signalling) within right temporal lobe cavernoma. Now patient complains of headache and vertigo.  Possible bleed within right temporal lobe cavernoma -- would ask neuroradiologist if they could tell by the MR signaling the age of bleed to have a better sense if this corresponds to the patient's symptoms. Until then, I would hold off on giving any aspirin as this might place patient at further risk for ICH. Other possibility of his presentation, as mentioned by neurology, is that this could be  presentation of temporal seizures. Await reading on EEG.   Vertigo - patient reports having history of vertigo. Did not see any nystagmus on physical exam. If patient is still vertiginous in the am, consider doing dicks hallpike maneuver to see if it induces symptoms.. And subsequently, eply's maneuver to help treat/move otoliths from triggering haircells. His vertigo may also be part of an ictal phenomenon, as well. Will follow up in the morning  Regarding HIV, please continue on complera, 1 tablet daily for his well controlled HIV.  We will arrange for follow up appt in ID clinic upon his discharge.    Judyann Munson 12/01/2010, 9:33 PM  Infectious Diseases [phone]

## 2010-12-01 NOTE — Progress Notes (Signed)
Daily Progress Note Max Ray. Max Ray, M.D., M.B.A  Family Medicine PGY-1   Subjective: Patient has improved symptoms of left hand weakness and numbness, denies numbness of lips and mouth, no tingling sensation on left side of face that was present yesterday day. Has not been out of bed. Believes that speech has returned to normal.    Objective: Vital signs in last 24 hours: Temp:  [97.6 F (36.4 C)-98.6 F (37 C)] 97.6 F (36.4 C) (11/28 0700) Pulse Rate:  [63-111] 63  (11/28 0700) Resp:  [16-18] 16  (11/28 0700) BP: (128-154)/(82-105) 154/82 mmHg (11/28 0700) SpO2:  [94 %-98 %] 96 % (11/28 0700) Weight:  [192 lb (87.091 kg)-197 lb (89.359 kg)] 192 lb (87.091 kg) (11/27 2300) Weight change:  Last BM Date: 11/30/10  Intake/Output from previous day:   Intake/Output this shift: Total I/O In: 240 [P.O.:240] Out: 550 [Urine:550]  General appearance: alert, cooperative and no distress Resp: clear to auscultation bilaterally Cardio: regular rate and rhythm, S1, S2 normal, no murmur, click, rub or gallop GI: soft, non-tender; bowel sounds normal; no masses,  no organomegaly Neurologic: Cranial nerves: normal Sensory: normal Motor: grossly normal - did not ambulate patient this AM    Lab Results:    Studies/Results: MRI Head: IMPRESSION: Right temporal lobe cavernoma.  The subtle altered signal intensity within the posterior right sub insular region on diffusion sequence may represent reactive changes to the cavernoma versus small acute infarct.  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 Stability can be confirmed on follow-up.  MRA Head: IMPRESSION: No abnormal vessels extend into the region of the right temporal lobe cavernoma.  No medium or large sized vessel significant stenosis or occlusion.  Branch vessel irregularity.  Please see above.  MRA Neck:  IMPRESSION: No evidence of vertebral artery or carotid bifurcations significant narrowing or  irregularity.  Medications:  I have reviewed the patient's current medications. Scheduled:   . aspirin EC  81 mg Oral Daily  . Emtricitab-Rilpivir-Tenofovir  1 tablet Oral Q supper  . heparin  5,000 Units Subcutaneous Q8H  . loratadine  10 mg Oral Daily  . sodium chloride  3 mL Intravenous Q12H  . DISCONTD: Emtricitab-Rilpivir-Tenofovir  1 tablet Oral Daily   XBJ:YNWGNF chloride, gadobenate dimeglumine, ondansetron (ZOFRAN) IV, senna-docusate, sodium chloride  Assessment/Plan: This is a 50 YO M with a history of HIV (diagnosed 15 years ago) presenting with left hand weakness, slurred speech, unsteady gait that started acutely last night and has positive finding on brain MRI.    Left hand weakness, slurred speech, unsteady gait  - improved symptoms, likely 2/2 cavernoma of right temporal lobe seen on MRI - f/u recs of neurology this AM   Acute kidney injury Cr 1.19 in 04/2007.  -Will give fluids and re-check in the AM 11/29  Chest pain 2 days ago. None currently. First set of cardiac enzymes negative.  -Will check ECG   HIV Recently had labs checked 11/05/2010. HIV 1 RNA Quant < 20, CD4 count 700 with 33% Helper T cell. Patient seems to have good compliance with follow-up.  -Will consult ID in the AM.   Nasal congestion Viral URI versus allergic rhinitis  -Will start Claritin   FEN/GI NS @ 125. Regular diet after passing bedside swallow  PPx Heparin SQ for DVT PPx  Dispo Pending TIA/stroke work-up   LOS: 1 day   [name] 12/01/2010, 8:30 AM

## 2010-12-01 NOTE — Progress Notes (Signed)
Utilization review complete 

## 2010-12-01 NOTE — Procedures (Addendum)
EEG NUMBER:  HISTORY:  A 50 year old male with episode of left-sided numbness, weakness, and slurred speech.  MEDICATIONS:  Aspirin, loratadine, sodium chloride, gadobenate dimeglumine, Zofran, senna.  CONDITIONS OF RECORDING:  This is a 16 channel EEG carried out with the patient in the asleep state.  DESCRIPTION:  Throughout the majority of the tracing the patient was in stage 2 sleep.  The background activity was slow and consists mostly of delta rhythms that were poorly organized.  That was superimposed symmetrical sleep spindles and vertex central sharp transients.  No epileptiform activity was noted.  The patient did have arousals during the tracing.  With the arousals, the background activity did improve to the point that more theta activity was able to be seen.  This was not Sustained due to the patient quickly returning to stage II sleep.  Hypoventilation was not performed.  Intermittent photic stimulation failed to elicit any change in the tracing.  IMPRESSION:  This is a normal asleep EEG.          ______________________________ Thana Farr, MD    [id number] D:  12/01/2010 18:16:42  T:  12/01/2010 23:17:48  Job #:  [id number]

## 2010-12-01 NOTE — Progress Notes (Signed)
  Echocardiogram 2D Echocardiogram has been performed.  Cathie Beams Deneen 12/01/2010, 10:05 AM

## 2010-12-01 NOTE — Progress Notes (Signed)
PT order received. Pt out of room for test this morning during multiple attempts to see pt. Will continue to follow.  Ralene Bathe Kistler PT 12/01/2010  [phone]

## 2010-12-01 NOTE — Progress Notes (Signed)
Physical Therapy Evaluation Patient Details Name: Max Ray MRN: [id number] DOB: 31-Jan-1960 Today's Date: 12/01/2010  Eval 2 11:55-12:20  Problem List:  Patient Active Problem List  Diagnoses  . HIV DISEASE  . GENITAL HERPES  . SYPHILIS  . TINEA CORPORIS  . GLUCOSE-6-PHOSPHATE DEHYDROGENASE DEFICIENCY  . THROMBOCYTOPENIA  . ERECTILE DYSFUNCTION  . PRESBYOPIA  . ACUTE SINUSITIS, UNSPECIFIED  . SORE THROAT  . ALLERGIC RHINITIS, SEASONAL  . CELLULITIS AND ABSCESS OF FACE  . BACK PAIN, LUMBAR  . FATIGUE  . SKIN RASH  . TACHYCARDIA  . FREQUENCY, URINARY  . NOCTURIA  . OTHER ABNORMALITY OF URINATION  . ADVERSE EFFECTS, SIRS, UNSPECIFIED    Past Medical History:  Past Medical History  Diagnosis Date  . HIV disease   . Syphilis    Past Surgical History: History reviewed. No pertinent past surgical history.  PT Assessment/Plan/Recommendation PT Assessment Clinical Impression Statement: Pt pleasant and motivated. Strength/ROM WNL. Gait limited by reported dizziness. BP WNL, HR up to 110 with walking. PT Recommendation/Assessment: Patient will need skilled PT in the acute care venue PT Problem List: Decreased balance;Decreased activity tolerance Barriers to Discharge: None PT Therapy Diagnosis : Difficulty walking;Other (comment) (dizziness with walking) PT Plan PT Frequency: Min 3X/week PT Treatment/Interventions: Balance training;Functional mobility training;Therapeutic exercise;Patient/family education PT Recommendation Follow Up Recommendations: Outpatient PT Equipment Recommended: None recommended by PT PT Goals  Acute Rehab PT Goals PT Goal Formulation: With patient Time For Goal Achievement: 7 days Pt will Ambulate: >150 feet;Independently (without LOB) PT Goal: Ambulate - Progress: Progressing toward goal Pt will Go Up / Down Stairs: Flight;with modified independence;with rail(s)  PT Evaluation Precautions/Restrictions  Precautions Precautions: Fall  (due to dizziness) Restrictions Weight Bearing Restrictions: No Prior Functioning  Home Living Lives With: Alone Receives Help From: Family Type of Home: Apartment Home Layout: One level;Other (Comment) (2nd floor condo) Home Access: Stairs to enter Entrance Stairs-Rails: Right Entrance Stairs-Number of Steps: 14 Prior Function Level of Independence: Independent with basic ADLs;Independent with homemaking with ambulation;Independent with gait Able to Take Stairs?: Yes Vocation: Other (comment) Vocation Requirements: Chartered loss adjuster Cognition Cognition Arousal/Alertness: Awake/alert Overall Cognitive Status: Appears within functional limits for tasks assessed Orientation Level: Oriented X4 Sensation/Coordination Sensation Light Touch: Appears Intact Coordination Gross Motor Movements are Fluid and Coordinated: Yes Fine Motor Movements are Fluid and Coordinated: Yes Extremity Assessment RUE Assessment RUE Assessment: Within Functional Limits LUE Assessment LUE Assessment: Within Functional Limits RLE Assessment RLE Assessment: Within Functional Limits LLE Assessment LLE Assessment: Within Functional Limits Mobility (including Balance) Bed Mobility Bed Mobility: Yes Supine to Sit: 6: Modified independent (Device/Increase time);HOB elevated (Comment degrees);With rails (HOB up 30 degrees) Transfers Transfers: Yes Sit to Stand: 7: Independent;From bed Stand to Sit: 7: Independent;To bed Ambulation/Gait Ambulation/Gait: Yes Ambulation/Gait Assistance: 5: Supervision Ambulation/Gait Assistance Details (indicate cue type and reason): Supervision due to reported dizziness Ambulation Distance (Feet): 200 Feet Assistive device: None Gait Pattern: Step-through pattern;Scissoring (occaisional mild scissoring, pt could self correct) Gait velocity: WNL Stairs: No Wheelchair Mobility Wheelchair Mobility: No  Posture/Postural Control Posture/Postural Control: No significant  limitations Balance Balance Assessed: Yes Static Sitting Balance Static Sitting - Balance Support: Feet supported Static Sitting - Level of Assistance: 7: Independent Static Sitting - Comment/# of Minutes: 6 Dynamic Standing Balance Dynamic Standing - Balance Support: No upper extremity supported Dynamic Standing - Level of Assistance: 5: Stand by assistance Dynamic Standing - Balance Activities: Other (comment) (walking) Dynamic Standing - Comments: mild LOB/scissoring with walking, pt c/o dizziness. BP WNL, HR  110 Exercise    End of Session PT - End of Session Equipment Utilized During Treatment: Gait belt Activity Tolerance: Treatment limited secondary to medical complications (Comment) (dizziness) Patient left: in bed;with call bell in reach Nurse Communication: Mobility status for transfers;Mobility status for ambulation General Behavior During Session: [facility] for tasks performed Cognition: [facility] for tasks performed  Tamala Ser PT 12/01/2010  [phone]  Tamala Ser 12/01/2010, 12:35 PM

## 2010-12-01 NOTE — Consult Note (Signed)
Reason for Consult:cavernoma   CC: cavernoma  HPI: Max Ray is an 50 y.o. male  has a past medical history of HIV (diagnosed 15 years ago) disease, Syphilis and reports a history of pituitary tumor "which resolved spontaneously".  On Monday he noted around 10 pm he had clumsiness of left hand.  HE awoke in the morning on Tuesday and noted Left arm weakness , "decreased motor skills" .  Later in the day he noted slurred speech.  He presented to the ED with left hand weakness. While in the ED he states his symptoms started to improve.  Head CT showed-- No CT findings for acute intracranial process. 2.  Small hyperdense lesion likely in or near the right choroidal fissure.  This is slightly larger and more pronounced than it was on the prior head CT.  MRI showed --Right temporal lobe cavernoma.  The subtle altered signal intensity within the posterior right sub insular region on diffusion sequence may represent reactive changes to the cavernoma versus small acute infarct.  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   At present time patient states his LEFT arm has increased sensation compared to his right but all other symptoms have resolved.  Past Medical History  Diagnosis Date  . HIV disease   . Syphilis     History reviewed. No pertinent past surgical history.  History reviewed. No pertinent family history.  Social History:  reports that he has never smoked. He has never used smokeless tobacco. He reports that he drinks about 2 ounces of alcohol per week. He reports that he uses illicit drugs (Marijuana).  Allergies  Allergen Reactions  . Sulfamethoxazole W/Trimethoprim Other (See Comments)    Unknown allergic reaction  . Sulfonamide Derivatives     Medications:   Prior to Admission medications   Medication Sig Start Date End Date Taking? Authorizing Provider  Emtricitab-Rilpivir-Tenofovir (COMPLERA) 200-25-300 MG TABS Take 1 tablet by mouth daily. With a  400-600-calorie meal. 08/30/10  Yes Carolin Guernsey, NP     Scheduled:   . Emtricitab-Rilpivir-Tenofovir  1 tablet Oral Q supper  . heparin  5,000 Units Subcutaneous Q8H  . loratadine  10 mg Oral Daily  . sodium chloride  3 mL Intravenous Q12H  . DISCONTD: Emtricitab-Rilpivir-Tenofovir  1 tablet Oral Daily    Review of Systems - General ROS: negative for - chills, fatigue, fever or hot flashes Hematological and Lymphatic ROS: negative for - bruising, fatigue, jaundice or pallor Endocrine ROS: negative for - hair pattern changes, hot flashes, mood swings or skin changes Respiratory ROS: negative for - cough, hemoptysis, orthopnea or wheezing Cardiovascular ROS: dizziness Gastrointestinal ROS: negative for - abdominal pain, appetite loss, blood in stools, diarrhea or hematemesis Musculoskeletal ROS: negative for - joint pain, joint stiffness, joint swelling or muscle pain Neurological ROS: positive for - weakness Dermatological ROS: negative for dry skin, pruritus and rash   Blood pressure 128/80, pulse 69, temperature 97.8 F (36.6 C), temperature source Oral, resp. rate 16, height 5\' 11"  (1.803 m), weight 87.091 kg (192 lb), SpO2 98.00%.  Neurologic Examination: Mental Status: Alert, oriented, thought content appropriate.  Speech fluent without evidence of aphasia. Able to follow 3 step commands without difficulty. Cranial Nerves: II- Visual fields grossly intact. III/IV/VI-Extraocular movements intact.  Pupils reactive bilaterally. V/VII-Smile symmetric VIII-grossly intact IX/X-normal gag XI-bilateral shoulder shrug XII-midline tongue extension Motor: 5/5 bilaterally with normal tone and bulk Sensory: Pinprick and light touch intact throughout, bilaterally states his Left arm has increased  sensation compared to right.  Deep Tendon Reflexes: 2+ and symmetric throughout Plantars: Downgoing bilaterally Cerebellar: Normal finger-to-nose, normal rapid alternating movements  and normal heel-to-shin test.  Normal gait and station.     Results for orders placed during the hospital encounter of 11/30/10 (from the past 48 hour(s))  CARDIAC PANEL(CRET KIN+CKTOT+MB+TROPI)     Status: Abnormal   Collection Time   11/30/10  4:15 PM      Component Value Range Comment   Total CK 244 (*) 7 - 232 (U/L)    CK, MB 2.2  0.3 - 4.0 (ng/mL)    Troponin I <0.30  <0.30 (ng/mL)    Relative Index 0.9  0.0 - 2.5    CBC     Status: Abnormal   Collection Time   11/30/10  4:18 PM      Component Value Range Comment   WBC 8.9  4.0 - 10.5 (K/uL)    RBC 4.58  4.22 - 5.81 (MIL/uL)    Hemoglobin 16.0  13.0 - 17.0 (g/dL)    HCT 46.9  62.9 - 52.8 (%)    MCV 95.9  78.0 - 100.0 (fL)    MCH 34.9 (*) 26.0 - 34.0 (pg)    MCHC 36.4 (*) 30.0 - 36.0 (g/dL)    RDW 41.3  24.4 - 01.0 (%)    Platelets 240  150 - 400 (K/uL)   DIFFERENTIAL     Status: Normal   Collection Time   11/30/10  4:18 PM      Component Value Range Comment   Neutrophils Relative 65  43 - 77 (%)    Neutro Abs 5.8  1.7 - 7.7 (K/uL)    Lymphocytes Relative 26  12 - 46 (%)    Lymphs Abs 2.3  0.7 - 4.0 (K/uL)    Monocytes Relative 7  3 - 12 (%)    Monocytes Absolute 0.7  0.1 - 1.0 (K/uL)    Eosinophils Relative 2  0 - 5 (%)    Eosinophils Absolute 0.2  0.0 - 0.7 (K/uL)    Basophils Relative 0  0 - 1 (%)    Basophils Absolute 0.0  0.0 - 0.1 (K/uL)   COMPREHENSIVE METABOLIC PANEL     Status: Abnormal   Collection Time   11/30/10  4:18 PM      Component Value Range Comment   Sodium 139  135 - 145 (mEq/L)    Potassium 4.0  3.5 - 5.1 (mEq/L)    Chloride 105  96 - 112 (mEq/L)    CO2 26  19 - 32 (mEq/L)    Glucose, Bld 97  70 - 99 (mg/dL)    BUN 11  6 - 23 (mg/dL)    Creatinine, Ser 2.72 (*) 0.50 - 1.35 (mg/dL)    Calcium 8.9  8.4 - 10.5 (mg/dL)    Total Protein 7.4  6.0 - 8.3 (g/dL)    Albumin 3.8  3.5 - 5.2 (g/dL)    AST 60 (*) 0 - 37 (U/L) SLIGHT HEMOLYSIS   ALT 77 (*) 0 - 53 (U/L)    Alkaline Phosphatase 78  39 -  117 (U/L)    Total Bilirubin 0.6  0.3 - 1.2 (mg/dL)    GFR calc non Af Amer 57 (*) >90 (mL/min)    GFR calc Af Amer 66 (*) >90 (mL/min)   URINALYSIS, ROUTINE W REFLEX MICROSCOPIC     Status: Abnormal   Collection Time   11/30/10  4:30 PM  Component Value Range Comment   Color, Urine YELLOW  YELLOW     Appearance CLEAR  CLEAR     Specific Gravity, Urine 1.007  1.005 - 1.030     pH 7.5  5.0 - 8.0     Glucose, UA NEGATIVE  NEGATIVE (mg/dL)    Hgb urine dipstick NEGATIVE  NEGATIVE     Bilirubin Urine NEGATIVE  NEGATIVE     Ketones, ur NEGATIVE  NEGATIVE (mg/dL)    Protein, ur NEGATIVE  NEGATIVE (mg/dL)    Urobilinogen, UA 1.0  0.0 - 1.0 (mg/dL)    Nitrite NEGATIVE  NEGATIVE     Leukocytes, UA TRACE (*) NEGATIVE    URINE MICROSCOPIC-ADD ON     Status: Normal   Collection Time   11/30/10  4:30 PM      Component Value Range Comment   Squamous Epithelial / LPF RARE  RARE     WBC, UA 0-2  <3 (WBC/hpf)    Bacteria, UA RARE  RARE    RPR     Status: Normal   Collection Time   11/30/10 10:14 PM      Component Value Range Comment   RPR NON REACTIVE  NON REACTIVE    LIPID PANEL     Status: Normal   Collection Time   12/01/10  6:00 AM      Component Value Range Comment   Cholesterol 172  0 - 200 (mg/dL)    Triglycerides 161  <150 (mg/dL)    HDL 53  >09 (mg/dL)    Total CHOL/HDL Ratio 3.2      VLDL 28  0 - 40 (mg/dL)    LDL Cholesterol 91  0 - 99 (mg/dL)     Dg Chest 2 View  60/45/4098  *RADIOLOGY REPORT*  Clinical Data: Shortness of breath.  Weakness.  Left arm numbness. Slurred speech.  CHEST - 2 VIEW  Comparison: 10/29/2007.  Findings: Normal sized heart.  Clear lungs.  Mild central peribronchial thickening with mild progression.  Minimal thoracic spine degenerative changes.  IMPRESSION: Mild bronchitic changes.  Original Report Authenticated By: Darrol Angel, M.D.   Ct Head Wo Contrast  11/30/2010  *RADIOLOGY REPORT*  Clinical Data: Slurred speech and left arm weakness.  CT  HEAD WITHOUT CONTRAST  Technique:  Contiguous axial images were obtained from the base of the skull through the vertex without contrast.  Comparison: Head CT 05/25/2006.  Findings: The ventricles are normal and stable.  No extra-axial fluid collections are identified.  No CT findings for acute hemispheric infarction or intracranial hemorrhage.  There is a small hyperdensity noted on the right side near the posterior aspect of the right sylvian fissure.  This could be arachnoid granulations in the choroid fissure.  It does appear slightly more prominent when compared the prior head CT.  It is likely a benign finding.  I would however recommend an MRI without and with contrast for further evaluation (non urgent).  The brainstem and cerebellum appear normal.  The bony structures are intact.  The paranasal sinuses and mastoid air cells are clear.  IMPRESSION:  1.  No CT findings for acute intracranial process. 2.  Small hyperdense lesion likely in or near the right choroidal fissure.  This is slightly larger and more pronounced than it was on the prior head CT.  Recommend non urgent follow-up MRI without and with contrast.  Original Report Authenticated By: P. Loralie Champagne, M.D.   Mr Angiogram Head Wo Contrast  12/01/2010  *  RADIOLOGY REPORT*  Clinical Data:  Left hand weakness, slurred speech and gait instability.  MRI HEAD WITH AND WITHOUT CONTRAST MRA HEAD WITHOUT CONTRAST MRA NECK WITHOUT AND WITH CONTRAST  Technique: Multiplanar, multiecho pulse sequences of the brain and surrounding structures were obtained according to standard protocol with and without intravenous contrast.  Angiographic images of the Circle of Willis were obtained using MRA technique without intravenous contrast.  Angiographic images of the neck were obtained using MRA technique without and with intravenous contrast.  Contrast:19 ml MultiHance.  Comparison:11/30/2010 and 05/25/2006 CT.  Report from MR brain performed 10/08/2001 and  06/03/2001.  MRI HEAD  Findings: Within the mid superior aspect of the right temporal lobe bordering the posterior inferior margin of the sylvian fissure is an 8 mm area of altered signal intensity which has characteristics of a cavernous angioma.  This appears slightly larger than the remote 2008 CT scan and may have bleed internally since 2008 contributing to slight increase in size.  On the diffusion sequence immediately anterior to this cavernoma, there is linear increased signal within the posterior right sub insular region.  This may represent reactive changes from the cavernoma although small acute infarct not entirely excluded.  No other findings of acute infarct.  Posterior sella 10.8 x 13.9 x 8.2 mm cystic-appearing structure scalloping posterior border of the anterior pituitary gland, slightly greater extension to the left bordering the left cavernous sinus.  This is a nonspecific finding and may represent a small Rathke cleft cyst.  Comment was made upon this on the 2003 MR.  I cannot determine if this has changed as images are not available for direct review.  Cerebellar tonsils minimally low-lying but within range normal limits.  No hydrocephalus.  Minimal partial opacification mastoid air cells and minimal mucosal thickening paranasal sinuses.  IMPRESSION: Right temporal lobe cavernoma.  The subtle altered signal intensity within the posterior right sub insular region on diffusion sequence may represent reactive changes to the cavernoma versus small acute infarct.  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 Stability can be confirmed on follow-up.  MRA HEAD  Findings:No abnormal vessels in the region of the right temporal lobe cavernoma.  Ectatic distal vertical cervical segment of the left internal carotid artery.  Anterior circulation without medium or large size vessel significant stenosis or occlusion.  Ectatic vertebral arteries and basilar artery without high-grade stenosis.   Branch vessel irregularity.  Slightly prominent appearance of the basilar tip.  No aneurysm noted.  IMPRESSION: No abnormal vessels extend into the region of the right temporal lobe cavernoma.  No medium or large sized vessel significant stenosis or occlusion.  Branch vessel irregularity.  Please see above.  MRA NECK  Findings:Normal configuration of the origin of the great vessels from the aortic arch.  Mild irregularity proximal left common carotid artery.  No evidence of hemodynamically significant stenosis or significant irregularity involving the carotid bifurcation on either side. Ectatic vertical cervical segment of the internal carotid arteries more notable on the left.  No evidence of significant stenosis of either vertebral artery.  IMPRESSION: No evidence of vertebral artery or carotid bifurcations significant narrowing or irregularity.  Preliminary interpretation by Dr. Cherly Hensen discussed with Dr.  Judd Lien 11/30/2010 at 10:23 p.m.  Original Report Authenticated By: Fuller Canada, M.D.   Mr Angiogram Neck W Wo Contrast  12/01/2010  *RADIOLOGY REPORT*  Clinical Data:  Left hand weakness, slurred speech and gait instability.  MRI HEAD WITH AND WITHOUT CONTRAST MRA HEAD WITHOUT  CONTRAST MRA NECK WITHOUT AND WITH CONTRAST  Technique: Multiplanar, multiecho pulse sequences of the brain and surrounding structures were obtained according to standard protocol with and without intravenous contrast.  Angiographic images of the Circle of Willis were obtained using MRA technique without intravenous contrast.  Angiographic images of the neck were obtained using MRA technique without and with intravenous contrast.  Contrast:19 ml MultiHance.  Comparison:11/30/2010 and 05/25/2006 CT.  Report from MR brain performed 10/08/2001 and 06/03/2001.  MRI HEAD  Findings: Within the mid superior aspect of the right temporal lobe bordering the posterior inferior margin of the sylvian fissure is an 8 mm area of altered signal  intensity which has characteristics of a cavernous angioma.  This appears slightly larger than the remote 2008 CT scan and may have bleed internally since 2008 contributing to slight increase in size.  On the diffusion sequence immediately anterior to this cavernoma, there is linear increased signal within the posterior right sub insular region.  This may represent reactive changes from the cavernoma although small acute infarct not entirely excluded.  No other findings of acute infarct.  Posterior sella 10.8 x 13.9 x 8.2 mm cystic-appearing structure scalloping posterior border of the anterior pituitary gland, slightly greater extension to the left bordering the left cavernous sinus.  This is a nonspecific finding and may represent a small Rathke cleft cyst.  Comment was made upon this on the 2003 MR.  I cannot determine if this has changed as images are not available for direct review.  Cerebellar tonsils minimally low-lying but within range normal limits.  No hydrocephalus.  Minimal partial opacification mastoid air cells and minimal mucosal thickening paranasal sinuses.  IMPRESSION: Right temporal lobe cavernoma.  The subtle altered signal intensity within the posterior right sub insular region on diffusion sequence may represent reactive changes to the cavernoma versus small acute infarct.  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 Stability can be confirmed on follow-up.  MRA HEAD  Findings:No abnormal vessels in the region of the right temporal lobe cavernoma.  Ectatic distal vertical cervical segment of the left internal carotid artery.  Anterior circulation without medium or large size vessel significant stenosis or occlusion.  Ectatic vertebral arteries and basilar artery without high-grade stenosis.  Branch vessel irregularity.  Slightly prominent appearance of the basilar tip.  No aneurysm noted.  IMPRESSION: No abnormal vessels extend into the region of the right temporal lobe  cavernoma.  No medium or large sized vessel significant stenosis or occlusion.  Branch vessel irregularity.  Please see above.  MRA NECK  Findings:Normal configuration of the origin of the great vessels from the aortic arch.  Mild irregularity proximal left common carotid artery.  No evidence of hemodynamically significant stenosis or significant irregularity involving the carotid bifurcation on either side. Ectatic vertical cervical segment of the internal carotid arteries more notable on the left.  No evidence of significant stenosis of either vertebral artery.  IMPRESSION: No evidence of vertebral artery or carotid bifurcations significant narrowing or irregularity.  Preliminary interpretation by Dr. Cherly Hensen discussed with Dr.  Judd Lien 11/30/2010 at 10:23 p.m.  Original Report Authenticated By: Fuller Canada, M.D.   Mr Laqueta Jean Wo Contrast  12/01/2010  *RADIOLOGY REPORT*  Clinical Data:  Left hand weakness, slurred speech and gait instability.  MRI HEAD WITH AND WITHOUT CONTRAST MRA HEAD WITHOUT CONTRAST MRA NECK WITHOUT AND WITH CONTRAST  Technique: Multiplanar, multiecho pulse sequences of the brain and surrounding structures were obtained according to standard protocol  with and without intravenous contrast.  Angiographic images of the Circle of Willis were obtained using MRA technique without intravenous contrast.  Angiographic images of the neck were obtained using MRA technique without and with intravenous contrast.  Contrast:19 ml MultiHance.  Comparison:11/30/2010 and 05/25/2006 CT.  Report from MR brain performed 10/08/2001 and 06/03/2001.    MRI HEAD  Findings: Within the mid superior aspect of the right temporal lobe bordering the posterior inferior margin of the sylvian fissure is an 8 mm area of altered signal intensity which has characteristics of a cavernous angioma.  This appears slightly larger than the remote 2008 CT scan and may have bleed internally since 2008 contributing to slight increase  in size.  On the diffusion sequence immediately anterior to this cavernoma, there is linear increased signal within the posterior right sub insular region.  This may represent reactive changes from the cavernoma although small acute infarct not entirely excluded.  No other findings of acute infarct.  Posterior sella 10.8 x 13.9 x 8.2 mm cystic-appearing structure scalloping posterior border of the anterior pituitary gland, slightly greater extension to the left bordering the left cavernous sinus.  This is a nonspecific finding and may represent a small Rathke cleft cyst.  Comment was made upon this on the 2003 MR.  I cannot determine if this has changed as images are not available for direct review.  Cerebellar tonsils minimally low-lying but within range normal limits.  No hydrocephalus.  Minimal partial opacification mastoid air cells and minimal mucosal thickening paranasal sinuses.    IMPRESSION: Right temporal lobe cavernoma.  The subtle altered signal intensity within the posterior right sub insular region on diffusion sequence may represent reactive changes to the cavernoma versus small acute infarct.  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 Stability can be confirmed on follow-up.    MRA HEAD  Findings:No abnormal vessels in the region of the right temporal lobe cavernoma.  Ectatic distal vertical cervical segment of the left internal carotid artery.  Anterior circulation without medium or large size vessel significant stenosis or occlusion.  Ectatic vertebral arteries and basilar artery without high-grade stenosis.  Branch vessel irregularity.  Slightly prominent appearance of the basilar tip.  No aneurysm noted.    IMPRESSION: No abnormal vessels extend into the region of the right temporal lobe cavernoma.  No medium or large sized vessel significant stenosis or occlusion.  Branch vessel irregularity.  Please see above.    MRA NECK  Findings:Normal configuration of the origin of  the great vessels from the aortic arch.  Mild irregularity proximal left common carotid artery.  No evidence of hemodynamically significant stenosis or significant irregularity involving the carotid bifurcation on either side. Ectatic vertical cervical segment of the internal carotid arteries more notable on the left.  No evidence of significant stenosis of either vertebral artery.    IMPRESSION: No evidence of vertebral artery or carotid bifurcations significant narrowing or irregularity.  Preliminary interpretation by Dr. Cherly Hensen discussed with Dr.  Judd Lien 11/30/2010 at 10:23 p.m.  Original Report Authenticated By: Fuller Canada, M.D.     Assessment: 50 yo male presenting with left sided symptoms and dizziness.  Work up has revealed a small hyperintensity that was seen on past images.  T2 hyperintensity has unclear significance at this time.  Although infarct is on the differential must also consider the possibility that his presenting symptoms were a seizure and this finding is secondary to this. Infection is less likely. Patient also complains of  dizziness that is worse when he is up as opposed to lying down.  Had a near fall today.  Although this can be seen as a consequence of seizure, patient did present with markedly elevated BP that has been normalized.  Will rule out orthostasis.  Plan: (1) Repeat MRI of the brain in 1-2 months as an out patient.   (2) ASA 81mg  daily.   (3) EEG (4) Decision about AED's to be made once EEG reviewed. (5) Orthostatic BP with heart rate q shift   Felicie Morn PA-C [facility] [phone]  12/01/2010, 10:52 AM  Patient seen and examined. I agree with the above.  Thana Farr, MD [facility] [phone]  12/01/2010  1:14 PM

## 2010-12-01 NOTE — Progress Notes (Signed)
*  PRELIMINARY RESULTS*  Bilateral carotid dopplers performed. Results showed no ICA stenosis. Antegrade vertebral artery flow.  Max Ray 12/01/2010, 12:24 PM

## 2010-12-01 NOTE — H&P (Signed)
I have seen and examined this patient. I have discussed with Dr Madolyn Frieze.  I agree with their findings and plans as documented in their admission note.  Acute Issues  1. Left Upper Extremity weakness and sensory change, acute - Brain MRI with venous anomaly in right Sylvian fissue that has gradually increased in size  since last head CT may be the etiology of this acute paresis.   Plan:  Consult Neurology Service for their opinion as to the role of this brain venous anomaly in this acute presentation and treatment options, if needed.

## 2010-12-01 NOTE — Progress Notes (Signed)
Pt reported dizziness with walking. Standing BP 137/99, HR up to 110 with walking, 77 at rest. With cervical rotation pt reported further dizziness. Pt would benefit from vestibular evaluation. Will plan for vestibular eval next PT visit.   Ralene Bathe Kistler PT 12/01/2010  [phone]

## 2010-12-02 ENCOUNTER — Inpatient Hospital Stay (HOSPITAL_COMMUNITY): Payer: Medicare Other

## 2010-12-02 LAB — BASIC METABOLIC PANEL
BUN: 9 mg/dL (ref 6–23)
CO2: 28 mEq/L (ref 19–32)
GFR calc non Af Amer: 65 mL/min — ABNORMAL LOW (ref >90)
Glucose, Bld: 90 mg/dL (ref 70–99)
Potassium: 3.4 mEq/L — ABNORMAL LOW (ref 3.5–5.1)

## 2010-12-02 MED ORDER — ACETAMINOPHEN 325 MG PO TABS: 650.0000 mg | ORAL_TABLET | Freq: Four times a day (QID) | ORAL | Status: DC

## 2010-12-02 MED ORDER — EMTRICITAB-RILPIVIR-TENOFOV DF 200-25-300 MG PO TABS
1.0000 | ORAL_TABLET | Freq: Every day | ORAL | Status: DC
  Administered 2010-12-02: 1 via ORAL
  Filled 2010-12-02 (×3): qty 1

## 2010-12-02 MED ORDER — ACETAMINOPHEN 325 MG PO TABS
650.0000 mg | ORAL_TABLET | Freq: Four times a day (QID) | ORAL | Status: DC | PRN
  Administered 2010-12-03: 650 mg via ORAL
  Filled 2010-12-02: qty 2

## 2010-12-02 MED ORDER — EMTRICITAB-RILPIVIR-TENOFOV DF 200-25-300 MG PO TABS
1.0000 | ORAL_TABLET | Freq: Every day | ORAL | Status: DC
  Filled 2010-12-02: qty 1

## 2010-12-02 NOTE — Progress Notes (Signed)
Occupational Therapy Evaluation Patient Details Name: Max Ray MRN: [id number] DOB: Jul 20, 1960 Today's Date: 12/02/2010 14:36-14:46  evII Problem List:  Patient Active Problem List  Diagnoses  . HIV DISEASE  . GENITAL HERPES  . SYPHILIS  . TINEA CORPORIS  . GLUCOSE-6-PHOSPHATE DEHYDROGENASE DEFICIENCY  . THROMBOCYTOPENIA  . ERECTILE DYSFUNCTION  . PRESBYOPIA  . ACUTE SINUSITIS, UNSPECIFIED  . SORE THROAT  . ALLERGIC RHINITIS, SEASONAL  . CELLULITIS AND ABSCESS OF FACE  . BACK PAIN, LUMBAR  . FATIGUE  . SKIN RASH  . TACHYCARDIA  . FREQUENCY, URINARY  . NOCTURIA  . OTHER ABNORMALITY OF URINATION  . ADVERSE EFFECTS, SIRS, UNSPECIFIED    Past Medical History:  Past Medical History  Diagnosis Date  . HIV disease   . Syphilis    Past Surgical History: History reviewed. No pertinent past surgical history.  OT Assessment/Plan/Recommendation OT Assessment Clinical Impression Statement: Pleasant 50 year old male admitted with initial left side weakness and dizziness.  Currently his LUE weakness has resolved however he now complains of increased dizziness with positional changes.  In addition he exhibits some static and dynamic balance deficits, which are also limiting his independence with basic ADLs.  Currently he requires minimum  assistance for toileting and LB selfcare. Will benefit from acute OT services to address these deficits and current increased dependence with selfcare tasks.  Feel pt will be able to reach a modified independent level for d/c back home with PRN supervision as long as he progresses at the expected rate. OT Recommendation/Assessment: Patient will need skilled OT in the acute care venue OT Problem List: Impaired balance (sitting and/or standing);Decreased knowledge of use of DME or AE;Impaired vision/perception Barriers to Discharge: Decreased caregiver support Barriers to Discharge Comments: Pt has steps to navigate to get up to his 2nd floor  apartment. OT Therapy Diagnosis : Generalized weakness (Dizziness) OT Plan OT Frequency: Min 2X/week OT Treatment/Interventions: Self-care/ADL training;DME and/or AE instruction;Neuromuscular education;Patient/family education;Balance training;Visual/perceptual remediation/compensation OT Recommendation Follow Up Recommendations: None Equipment Recommended: Tub/shower seat Individuals Consulted Consulted and Agree with Results and Recommendations: Patient OT Goals Acute Rehab OT Goals OT Goal Formulation: With patient Time For Goal Achievement: 7 days ADL Goals Pt Will Perform Lower Body Bathing: with modified independence;Sit to stand in shower;Sit to stand from chair ADL Goal: Lower Body Bathing - Progress: Progressing toward goals Pt Will Perform Lower Body Dressing: with modified independence;Sit to stand from bed ADL Goal: Lower Body Dressing - Progress: Progressing toward goals Pt Will Transfer to Toilet: with modified independence;Regular height toilet;Ambulation;with DME ADL Goal: Toilet Transfer - Progress: Progressing toward goals Pt Will Perform Toileting - Clothing Manipulation: Independently;Sitting on 3-in-1 or toilet ADL Goal: Toileting - Clothing Manipulation - Progress: Progressing toward goals Pt Will Perform Toileting - Hygiene: Independently;Sit to stand from 3-in-1/toilet ADL Goal: Toileting - Hygiene - Progress: Progressing toward goals Pt Will Perform Tub/Shower Transfer: with modified independence;Shower seat without back;Ambulation;with DME ADL Goal: Tub/Shower Transfer - Progress: Progressing toward goals Additional ADL Goal #1: Pt will report dizziness  no greater than 2/10 during performance of ADLs and simulated ADLs. ADL Goal: Additional Goal #1 - Progress: Progressing toward goals  OT Evaluation Precautions/Restrictions  Precautions Precautions: Fall Required Braces or Orthoses: No Restrictions Weight Bearing Restrictions: No Prior Functioning Home  Living Lives With: Alone Receives Help From: Family Type of Home: Apartment Home Layout: One level;Other (Comment) (2nd floor condo) Home Access: Stairs to enter Entrance Stairs-Rails: Right Entrance Stairs-Number of Steps: 14 Bathroom Shower/Tub: Tub/shower unit  Bathroom Toilet: Standard Bathroom Accessibility: Yes Prior Function Level of Independence: Independent with basic ADLs;Independent with homemaking with ambulation;Independent with gait Vocation: Other (comment) ADL ADL Eating/Feeding: Simulated;Independent Where Assessed - Eating/Feeding: Edge of bed Grooming: Simulated;Minimal assistance Where Assessed - Grooming: Standing at sink Upper Body Bathing: Simulated;Supervision/safety Where Assessed - Upper Body Bathing: Sitting, bed Lower Body Bathing: Simulated;Minimal assistance Where Assessed - Lower Body Bathing: Sit to stand from bed Upper Body Dressing: Simulated;Set up Where Assessed - Upper Body Dressing: Sitting, bed Lower Body Dressing: Simulated;Minimal assistance Where Assessed - Lower Body Dressing: Sit to stand from bed Toilet Transfer: Simulated;Minimal assistance Toilet Transfer Details (indicate cue type and reason): Pt requires constant min assist for standing balance during simulated transfer. Toilet Transfer Method: Ambulating Toileting - Clothing Manipulation: Not assessed Toileting - Hygiene: Not assessed Tub/Shower Transfer: Not assessed Tub/Shower Transfer Method: Not assessed ADL Comments: Pt with noted balance deficits during standing and transfers for simulated selfcare tasks. LOB x 3  posteriorly requiring min assist to self correct.  Pt demonstrates good awareness of deficits as well.  Reports dizziness in supine and sitting statically at a 4/10.  During simulated toilet transfer, pt reports an increase in dizziness to 7 out of 10.  Based on current balance limitations feel pt may need shower DME to reduce risk of falling. Vision/Perception    Vision - History Baseline Vision: Wears glasses only for reading Patient Visual Report:  (To be further assessed.) Vision - Assessment Additional Comments: Pt contiues to report spinning sensation in all positions. Cognition Cognition Arousal/Alertness: Awake/alert Overall Cognitive Status: Appears within functional limits for tasks assessed Orientation Level: Oriented X4 Sensation/Coordination Sensation Light Touch: Appears Intact Coordination Gross Motor Movements are Fluid and Coordinated: Yes Fine Motor Movements are Fluid and Coordinated: Yes Extremity Assessment RUE Assessment RUE Assessment: Within Functional Limits (Strength grossly WFLS) LUE Assessment LUE Assessment: Within Functional Limits (Strength WFLs for gross testing.) Mobility  Bed Mobility Bed Mobility: Yes Rolling Right: 6: Modified independent (Device/Increase time);With rail Right Sidelying to Sit: 6: Modified independent (Device/Increase time);With rails Transfers Sit to Stand: 4: Min assist;With upper extremity assist;From bed Sit to Stand Details (indicate cue type and reason): Pt with initial LOB posteriorly in standing from EOB. Exercises   End of Session OT - End of Session Activity Tolerance: Patient tolerated treatment well (Evaluation limited secondary to pt going for head CT.) Patient left: in chair;with family/visitor present General Behavior During Session: [facility] for tasks performed Cognition: [facility] for tasks performed   Elfreda Blanchet OTR/L 12/02/2010, 3:19 PM  Pager number [phone]

## 2010-12-02 NOTE — Progress Notes (Addendum)
FMTS Regarding questionable bleed of right cavernous hemangioma, spoke with Radiologist Dr. Pecolia Ades who read results of CT done on patient's admission. He says bleed of right cavernous hemangioma very unlikely based on CT and MRI brain findings.

## 2010-12-02 NOTE — Progress Notes (Signed)
Occupational Therapy Evaluation Patient Details Name: Paul Trettin MRN: [id number] DOB: Mar 12, 1960 Today's Date: 12/02/2010 15:40-16:12  2ta OT Assessment/Plan OT Assessment/Plan OT Frequency: Min 2X/week Follow Up Recommendations: None Equipment Recommended: Tub/shower seat OT Goals Acute Rehab OT Goals OT Goal Formulation: With patient Time For Goal Achievement: 7 days ADL Goals Pt Will Perform Lower Body Bathing: with modified independence;Sit to stand in shower;Sit to stand from chair ADL Goal: Lower Body Bathing - Progress: Progressing toward goals Pt Will Perform Lower Body Dressing: with modified independence;Sit to stand from bed ADL Goal: Lower Body Dressing - Progress: Progressing toward goals Pt Will Transfer to Toilet: with modified independence;Regular height toilet;Ambulation;with DME ADL Goal: Toilet Transfer - Progress: Progressing toward goals Pt Will Perform Toileting - Clothing Manipulation: Independently;Sitting on 3-in-1 or toilet ADL Goal: Toileting - Clothing Manipulation - Progress: Progressing toward goals Pt Will Perform Toileting - Hygiene: Independently;Sit to stand from 3-in-1/toilet ADL Goal: Toileting - Hygiene - Progress: Progressing toward goals Pt Will Perform Tub/Shower Transfer: with modified independence;Shower seat without back;Ambulation;with DME ADL Goal: Tub/Shower Transfer - Progress: Progressing toward goals Additional ADL Goal #1: Pt will report dizziness  no greater than 2/10 during performance of ADLs and simulated ADLs. ADL Goal: Additional Goal #1 - Progress: Progressing toward goals  OT Treatment Precautions/Restrictions  Precautions Precautions: Fall Required Braces or Orthoses: No Restrictions Weight Bearing Restrictions: No Sensation Light Touch: Appears Intact Coordination Gross Motor Movements are Fluid and Coordinated: Yes Fine Motor Movements are Fluid and Coordinated: Yes ADL ADL Eating/Feeding:  Simulated;Independent Where Assessed - Eating/Feeding: Edge of bed Grooming: Simulated;Minimal assistance Where Assessed - Grooming: Standing at sink Upper Body Bathing: Simulated;Supervision/safety Where Assessed - Upper Body Bathing: Sitting, bed Lower Body Bathing: Simulated;Minimal assistance Where Assessed - Lower Body Bathing: Sit to stand from bed Upper Body Dressing: Simulated;Set up Where Assessed - Upper Body Dressing: Sitting, bed Lower Body Dressing: Simulated;Minimal assistance Where Assessed - Lower Body Dressing: Sit to stand from bed Toilet Transfer: Simulated;Minimal assistance Toilet Transfer Details (indicate cue type and reason): Pt requires min assist secondary to to dizziness and decreased balance. Toilet Transfer Method: Ambulating Toileting - Clothing Manipulation: Not assessed Toileting - Hygiene: Not assessed Tub/Shower Transfer: Not assessed Tub/Shower Transfer Method: Not assessed ADL Comments: Pt just back from CT scan.  Examined visual tracking, saccades, and VOR function.  Pt demonstrates good occular ROM and tracking as well as saccedes.  VOR is intact however pt does report increased dizziness with vertical head shaking compared to horizontal head shaking.  He is able to maintain eyes on the target in both directions with slow head movements.  Encouraged him to increase speed while maintining eyes on clear visual target, re-emphasizing exercises PT had given earlier.  Also instructed him on compensation technique of focusing his eyes on a target  to help stabilize him while performing all positional changes.  He reports a decrease in his dizziness when doing this. Overall dizziness during session 4/10 at rest and with static sitting and standing.  Increased to 5 out of 10 with head shaking and  mobility.  Noted LOB x2 with dynamic standing requiring min assist to self correct.    Mobility  Bed Mobility Bed Mobility: Yes Rolling Right: 6: Modified independent  (Device/Increase time);With rail Right Sidelying to Sit: 6: Modified independent (Device/Increase time);With rails Transfers Sit to Stand: 4: Min assist;With upper extremity assist;From bed Sit to Stand Details (indicate cue type and reason): Pt with initial LOB posteriorly in standing from EOB. Exercises  End of Session OT - End of Session Equipment Utilized During Treatment: Gait belt Activity Tolerance: Patient tolerated treatment well Patient left: in bed;with call bell in reach Nurse Communication: Mobility status for transfers General Behavior During Session: [facility] for tasks performed Cognition: [facility] for tasks performed  [facility]  12/02/2010, 4:40 PM OTR/L Pager number [phone]

## 2010-12-02 NOTE — Progress Notes (Signed)
Speech Language Pathology  Cognitive/Linguistic Evaluation Patient Details Name: Max Ray MRN: [id number] DOB: [birth year] Today's Date: 12/02/2010  Problem List:  Patient Active Problem List  Diagnoses  . HIV DISEASE  . GENITAL HERPES  . SYPHILIS  . TINEA CORPORIS  . GLUCOSE-6-PHOSPHATE DEHYDROGENASE DEFICIENCY  . THROMBOCYTOPENIA  . ERECTILE DYSFUNCTION  . PRESBYOPIA  . ACUTE SINUSITIS, UNSPECIFIED  . SORE THROAT  . ALLERGIC RHINITIS, SEASONAL  . CELLULITIS AND ABSCESS OF FACE  . BACK PAIN, LUMBAR  . FATIGUE  . SKIN RASH  . TACHYCARDIA  . FREQUENCY, URINARY  . NOCTURIA  . OTHER ABNORMALITY OF URINATION  . ADVERSE EFFECTS, SIRS, UNSPECIFIED    Past Medical History:  Past Medical History  Diagnosis Date  . HIV disease   . Syphilis    Past Surgical History: History reviewed. No pertinent past surgical history.  SLP Assessment/Plan/Recommendation Assessment Clinical Impression Statement: Pt reports slurred speech has resolved.  Pt appears to be functioning at baseline. SLP Recommendation/Assessment: Patent does not need any further Speech Lanaguage Pathology Services No Skilled Speech Therapy: Patient at baseline level of functioning Recommendation Follow up Recommendations: None Equipment Recommended: None recommended by SLP Individuals Consulted Consulted and Agree with Results and Recommendations: Patient  SLP Goals     SLP Evaluation Prior Functioning  Prior Functional Status Cognitive/Linguistic Baseline: Within functional limits Type of Home: Apartment Lives With: Alone Receives Help From: Family Vocation: Other (comment) Cognition Cognition Overall Cognitive Status: Appears within functional limits for tasks assessed Arousal/Alertness: Awake/alert Orientation Level: Oriented X4 Attention: Selective;Sustained;Focused Focused Attention: Appears intact Sustained Attention: Appears intact Selective Attention: Appears intact Memory: Appears  intact Awareness: Appears intact Problem Solving: Appears intact Safety/Judgment: Appears intact Comprehension  Auditory Comprehension Yes/No Questions: Within Functional Limits Commands: Within Functional Limits Conversation: Complex Visual Recognition/Discrimination Discrimination: Within Function Limits Reading Comprehension Reading Status: Not tested (Pt indicated reviewing literature from PT re: balance) Expression  Expression Primary Mode of Expression: Verbal Verbal Expression Initiation: No impairment Level of Generative/Spontaneous Verbalization: Conversation Repetition: No impairment Naming: No impairment Pragmatics: No impairment Non-Verbal Means of Communication: Not applicable Written Expression Dominant Hand: Right Written Expression: Not tested Oral/Motor  Oral Motor/Sensory Function Labial ROM: Within Functional Limits Labial Symmetry: Within Functional Limits Labial Strength: Within Functional Limits Labial Sensation: Within Functional Limits Lingual ROM: Within Functional Limits Lingual Symmetry: Within Functional Limits Lingual Strength: Within Functional Limits Lingual Sensation: Within Functional Limits Facial ROM: Within Functional Limits Facial Symmetry: Within Functional Limits Facial Strength: Within Functional Limits Facial Sensation: Within Functional Limits Velum: Within Functional Limits Mandible: Within Functional Limits Motor Speech Intelligibility: Intelligible Amika Tassin B. Murvin Natal [facility], CCC-SLP [phone] 12/02/2010, 10:57 AM

## 2010-12-02 NOTE — Progress Notes (Signed)
Physical Therapy Treatment Patient Details Name: Max Ray MRN: [id number] DOB: 1960-01-19 Today's Date: 12/02/2010  PT Assessment/Plan  PT - Assessment/Plan Comments on Treatment Session: PT to assess pt for vestibular deficits secondary to c/o dizziness particularly with head movements. Pt with WNL saccades, smooth pursuit, and visual tracking however had some dizziness with tracking to the right. Pt had + head thrust to the Rt with c/o dizziness. Pt had - roll testing bilaterally. Pt had minimally + [employer] testing to the Lt. Pt uncertain if he had vertigo with testing to the Rt., while in position pt treated with Canalith Repositioning Procedure (CRP) for the Rt ear. Pt also treated with CRP for the Lt. ear secondary to positive testing. Pt dizzy with gaze stabilization exercises. PT suspecting Lt. sided BPPV (unclear posterior or anterior canal secondary to symptoms lasting just a few seconds- potentially residual residue in canal from previous BPPV episode). Pt appears to be most impaired by Rt. sided vestibular hypofunction likely secondary to stroke. Pt given x1  vertical and horizontal exercises with handout to decrease dizziness with head movements. Pt also given  Austin Miles exercises with handout. PT also recommended f/u at [facility] for vestibular rehab for progression of exercises. PT Plan: Discharge plan remains appropriate PT Frequency: Min 3X/week Follow Up Recommendations: Outpatient PT (with Vestibular Rehab) Equipment Recommended: None recommended by SLP PT Goals  Acute Rehab PT Goals PT Goal Formulation: With patient Pt will Ambulate:  (without LOB) PT Goal: Ambulate - Progress: Progressing toward goal PT Goal: Up/Down Stairs - Progress: Progressing toward goal  PT Treatment Precautions/Restrictions  Precautions Precautions: Fall (due to dizziness) Restrictions Weight Bearing Restrictions: No Mobility (including Balance) Bed  Mobility Bed Mobility: Yes (Bed mobility with vestibular testing) Rolling Right: 6: Modified independent (Device/Increase time) Rolling Left: 6: Modified independent (Device/Increase time) Right Sidelying to Sit: 6: Modified independent (Device/Increase time) Left Sidelying to Sit: 6: Modified independent (Device/Increase time) Supine to Sit: 6: Modified independent (Device/Increase time) Sit to Supine - Left: 6: Modified independent (Device/Increase time) Transfers Transfers: No Ambulation/Gait Ambulation/Gait: No    Exercise  Other Exercises Other Exercises: Canalith Repositioning to Rt. and to Lt.  Visual stabilization exercises for vestibular hypofunction.  End of Session PT - End of Session Activity Tolerance: Patient tolerated treatment well Patient left: in bed;with family/visitor present General Behavior During Session: [facility] for tasks performed Cognition: [facility] for tasks performed  Sherie Don 12/02/2010, 12:05 PM  Sherie Don) Carleene Mains PT, DPT Acute Rehabilitation [phone]

## 2010-12-02 NOTE — Progress Notes (Signed)
Subjective: Patient reports headaches. These are not usual for him.  They are frontal but if not treated become holocranial. They are relieved with Tylenol.  Continues to have dizziness that again is worse with standing and sitting.  Therapy has demonstrated and given a handout on vestibular exercises.  Orthostatic BP's were not performed.   No further left sided symptoms noted. EEG shows no epileptiform activity.  Objective: Vital signs in last 24 hours: Temp:  [97.5 F (36.4 C)-98 F (36.7 C)] 97.8 F (36.6 C) (11/29 0600) Pulse Rate:  [63-93] 72  (11/29 0600) Resp:  [16-20] 20  (11/29 0600) BP: (125-156)/(81-86) 125/86 mmHg (11/29 0600) SpO2:  [96 %-99 %] 98 % (11/29 0600)  Intake/Output from previous day: 11/28 0701 - 11/29 0700 In: 360 [P.O.:360] Out: 1800 [Urine:1800] Intake/Output this shift: Total I/O In: -  Out: 450 [Urine:450] Nutritional status: General  Neurologic Exam: Mental Status:  Alert, oriented, thought content appropriate. Speech fluent without evidence of aphasia. Able to follow 3 step commands without difficulty.  Cranial Nerves:  II- Visual fields grossly intact.  III/IV/VI-Extraocular movements intact. Pupils reactive bilaterally.  V/VII-Smile symmetric  VIII-grossly intact  IX/X-normal gag  XI-bilateral shoulder shrug  XII-midline tongue extension  Motor: 5/5 bilaterally with normal tone and bulk  Sensory: Pinprick and light touch intact throughout, bilaterally states his Left arm has increased sensation compared to right.  Deep Tendon Reflexes: 2+ and symmetric throughout  Plantars: Downgoing bilaterally  Cerebellar: Normal finger-to-nose, normal rapid alternating movements and normal heel-to-shin test.   Lab Results:  Basename 12/02/10 0642 11/30/10 1618  WBC -- 8.9  HGB -- 16.0  HCT -- 43.9  PLT -- 240  NA 141 139  K 3.4* 4.0  CL 105 105  CO2 28 26  GLUCOSE 90 97  BUN 9 11  CREATININE 1.27 1.41*  CALCIUM 8.5 8.9  LABA1C -- --    Lipid Panel  Basename 12/01/10 0600  CHOL 172  TRIG 138  HDL 53  CHOLHDL 3.2  VLDL 28  LDLCALC 91    Studies/Results: Dg Chest 2 View  11/30/2010  *RADIOLOGY REPORT*  Clinical Data: Shortness of breath.  Weakness.  Left arm numbness. Slurred speech.  CHEST - 2 VIEW  Comparison: 10/29/2007.  Findings: Normal sized heart.  Clear lungs.  Mild central peribronchial thickening with mild progression.  Minimal thoracic spine degenerative changes.  IMPRESSION: Mild bronchitic changes.  Original Report Authenticated By: Darrol Angel, M.D.   Ct Head Wo Contrast  11/30/2010  *RADIOLOGY REPORT*  Clinical Data: Slurred speech and left arm weakness.  CT HEAD WITHOUT CONTRAST  Technique:  Contiguous axial images were obtained from the base of the skull through the vertex without contrast.  Comparison: Head CT 05/25/2006.  Findings: The ventricles are normal and stable.  No extra-axial fluid collections are identified.  No CT findings for acute hemispheric infarction or intracranial hemorrhage.  There is a small hyperdensity noted on the right side near the posterior aspect of the right sylvian fissure.  This could be arachnoid granulations in the choroid fissure.  It does appear slightly more prominent when compared the prior head CT.  It is likely a benign finding.  I would however recommend an MRI without and with contrast for further evaluation (non urgent).  The brainstem and cerebellum appear normal.  The bony structures are intact.  The paranasal sinuses and mastoid air cells are clear.  IMPRESSION:  1.  No CT findings for acute intracranial process. 2.  Small  hyperdense lesion likely in or near the right choroidal fissure.  This is slightly larger and more pronounced than it was on the prior head CT.  Recommend non urgent follow-up MRI without and with contrast.  Original Report Authenticated By: P. Loralie Champagne, M.D.   Mr Angiogram Head Wo Contrast  12/01/2010  *RADIOLOGY REPORT*  Clinical  Data:  Left hand weakness, slurred speech and gait instability.  MRI HEAD WITH AND WITHOUT CONTRAST MRA HEAD WITHOUT CONTRAST MRA NECK WITHOUT AND WITH CONTRAST  Technique: Multiplanar, multiecho pulse sequences of the brain and surrounding structures were obtained according to standard protocol with and without intravenous contrast.  Angiographic images of the Circle of Willis were obtained using MRA technique without intravenous contrast.  Angiographic images of the neck were obtained using MRA technique without and with intravenous contrast.  Contrast:19 ml MultiHance.  Comparison:11/30/2010 and 05/25/2006 CT.  Report from MR brain performed 10/08/2001 and 06/03/2001.  MRI HEAD  Findings: Within the mid superior aspect of the right temporal lobe bordering the posterior inferior margin of the sylvian fissure is an 8 mm area of altered signal intensity which has characteristics of a cavernous angioma.  This appears slightly larger than the remote 2008 CT scan and may have bleed internally since 2008 contributing to slight increase in size.  On the diffusion sequence immediately anterior to this cavernoma, there is linear increased signal within the posterior right sub insular region.  This may represent reactive changes from the cavernoma although small acute infarct not entirely excluded.  No other findings of acute infarct.  Posterior sella 10.8 x 13.9 x 8.2 mm cystic-appearing structure scalloping posterior border of the anterior pituitary gland, slightly greater extension to the left bordering the left cavernous sinus.  This is a nonspecific finding and may represent a small Rathke cleft cyst.  Comment was made upon this on the 2003 MR.  I cannot determine if this has changed as images are not available for direct review.  Cerebellar tonsils minimally low-lying but within range normal limits.  No hydrocephalus.  Minimal partial opacification mastoid air cells and minimal mucosal thickening paranasal sinuses.   IMPRESSION: Right temporal lobe cavernoma.  The subtle altered signal intensity within the posterior right sub insular region on diffusion sequence may represent reactive changes to the cavernoma versus small acute infarct.  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 Stability can be confirmed on follow-up.  MRA HEAD  Findings:No abnormal vessels in the region of the right temporal lobe cavernoma.  Ectatic distal vertical cervical segment of the left internal carotid artery.  Anterior circulation without medium or large size vessel significant stenosis or occlusion.  Ectatic vertebral arteries and basilar artery without high-grade stenosis.  Branch vessel irregularity.  Slightly prominent appearance of the basilar tip.  No aneurysm noted.  IMPRESSION: No abnormal vessels extend into the region of the right temporal lobe cavernoma.  No medium or large sized vessel significant stenosis or occlusion.  Branch vessel irregularity.  Please see above.  MRA NECK  Findings:Normal configuration of the origin of the great vessels from the aortic arch.  Mild irregularity proximal left common carotid artery.  No evidence of hemodynamically significant stenosis or significant irregularity involving the carotid bifurcation on either side. Ectatic vertical cervical segment of the internal carotid arteries more notable on the left.  No evidence of significant stenosis of either vertebral artery.  IMPRESSION: No evidence of vertebral artery or carotid bifurcations significant narrowing or irregularity.  Preliminary interpretation by  Dr. Cherly Hensen discussed with Dr.  Judd Lien 11/30/2010 at 10:23 p.m.  Original Report Authenticated By: Fuller Canada, M.D.   Mr Angiogram Neck W Wo Contrast  12/01/2010  *RADIOLOGY REPORT*  Clinical Data:  Left hand weakness, slurred speech and gait instability.  MRI HEAD WITH AND WITHOUT CONTRAST MRA HEAD WITHOUT CONTRAST MRA NECK WITHOUT AND WITH CONTRAST  Technique: Multiplanar,  multiecho pulse sequences of the brain and surrounding structures were obtained according to standard protocol with and without intravenous contrast.  Angiographic images of the Circle of Willis were obtained using MRA technique without intravenous contrast.  Angiographic images of the neck were obtained using MRA technique without and with intravenous contrast.  Contrast:19 ml MultiHance.  Comparison:11/30/2010 and 05/25/2006 CT.  Report from MR brain performed 10/08/2001 and 06/03/2001.  MRI HEAD  Findings: Within the mid superior aspect of the right temporal lobe bordering the posterior inferior margin of the sylvian fissure is an 8 mm area of altered signal intensity which has characteristics of a cavernous angioma.  This appears slightly larger than the remote 2008 CT scan and may have bleed internally since 2008 contributing to slight increase in size.  On the diffusion sequence immediately anterior to this cavernoma, there is linear increased signal within the posterior right sub insular region.  This may represent reactive changes from the cavernoma although small acute infarct not entirely excluded.  No other findings of acute infarct.  Posterior sella 10.8 x 13.9 x 8.2 mm cystic-appearing structure scalloping posterior border of the anterior pituitary gland, slightly greater extension to the left bordering the left cavernous sinus.  This is a nonspecific finding and may represent a small Rathke cleft cyst.  Comment was made upon this on the 2003 MR.  I cannot determine if this has changed as images are not available for direct review.  Cerebellar tonsils minimally low-lying but within range normal limits.  No hydrocephalus.  Minimal partial opacification mastoid air cells and minimal mucosal thickening paranasal sinuses.  IMPRESSION: Right temporal lobe cavernoma.  The subtle altered signal intensity within the posterior right sub insular region on diffusion sequence may represent reactive changes to the  cavernoma versus small acute infarct.  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 Stability can be confirmed on follow-up.  MRA HEAD  Findings:No abnormal vessels in the region of the right temporal lobe cavernoma.  Ectatic distal vertical cervical segment of the left internal carotid artery.  Anterior circulation without medium or large size vessel significant stenosis or occlusion.  Ectatic vertebral arteries and basilar artery without high-grade stenosis.  Branch vessel irregularity.  Slightly prominent appearance of the basilar tip.  No aneurysm noted.  IMPRESSION: No abnormal vessels extend into the region of the right temporal lobe cavernoma.  No medium or large sized vessel significant stenosis or occlusion.  Branch vessel irregularity.  Please see above.  MRA NECK  Findings:Normal configuration of the origin of the great vessels from the aortic arch.  Mild irregularity proximal left common carotid artery.  No evidence of hemodynamically significant stenosis or significant irregularity involving the carotid bifurcation on either side. Ectatic vertical cervical segment of the internal carotid arteries more notable on the left.  No evidence of significant stenosis of either vertebral artery.  IMPRESSION: No evidence of vertebral artery or carotid bifurcations significant narrowing or irregularity.  Preliminary interpretation by Dr. Cherly Hensen discussed with Dr.  Judd Lien 11/30/2010 at 10:23 p.m.  Original Report Authenticated By: Fuller Canada, M.D.   Mr Brain  W Wo Contrast  12/01/2010  *RADIOLOGY REPORT*  Clinical Data:  Left hand weakness, slurred speech and gait instability.  MRI HEAD WITH AND WITHOUT CONTRAST MRA HEAD WITHOUT CONTRAST MRA NECK WITHOUT AND WITH CONTRAST  Technique: Multiplanar, multiecho pulse sequences of the brain and surrounding structures were obtained according to standard protocol with and without intravenous contrast.  Angiographic images of the Circle of Willis were  obtained using MRA technique without intravenous contrast.  Angiographic images of the neck were obtained using MRA technique without and with intravenous contrast.  Contrast:19 ml MultiHance.  Comparison:11/30/2010 and 05/25/2006 CT.  Report from MR brain performed 10/08/2001 and 06/03/2001.  MRI HEAD  Findings: Within the mid superior aspect of the right temporal lobe bordering the posterior inferior margin of the sylvian fissure is an 8 mm area of altered signal intensity which has characteristics of a cavernous angioma.  This appears slightly larger than the remote 2008 CT scan and may have bleed internally since 2008 contributing to slight increase in size.  On the diffusion sequence immediately anterior to this cavernoma, there is linear increased signal within the posterior right sub insular region.  This may represent reactive changes from the cavernoma although small acute infarct not entirely excluded.  No other findings of acute infarct.  Posterior sella 10.8 x 13.9 x 8.2 mm cystic-appearing structure scalloping posterior border of the anterior pituitary gland, slightly greater extension to the left bordering the left cavernous sinus.  This is a nonspecific finding and may represent a small Rathke cleft cyst.  Comment was made upon this on the 2003 MR.  I cannot determine if this has changed as images are not available for direct review.  Cerebellar tonsils minimally low-lying but within range normal limits.  No hydrocephalus.  Minimal partial opacification mastoid air cells and minimal mucosal thickening paranasal sinuses.  IMPRESSION: Right temporal lobe cavernoma.  The subtle altered signal intensity within the posterior right sub insular region on diffusion sequence may represent reactive changes to the cavernoma versus small acute infarct.  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 Stability can be confirmed on follow-up.  MRA HEAD  Findings:No abnormal vessels in the region of the  right temporal lobe cavernoma.  Ectatic distal vertical cervical segment of the left internal carotid artery.  Anterior circulation without medium or large size vessel significant stenosis or occlusion.  Ectatic vertebral arteries and basilar artery without high-grade stenosis.  Branch vessel irregularity.  Slightly prominent appearance of the basilar tip.  No aneurysm noted.  IMPRESSION: No abnormal vessels extend into the region of the right temporal lobe cavernoma.  No medium or large sized vessel significant stenosis or occlusion.  Branch vessel irregularity.  Please see above.  MRA NECK  Findings:Normal configuration of the origin of the great vessels from the aortic arch.  Mild irregularity proximal left common carotid artery.  No evidence of hemodynamically significant stenosis or significant irregularity involving the carotid bifurcation on either side. Ectatic vertical cervical segment of the internal carotid arteries more notable on the left.  No evidence of significant stenosis of either vertebral artery.  IMPRESSION: No evidence of vertebral artery or carotid bifurcations significant narrowing or irregularity.  Preliminary interpretation by Dr. Cherly Hensen discussed with Dr.  Judd Lien 11/30/2010 at 10:23 p.m.  Original Report Authenticated By: Fuller Canada, M.D.    Medications:  I have reviewed the patient's current medications. Scheduled:   . Emtricitab-Rilpivir-Tenofovir  1 tablet Oral Q supper  . heparin  5,000 Units  Subcutaneous Q8H  . loratadine  10 mg Oral Daily  . sodium chloride  3 mL Intravenous Q12H  . DISCONTD: acetaminophen  650 mg Oral Q6H  . DISCONTD: aspirin EC  81 mg Oral Daily    Assessment/Plan: Patient's presenting focal symptoms have resolved.  Now with new symptoms of dizziness and headache.  Nothing on imagine to explain this.  No evidence of seizure noted on EEG although can not fully rule this out.  1. No AED's recommended at this time 2. Continue ASA 3. Orthostatics  pending   LOS: 2 days   Thana Farr, MD [facility] [phone] 12/02/2010  12:42 PM

## 2010-12-02 NOTE — Plan of Care (Signed)
Problem: Phase I Progression Outcomes Goal: Other Phase I Outcomes/Goals Outcome: Completed/Met Date Met:  12/02/10 SLP evaluation completed.

## 2010-12-02 NOTE — Progress Notes (Signed)
FMTS Attending Daily Note: Anorah Trias MD [phone] pager office [phone] I have discussed this patient with the resident and reviewed the assessment and plan as documented above. I agree wit the resident's findings and plan.  

## 2010-12-02 NOTE — Progress Notes (Signed)
Daily Progress Note Max Ray. Max Ray, M.D., M.B.A  Family Medicine PGY-1   Subjective: Dizziness persistent over night, has worked with PT on vestibular exercises; ddenies weakness, slurred speech, changes in vision  - headache, generalized, started yesterday, no aura, relieved with Tylenol, patient claims no history of headache     Objective: Vital signs in last 24 hours: Temp:  [97.5 F (36.4 C)-98 F (36.7 C)] 97.8 F (36.6 C) (11/29 0600) Pulse Rate:  [63-110] 72  (11/29 0600) Resp:  [16-20] 20  (11/29 0600) BP: (125-156)/(81-99) 125/86 mmHg (11/29 0600) SpO2:  [96 %-99 %] 98 % (11/29 0600) Weight change:  Last BM Date: 12/01/10  Intake/Output from previous day: 11/28 0701 - 11/29 0700 In: 360 [P.O.:360] Out: 1800 [Urine:1800] Intake/Output this shift:    General appearance: alert, cooperative and no distress Resp: clear to auscultation bilaterally Cardio: regular rate and rhythm, S1, S2 normal, no murmur, click, rub or gallop GI: soft, non-tender; bowel sounds normal; no masses,  no organomegaly Neurologic: Cranial nerves: normal Sensory: normal Motor: grossly normal - did not ambulate patient this AM    Lab Results:  Lab 12/02/10 0642 11/30/10 1618  NA 141 139  K 3.4* 4.0  CL 105 105  CO2 28 26  BUN 9 11  CREATININE 1.27 1.41*  LABGLOM -- --  GLUCOSE 90 --  CALCIUM 8.5 8.9      Studies/Results: Echocardiogram: WNL Carotid Doppler U/S: WNL   Medications:  I have reviewed the patient's current medications. Scheduled:    . Emtricitab-Rilpivir-Tenofovir  1 tablet Oral Q supper  . heparin  5,000 Units Subcutaneous Q8H  . loratadine  10 mg Oral Daily  . sodium chloride  3 mL Intravenous Q12H  . DISCONTD: aspirin EC  81 mg Oral Daily   BJY:NWGNFA chloride, acetaminophen, ondansetron (ZOFRAN) IV, senna-docusate, sodium chloride  Assessment/Plan: This is a 50 YO M with a history of HIV (diagnosed 15 years ago) presenting with left hand  weakness, slurred speech, unsteady gait that started acutely 11/27, was found to have a cavernoma of right temporal lobe.   Neuro: - improved weakness and slurred speech, likely 2/2 cavernoma of right temporal lobe seen on MRI - Neurology recs EEG to r/o seizure  - New onset headache - schedule tylenol, consider rpt CT if worse or deficits develop to r/o new bleed  - Dizziness - continue vestibular exercises, possible meclizine to see if helpful   Acute kidney injury  - improved, Cr 1.27 11/29, d/c fluids b/c good PO   Chest pain 2 days ago. None currently. First set of cardiac enzymes negative.  -Echo negative   HIV Recently had labs checked 11/05/2010. HIV 1 RNA Quant < 20, CD4 count 700 with 33% Helper T cell. Patient seems to have good compliance with follow-up.  -Infectious disease recommends continuing same course of treatment fort HIV, does not believe these symptoms are ID related, will continue to follow   Nasal congestion Viral URI versus allergic rhinitis  -Will start Claritin   FEN/GI NS @ 125. Regular diet after passing bedside swallow  PPx Heparin SQ for DVT PPx  Dispo Pending TIA/stroke work-up   LOS: 2 days   Max Ray 12/02/2010, 9:17 AM

## 2010-12-03 ENCOUNTER — Other Ambulatory Visit: Payer: Self-pay

## 2010-12-03 LAB — CBC
HCT: 43.5 % (ref 39.0–52.0)
Hemoglobin: 15.9 g/dL (ref 13.0–17.0)
MCH: 35.5 pg — ABNORMAL HIGH (ref 26.0–34.0)
MCV: 97.1 fL (ref 78.0–100.0)
Platelets: 220 10*3/uL (ref 150–400)
RBC: 4.48 MIL/uL (ref 4.22–5.81)

## 2010-12-03 LAB — BASIC METABOLIC PANEL
BUN: 12 mg/dL (ref 6–23)
CO2: 27 mEq/L (ref 19–32)
Calcium: 8.9 mg/dL (ref 8.4–10.5)
Creatinine, Ser: 1.25 mg/dL (ref 0.50–1.35)
Glucose, Bld: 94 mg/dL (ref 70–99)

## 2010-12-03 MED ORDER — EMTRICITAB-RILPIVIR-TENOFOV DF 200-25-300 MG PO TABS
1.0000 | ORAL_TABLET | Freq: Every day | ORAL | Status: DC
  Administered 2010-12-03: 1 via ORAL
  Filled 2010-12-03 (×2): qty 1

## 2010-12-03 MED ORDER — ASPIRIN 81 MG PO CHEW
81.0000 mg | CHEWABLE_TABLET | Freq: Every day | ORAL | Status: DC
  Administered 2010-12-03 – 2010-12-04 (×2): 81 mg via ORAL
  Filled 2010-12-03 (×2): qty 1

## 2010-12-03 MED ORDER — MECLIZINE HCL 12.5 MG PO TABS
12.5000 mg | ORAL_TABLET | Freq: Two times a day (BID) | ORAL | Status: DC
  Administered 2010-12-03 – 2010-12-04 (×2): 12.5 mg via ORAL
  Filled 2010-12-03 (×3): qty 1

## 2010-12-03 MED ORDER — SALINE SPRAY 0.65 % NA SOLN
2.0000 | NASAL | Status: DC | PRN
  Filled 2010-12-03: qty 44

## 2010-12-03 NOTE — Progress Notes (Signed)
Physical Therapy Treatment Patient Details Name: Max Ray MRN: [id number] DOB: Sep 29, 1960 Today's Date: 12/03/2010  PT Assessment/Plan  PT - Assessment/Plan Comments on Treatment Session: Pt today had symptoms most consistent with a vestibular hypofunction/central deficit (symptoms most severe sitting with fast head movements to either side and his symptoms not as severe with bed mobility. Pt also reports long lasting rocking sensation which is not consistent with BPPV which lasts for seconds.) Pt reports he has not had much time to perform X1 viewing exercises for gaze stability, pt encouraged to continue these. Pt had significant relief of symptoms at rest and with  ambulation with verbal cues to seek out a visual target to focus on. This points to a deficit with gaze stability. Pt had improved gait stability with focusing on a target.  PT Plan: Discharge plan remains appropriate PT Frequency: Min 4X/week Follow Up Recommendations: [facility] PT (Vestibular Rehab) Equipment Recommended: None recommended by PT PT Goals  Acute Rehab PT Goals PT Goal Formulation: With patient Pt will Ambulate:  (without LOB) PT Goal: Ambulate - Progress: Progressing toward goal PT Goal: Up/Down Stairs - Progress: Progressing toward goal  PT Treatment Precautions/Restrictions  Precautions Precautions: Fall Precaution Comments: Secondary to dizziness Required Braces or Orthoses: No Restrictions Weight Bearing Restrictions: No Mobility (including Balance) Bed Mobility Bed Mobility: Yes Rolling Right: 6: Modified independent (Device/Increase time) (increased time) Right Sidelying to Sit: 6: Modified independent (Device/Increase time) (increased time) Transfers Transfers: Yes Sit to Stand: With upper extremity assist;From bed;5: Supervision Sit to Stand Details (indicate cue type and reason): supervision for safety, VCs for focusing on a target Stand to Sit: 5:  Supervision Stand to Sit Details: supervision for safety, VCs for focusing on a target Ambulation/Gait Ambulation/Gait: Yes Ambulation/Gait Assistance: 5: Supervision Ambulation/Gait Assistance Details (indicate cue type and reason): Supervision for safety secondary to some decreased gait stability Ambulation Distance (Feet): 200 Feet Assistive device: None Gait Pattern: Within Functional Limits Gait velocity: Slow, cautious gait Stairs: No  Posture/Postural Control Posture/Postural Control: No significant limitations Balance Balance Assessed: Yes Static Sitting Balance Static Sitting - Balance Support: Feet supported Static Sitting - Level of Assistance: 7: Independent Dynamic Standing Balance Dynamic Standing - Balance Support: No upper extremity supported Dynamic Standing - Level of Assistance: 5: Stand by assistance Dynamic Standing - Balance Activities: Other (comment) (walking) Dynamic Standing - Comments: very minimal decr. dynamic stability with gait when pt finding new visual target.  Exercise  Other Exercises Other Exercises: Performed X1 horizontal gaze stabilization exercises 3 repetitions for 30 sec.  End of Session PT - End of Session Equipment Utilized During Treatment: Gait belt Activity Tolerance: Patient tolerated treatment well Patient left: in bed;with call bell in reach (seated, MD Present) Nurse Communication: Mobility status for ambulation General Behavior During Session: [facility] for tasks performed Cognition: [facility] for tasks performed  Sherie Don 12/03/2010, 2:51 PM  Sherie Don) Carleene Mains PT, DPT Acute Rehabilitation [phone]

## 2010-12-03 NOTE — Progress Notes (Signed)
Daily Progress Note Max Ray. Max Ray, M.D., M.B.A  Family Medicine PGY-1   Subjective: Dizziness still present, feels unsteady, but denies room spinning; He has not been out of bed today. He is concerned that his symptoms are secondary to chronic sinusitis. Also, he denies headache, weakness, decreased sensation of extremities.    Objective: Vital signs in last 24 hours: Temp:  [97.5 F (36.4 C)-99 F (37.2 C)] 98.8 F (37.1 C) (11/30 0600) Pulse Rate:  [64-96] 88  (11/30 0600) Resp:  [18-20] 19  (11/30 0600) BP: (134-152)/(78-98) 143/98 mmHg (11/30 0600) SpO2:  [96 %-100 %] 100 % (11/30 0600) Weight change:  Last BM Date: 12/01/10  Intake/Output from previous day: 11/29 0701 - 11/30 0700 In: -  Out: 1900 [Urine:1900] Intake/Output this shift:    Physical Exam:  General::alert, oriented, no discomfort  HEENT: PERRLA, no sinus tenderness Neuro:cerbellar functions test WNL; CN III-VII grossly intact; unsteadiness of gate and standing  Cardiac:RRR, no murmur Lungs: CTA-B     Lab Results:  Lab 12/03/10 0600 12/02/10 0642 11/30/10 1618  NA 143 141 139  K 3.6 3.4* 4.0  CL 107 105 105  CO2 27 28 26   BUN 12 9 11   CREATININE 1.25 1.27 1.41*  LABGLOM -- -- --  GLUCOSE 94 -- --  CALCIUM 8.9 8.5 8.9      Studies/Results: Echocardiogram: WNL Carotid Doppler U/S: WNL   Medications:  I have reviewed the patient's current medications. Scheduled:    . Emtricitab-Rilpivir-Tenofovir  1 tablet Oral Q supper  . heparin  5,000 Units Subcutaneous Q8H  . loratadine  10 mg Oral Daily  . sodium chloride  3 mL Intravenous Q12H  . DISCONTD: acetaminophen  650 mg Oral Q6H  . DISCONTD: Emtricitab-Rilpivir-Tenofovir  1 tablet Oral Q supper  . 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   Neuro: Hospital course:  11/27: MRI shows right temporal cavernoma; no active bleed; MRA head neck clear; trop I neg; 11/28:  EKG: lateral TWI; EEG - no evidence of seizure; ID rules out neuro syphilis nor relationship to HIV; echocardiogram - EF 45-50%, no thrombus; carotid dopplers - patent; dizziness started so vestibular exercise given by PT 11/29: New onset headache, so repeat CT performed, no active bleed or change; Neuroradiology says no active bleed; rpt trop I < 0.30  11/30: restart ASA, start meclizine for dizziness if BPPV suspected   Acute kidney injury  - improved, Cr 1.27 11/29, d/c fluids b/c good PO   Chest pain 2 days ago. None currently. First set of cardiac enzymes negative. EKG - TWI in lateral leads  -Echo negative, trop I < 0.30 x 2, rpt EKG  HIV Recently had labs checked 11/05/2010. HIV 1 RNA Quant < 20, CD4 count 700 with 33% Helper T cell. Patient seems to have good compliance with follow-up.  -Infectious disease recommends continuing same course of treatment fort HIV, does not believe these symptoms are ID related, will continue to follow   Nasal congestion Viral URI versus allergic rhinitis  - claritin with minimal improvement of nasal congestion - start saline nasal drops   FEN/GI Regular diet after passing bedside swallow  PPx Heparin SQ  for DVT PPx  Dispo likely d/c home today if safe   LOS: 3 days   Mat Carne 12/03/2010, 8:19 AM

## 2010-12-03 NOTE — Progress Notes (Signed)
FMTS Attending Daily Note: Yarelin Reichardt MD [phone] pager office [phone] I have discussed this patient with the resident and reviewed the assessment and plan as documented above. I agree wit the resident's findings and plan.  

## 2010-12-03 NOTE — Progress Notes (Addendum)
INFECTIOUS DISEASES PROGRESS NOTE  50 y.o. male right-hand dominant with HIV, originally diagnosed in 1994, recent CD4 count of 700(33%)/ with undetectable VL <20, on emtricitabine/rilpivirine/tenofovir, maintains 100% adherence. He is also Known to have history of meningococcemia in 2007, PCP PNA in 2003, and hx of syphilis. The patient was in usual state of health until the evening prior to admit when he noticed having LEFT hand tingling and weakness, found to have right temporal cavernoma as etiology of symptoms  Subjective: He states that his severe headache has resolved, now having significant dizziness and room spinning. Underwent what sounds like dix-hallpike maneuver that did not exacerbate his symptoms. Hasn't ambulated as much due to dizziness, unsteady on feet. His original symptoms of numbness of left hand and weakness is now completely resolved. Underwent repeat NCHCT that did not show evidence of bleed     . aspirin  81 mg Oral Daily  . Emtricitab-Rilpivir-Tenofovir  1 tablet Oral Q supper  . heparin  5,000 Units Subcutaneous Q8H  . loratadine  10 mg Oral Daily  . sodium chloride  3 mL Intravenous Q12H  . DISCONTD: acetaminophen  650 mg Oral Q6H  . DISCONTD: Emtricitab-Rilpivir-Tenofovir  1 tablet Oral Q supper  . DISCONTD: Emtricitab-Rilpivir-Tenofovir  1 tablet Oral Q lunch     Objective: BP 134/90  Pulse 70  Temp(Src) 98.1 F (36.7 C) (Oral)  Resp 17  Ht 5\' 11"  (1.803 m)  Wt 87.091 kg (192 lb)  BMI 26.78 kg/m2  SpO2 97%  General Appearance:    Alert, cooperative, no distress, appears stated age  Head:    Normocephalic, without obvious abnormality, atraumatic  Eyes:    PERRL, conjunctiva/corneas clear, EOM's intact, fundi    benign, both eyes. No nystagmus     Nose:   Nares normal, septum midline, mucosa normal, no drainage   or sinus tenderness  Throat:   Lips, mucosa, and tongue normal; teeth and gums normal  Neck:   Supple, symmetrical, trachea midline, no  adenopathy;       thyroid:  No enlargement/tenderness/nodules; no carotid   bruit or JVD     Lungs:     Clear to auscultation bilaterally, respirations unlabored  Chest wall:    No tenderness or deformity  Heart:    Regular rate and rhythm, S1 and S2 normal, no murmur, rub   or gallop  Abdomen:     Soft, non-tender, bowel sounds active all four quadrants,    no masses, no organomegaly        Extremities:   Extremities normal, atraumatic, no cyanosis or edema  Pulses:   2+ and symmetric all extremities  Skin:   Skin color, texture, turgor normal, no rashes or lesions  Lymph nodes:   Cervical, supraclavicular, and axillary nodes normal  Neurologic:   CNII-XII intact. Normal strength, sensation and reflexes      throughout   Lab Results:  Basename 12/03/10 0600 11/30/10 1618  WBC 9.3 8.9  HGB 15.9 16.0  HCT 43.5 43.9  PLT 220 240   BMET  Basename 12/03/10 0600 12/02/10 0642  NA 143 141  K 3.6 3.4*  CL 107 105  CO2 27 28  GLUCOSE 94 90  BUN 12 9  CREATININE 1.25 1.27  CALCIUM 8.9 8.5    Studies/Results: Ct Head Wo Contrast  12/02/2010  *RADIOLOGY REPORT*  Clinical Data: Headache and dizziness.  CT HEAD WITHOUT CONTRAST  Technique:  Contiguous axial images were obtained from the base of the skull through  the vertex without contrast.  Comparison: Head CT 11/30/2010 and MRI 01/29/2010.  Findings: The ventricles are normal.  No extra-axial fluid collections are seen.  The brainstem and cerebellum are unremarkable.  No acute intracranial findings such as infarction or hemorrhage.  No mass lesions. The small cavernoma in the right temporal region is unchanged.  The bony calvarium is intact.  The visualized paranasal sinuses and mastoid air cells are clear.  IMPRESSION: No acute intracranial findings. Stable small cavernoma in the right temporal region.  Original Report Authenticated By: P. Loralie Champagne, M.D.     Assessment/Plan: HIV = well controlled, continue on complera  daily.  Dizziness = patient has remote history of BPPV but it does not appear that his current symptoms are this. Question if this is related to his presentation. Would try symptomatic relief with dimenhydrinate/dramamine? Etiology unclear. May need  vestibular work-up. Will defer to neurology for work-up   Will sign off. Will have follow up appt with patient in ID clinic in 2-3 wk. Call if questions.   LOS: 3 days   Judyann Munson 12/03/2010, 12:06 PM [phone]

## 2010-12-04 MED ORDER — ASPIRIN 81 MG PO CHEW: 81.0000 mg | CHEWABLE_TABLET | Freq: Every day | ORAL | Status: DC

## 2010-12-04 MED ORDER — MECLIZINE HCL 12.5 MG PO TABS: 12.5000 mg | ORAL_TABLET | Freq: Three times a day (TID) | ORAL | Status: AC | PRN

## 2010-12-04 NOTE — Discharge Summary (Signed)
Physician Discharge Summary  Patient ID: Max Ray MRN: [id number] DOB/AGE: Jan 15, 1960 50 y.o.  Admit date: 11/30/2010 Discharge date: 12/04/2010  Admission Diagnoses:  1. L hand weakness rule out TIA.  2. HIV  Discharge Diagnoses:  1. Possible TIA of R temporal lobe 2. Benign positional vertigo 3. HIV  Discharged Condition: good  Studies:  Echocardiogram: WNL  Carotid Doppler U/S: WNL  CT head: no acute findings, smal hypodense lesion in or near R choroidal fissure.  MRI head: R temporal lobe cavernoma; reactive change vs. Small acute infarct.    Hospital Course:  This is a 50 YO M with a history of HIV (diagnosed 15 years ago) presenting with left hand weakness, slurred speech, unsteady gait that started acutely 11/27, was found to have a cavernoma of right temporal lobe.   Neuro:  Neurology consulted and followed patient throughout his admission.  11/27: MRI shows right temporal cavernoma; no active bleed; MRA head neck clear; trop I neg;  11/28: EKG: lateral TWI; EEG - no evidence of seizure; ID rules out neuro syphilis nor relationship to HIV; echocardiogram - EF 45-50%, no thrombus; carotid dopplers - patent; dizziness started so vestibular exercise given by PT  11/29: New onset headache, so repeat CT performed, no active bleed or change; Neuroradiology reports no active bleed; rpt trop I < 0.30  11/30: restart ASA, start meclizine for dizziness if BPPV suspected  12/1: dizziness nearly completely resolved with meclizine. Patient discharged with prn meclizine, daily ASA, and vestibular rehab home exercises. Neurology recommends  outpatient neuro f/u for repeat MRI in 4-6weeks. Since Dr. Thad Ranger does not see patients on an out patient basis, he will need to see his PCP to set up outpatient f/u at [facility].   Acute kidney injury  -Cr improved with IV hydration from 1.41 to 1.25.   Chest pain 2 days ago. None currently. First set of cardiac enzymes negative.  EKG - TWI in lateral leads  -Echo negative, trop I < 0.30 x 2, rpt EKG   HIV Recently had labs checked 11/05/2010. HIV 1 RNA Quant < 20, CD4 count 700 with 33% Helper T cell. Patient seems to have good compliance with follow-up.  -Infectious disease consulted and, recommended continuing the  patient's home  course of treatment fort HIV, as they did  not believe his symptoms were ID related.    Consults: ID and neurology  Discharge Exam: Blood pressure 131/81, pulse 86, temperature 98.1 F (36.7 C), temperature source Oral, resp. rate 16, height 5\' 11"  (1.803 m), weight 192 lb (87.091 kg), SpO2 98.00%. General appearance: alert, cooperative and no distress Neurologic: Alert and oriented X 3, normal strength and tone. Normal symmetric reflexes. Normal gait. Coordination: Romberg test abnormal  Disposition:   Discharge Orders    Future Orders Please Complete By Expires   Diet - low sodium heart healthy      Increase activity slowly      Discharge instructions      Comments:   Discharge to home. You will need to f/u with your care team at the ID clinic. You need to have a repeat MRI and out patient f/u with neurology in 4-6 weeks.     Current Discharge Medication List    START taking these medications   Details  meclizine (ANTIVERT) 12.5 MG tablet Take 1 tablet (12.5 mg total) by mouth 3 (three) times daily as needed for dizziness. Qty: 30 tablet, Refills: 3      CONTINUE these medications which  have NOT CHANGED   Details  Emtricitab-Rilpivir-Tenofovir (COMPLERA) 200-25-300 MG TABS Take 1 tablet by mouth daily. With a 400-600-calorie meal. Qty: 30 tablet, Refills: 11   Associated Diagnoses: Human immunodeficiency virus (HIV) disease       Follow-up Information    Follow up with PCP at ID Clinic  in 1-2 weeks..      Follow up with [facility]. (For f/u MRI head in 4-6 weeks.  May require referral from PCP. Will need to schdule MRI.)    Contact information:   [phone]           Signed: Dessa Phi 12/04/2010, 12:48 PM

## 2010-12-04 NOTE — Progress Notes (Signed)
Patient ID: Max Ray, male   DOB: 25-Dec-1960, 51 y.o.   MRN: [id number] Daily Progress Note Family Medicine  Subjective: Dizziness has greatly improved. Now only dizzy when changing positions quickly. Patient able to walk around the room and talk on the phone without difficulty. L arms weakness has resolved.    Objective: Vital signs in last 24 hours: Temp:  [97.4 F (36.3 C)-98.5 F (36.9 C)] 98.1 F (36.7 C) (12/01 1106) Pulse Rate:  [75-91] 86  (12/01 1106) Resp:  [16-20] 16  (12/01 1106) BP: (112-145)/(78-112) 131/81 mmHg (12/01 1106) SpO2:  [94 %-99 %] 98 % (12/01 1106) Weight change:  Last BM Date: 12/01/10  Intake/Output from previous day:   Intake/Output this shift: Total I/O In: 240 [P.O.:240] Out: -   Physical Exam:  General::alert, oriented, no discomfort  HEENT: PERRLA Cardiac:RRR, no murmur Lungs: CTA-B Neuro:cerbellar functions test WNL; CN III-VII grossly intact; gait steady. Positive Romberg.     Lab Results:  Lab 12/03/10 0600 12/02/10 0642 11/30/10 1618  NA 143 141 139  K 3.6 3.4* 4.0  CL 107 105 105  CO2 27 28 26   BUN 12 9 11   CREATININE 1.25 1.27 1.41*  LABGLOM -- -- --  GLUCOSE 94 -- --  CALCIUM 8.9 8.5 8.9   Studies/Results: Echocardiogram: WNL Carotid Doppler U/S: WNL  CT head: no acute findings, smal hypodense lesion in or near R choroidal fissure.  MRI head: R temporal lobe cavernoma; reactive change vs. Small acute infarct.   Medications:  I have reviewed the patient's current medications. Scheduled:    . aspirin  81 mg Oral Daily  . Emtricitab-Rilpivir-Tenofovir  1 tablet Oral Q supper  . heparin  5,000 Units Subcutaneous Q8H  . loratadine  10 mg Oral Daily  . meclizine  12.5 mg Oral BID  . sodium chloride  3 mL Intravenous Q12H  . DISCONTD: Emtricitab-Rilpivir-Tenofovir  1 tablet Oral Q supper   BJY:NWGNFA chloride, acetaminophen, ondansetron (ZOFRAN) IV, senna-docusate, sodium chloride, sodium  chloride  Assessment/Plan: This is a 50 YO M with a history of HIV (diagnosed 15 years ago) presenting with left hand weakness, slurred speech, unsteady gait that started acutely 11/27, was found to have a cavernoma of right temporal lobe.   Neuro: Hospital course:  11/27: MRI shows right temporal cavernoma; no active bleed; MRA head neck clear; trop I neg; 11/28:  EKG: lateral TWI; EEG - no evidence of seizure; ID rules out neuro syphilis nor relationship to HIV; echocardiogram - EF 45-50%, no thrombus; carotid dopplers - patent; dizziness started so vestibular exercise given by PT 11/29: New onset headache, so repeat CT performed, no active bleed or change; Neuroradiology says no active bleed; rpt trop I < 0.30  11/30: restart ASA, start meclizine for dizziness if BPPV suspected  12/1: dizziness nearly completely resolved with meclizine. Patient stable for D/C with prn meclizine, vestibular rehab home exercises. Will need outpatient neuro f/u for repeat MRI in 4-6weeks.   Acute kidney injury  - improved, Cr 1.27 11/29, d/c fluids b/c good PO   Chest pain 2 days ago. None currently. First set of cardiac enzymes negative. EKG - TWI in lateral leads  -Echo negative, trop I < 0.30 x 2, rpt EKG  HIV Recently had labs checked 11/05/2010. HIV 1 RNA Quant < 20, CD4 count 700 with 33% Helper T cell. Patient seems to have good compliance with follow-up.  -Infectious disease recommends continuing same course of treatment fort HIV, does not believe these symptoms  are ID related, will continue to follow   Nasal congestion Viral URI versus allergic rhinitis. Paitent now afebrile. Improved.  - claritin with minimal improvement of nasal congestion - start saline nasal drops prn.   FEN/GI Regular diet after passing bedside swallow   PPx Heparin SQ for DVT PPx   Dispo to home today.    LOS: 4 days   Tayja Manzer 12/04/2010, 12:40 PM

## 2010-12-04 NOTE — Progress Notes (Signed)
Pt d/c to home. All d/c instructions and f/u appointments reviewed prescriptions returned and IV discontinued. Marc Morgans, RN

## 2010-12-04 NOTE — Discharge Summary (Signed)
Family Medicine Teaching Service  Discharge Note : Attending Sara Neal MD Pager [phone] Office [phone] I have seen and examined this patient, reviewed their chart and discussed discharge planning wit the resident. I agree with the discharge plan as above.  

## 2010-12-04 NOTE — Progress Notes (Signed)
Subjective: Pt states he is feeling better today.  Much less dizzy and no paresthesia of LUE.  Has not ambulated yet this am.  Objective: BP 112/82  Pulse 83  Temp(Src) 98.4 F (36.9 C) (Oral)  Resp 20  Ht 5\' 11"  (1.803 m)  Wt 87.091 kg (192 lb)  BMI 26.78 kg/m2  SpO2 99%  Intake/Output from previous day:   Intake/Output this shift: Total I/O In: 240 [P.O.:240] Out: -   Medications:  Scheduled:   . aspirin  81 mg Oral Daily  . Emtricitab-Rilpivir-Tenofovir  1 tablet Oral Q supper  . heparin  5,000 Units Subcutaneous Q8H  . loratadine  10 mg Oral Daily  . meclizine  12.5 mg Oral BID  . sodium chloride  3 mL Intravenous Q12H  . DISCONTD: Emtricitab-Rilpivir-Tenofovir  1 tablet Oral Q supper   Neurologic Exam: Mental Status: Alert, oriented, thought content appropriate.  Speech fluent without evidence of aphasia. Able to follow 3 step commands without difficulty. Cranial Nerves: II-Visual fields grossly intact. III/IV/VI-Extraocular movements intact.  Pupils reactive bilaterally. V/VII-Smile symmetric XII-midline tongue extension Motor: 5/5 bilaterally with normal tone and bulk Sensory: Light touch intact throughout, bilaterally Deep Tendon Reflexes: 2+ and symmetric throughout   Lab Results:  Basename 12/03/10 0600 12/02/10 0642  WBC 9.3 --  HGB 15.9 --  HCT 43.5 --  PLT 220 --  NA 143 141  K 3.6 3.4*  CL 107 105  CO2 27 28  GLUCOSE 94 90  BUN 12 9  CREATININE 1.25 1.27  CALCIUM 8.9 8.5  LABA1C -- --   Lipid Panel No results found for this basename: CHOL,TRIG,HDL,CHOLHDL,VLDL,LDLCALC in the last 72 hours  Studies/Results: Ct Head Wo Contrast  12/02/2010  *RADIOLOGY REPORT*  Clinical Data: Headache and dizziness.  CT HEAD WITHOUT CONTRAST  Technique:  Contiguous axial images were obtained from the base of the skull through the vertex without contrast.  Comparison: Head CT 11/30/2010 and MRI 01/29/2010.  Findings: The ventricles are normal.  No  extra-axial fluid collections are seen.  The brainstem and cerebellum are unremarkable.  No acute intracranial findings such as infarction or hemorrhage.  No mass lesions. The small cavernoma in the right temporal region is unchanged.  The bony calvarium is intact.  The visualized paranasal sinuses and mastoid air cells are clear.  IMPRESSION: No acute intracranial findings. Stable small cavernoma in the right temporal region.  Original Report Authenticated By: P. Loralie Champagne, M.D.    Assessment/Plan:  1. L UE paresthesia- resolved. Nothing on imaging to explain this. No evidence of seizure noted on EEG although can   not fully rule this out.  2. Dizziness- improved, vestibular dysfunction most likely. 3. T2 signal hyperintensity Right temporal lobe cavernoma may represent reactive changes to the cavernoma versus   small acute infarct- the latter less likely due to presence on multiple images of varied dates.  Recommendations: (1) Repeat MRI of the brain in 1-2 months with out patient neurology follow up.as  (2) ASA 81mg  daily.     LOS: 4 days   Marya Fossa PA-C [facility] [phone] 12/04/2010  11:06 AM

## 2010-12-04 NOTE — Progress Notes (Signed)
FMTS Attending Daily Note: Sara Neal MD [phone] pager office [phone] I have discussed this patient with the resident and reviewed the assessment and plan as documented above. I agree wit the resident's findings and plan.  

## 2011-01-21 ENCOUNTER — Ambulatory Visit: Payer: Medicare Other

## 2011-01-25 ENCOUNTER — Ambulatory Visit: Payer: Medicare Other

## 2011-02-08 NOTE — Assessment & Plan Note (Signed)
He is clinically stable on his current regimen. However, he is continuing to have chronic, long-term issues with elevations in lipids. It is perhaps, possible that his HIV therapy may be contributing to this. Therefore, we discussed considering other lipids-safe regimens that are also simplified. He voices agreement and after careful review of multiple regimens, we agreed on Complera therapy as a replacement regimen. Medication regimen,drug effects, treatment limitations, side effects, ADRs, and potential toxicities discussed in detail. Medication adherence, its importance in therapy and resistance discussed with patient. Counseling provided on prevention of transmission of HIV. Condoms offered:  Yes  Follow up visit in 6 weeks with labs 2 weeks prior to appointment. Patient verbally acknowledged information provided to them and agreed with plan of care.

## 2011-02-08 NOTE — Progress Notes (Signed)
Subjective:    Patient ID: Markus Casten is a 51 y.o. male.  Chief Complaint: HIV Follow-up Visit Garry Kader is here for follow-up of HIV infection. He is feeling unchanged since his last visit.  He claims continued adherence to therapy with good tolerance and no complications. There are not additional complaints.   Data Review: Diagnostic studies reviewed.  Review of Systems - General ROS: negative for - fatigue, fever or malaise Psychological ROS: negative for - anxiety, behavioral disorder, depression, memory difficulties or mood swings Ophthalmic ROS: negative ENT ROS: negative Endocrine ROS: negative Respiratory ROS: no cough, shortness of breath, or wheezing Cardiovascular ROS: no chest pain or dyspnea on exertion Gastrointestinal ROS: no abdominal pain, change in bowel habits, or black or bloody stools Genito-Urinary ROS: no dysuria, trouble voiding, or hematuria Neurological ROS: no TIA or stroke symptoms Dermatological ROS: negative for rash and skin lesion changes  Objective:   General appearance: alert and cooperative Head: Normocephalic, without obvious abnormality, atraumatic Eyes: conjunctivae/corneas clear. PERRL, EOM's intact. Fundi benign. Ears: normal TM's and external ear canals both ears Nose: Nares normal. Septum midline. Mucosa normal. No drainage or sinus tenderness. Throat: lips, mucosa, and tongue normal; teeth and gums normal Resp: clear to auscultation bilaterally Cardio: regular rate and rhythm, S1, S2 normal, no murmur, click, rub or gallop GI: soft, non-tender; bowel sounds normal; no masses,  no organomegaly Extremities: extremities normal, atraumatic, no cyanosis or edema Skin: Skin color, texture, turgor normal. No rashes or lesions Neurologic: Grossly normal Psych:  No vegetative signs or delusional behaviors noted.    Laboratory: From 08/16/2010 ,  CD4 count was 930 c/cmm @ 33 %. Viral load <20 copies/ml.  Total cholesterol 175,  triglycerides 153, LDL calculated at 103   Assessment/Plan:   HIV DISEASE He is clinically stable on his current regimen. However, he is continuing to have chronic, long-term issues with elevations in lipids. It is perhaps, possible that his HIV therapy may be contributing to this. Therefore, we discussed considering other lipids-safe regimens that are also simplified. He voices agreement and after careful review of multiple regimens, we agreed on Complera therapy as a replacement regimen. Medication regimen,drug effects, treatment limitations, side effects, ADRs, and potential toxicities discussed in detail. Medication adherence, its importance in therapy and resistance discussed with patient. Counseling provided on prevention of transmission of HIV. Condoms offered:  Yes  Follow up visit in 6 weeks with labs 2 weeks prior to appointment. Patient verbally acknowledged information provided to them and agreed with plan of care.      Calayah Guadarrama A. Sundra Aland, MS, [facility] [phone]  02/08/2011, 2:44 PM

## 2011-02-14 DIAGNOSIS — K1379 Other lesions of oral mucosa: Secondary | ICD-10-CM | POA: Insufficient documentation

## 2011-02-14 DIAGNOSIS — K056 Periodontal disease, unspecified: Secondary | ICD-10-CM | POA: Insufficient documentation

## 2011-02-14 NOTE — Assessment & Plan Note (Signed)
Viscous lidocaine 2% swish and spent every 6 hours when necessary 5 cc.

## 2011-02-14 NOTE — Assessment & Plan Note (Signed)
Doxycycline 100 mg by mouth twice a day, plus chlorhexidine gluconate 0.12% swish and spit twice a day. We'll try to have him moved up on the waiting list to see the dentist. He should also followup in 2 weeks.

## 2011-02-14 NOTE — Progress Notes (Signed)
Subjective:  Came in to clinic today to have labs drawn. He's had no labs drawn since November 2011. Presents with a complaint of a rash developing on his hands, and legs. States rashes, slightly pruritic, but denies any development of rash elsewhere on his body. He has a history of syphilis in the past and he expresses concern that he may have had exposure to syphilis. Again, although this was not confirmed.  Review of Systems - General ROS: negative Psychological ROS: negative Ophthalmic ROS: negative ENT ROS: negative Respiratory ROS: no cough, shortness of breath, or wheezing Cardiovascular ROS: no chest pain or dyspnea on exertion Gastrointestinal ROS: no abdominal pain, change in bowel habits, or black or bloody stools Musculoskeletal ROS: negative Neurological ROS: no TIA or stroke symptoms Dermatological ROS: As per history of present illness   Objective:    General appearance: alert and cooperative Head: Normocephalic, without obvious abnormality, atraumatic Eyes: conjunctivae/corneas clear. PERRL, EOM's intact. Fundi benign. Ears: normal TM's and external ear canals both ears Throat: lips, mucosa, and tongue normal; teeth and gums normal Resp: clear to auscultation bilaterally Cardio: regular rate and rhythm, S1, S2 normal, no murmur, click, rub or gallop GI: soft, non-tender; bowel sounds normal; no masses,  no organomegaly Male genitalia: normal, penis: no lesions or discharge. testes: no masses or tenderness. no hernias Extremities: extremities normal, atraumatic, no cyanosis or edema Pulses: 2+ and symmetric Skin: Slight erythematous rash noted to the dorsal surface of both hands, and to the anterior surface of both lower legs, macro papular in nature, not discoid. No palmar or solar surfaces were involved Neurologic: Grossly normal Psych:  No vegetative signs or delusional behaviors noted.     Assessment/Plan:  SKIN RASH His major concern regarding these vague  complaints is the possibility that he may have been exposed to syphilis. We will check an RPR today and for the rash on his hand and legs. OTC hydrocortisone therapy. Should be adequate.  HIV DISEASE No labs since November of 2011. Recommend continuing present management until we see what labs are today. Verbally acknowledged this information and agreed with plan of care. He is to return to clinic in 2 weeks for followup on his labs or sooner if his RPR is positive.       Commodore Bellew A. Sundra Aland, MS, [facility] for Infect[facility] Disease [phone]  02/14/2011,  9:29 PM

## 2011-02-14 NOTE — Assessment & Plan Note (Signed)
HIV is clinically stable. Labs to be drawn today with a followup in 2 weeks

## 2011-02-14 NOTE — Assessment & Plan Note (Signed)
His major concern regarding these vague complaints is the possibility that he may have been exposed to syphilis. We will check an RPR today and for the rash on his hand and legs. OTC hydrocortisone therapy. Should be adequate.

## 2011-02-14 NOTE — Progress Notes (Signed)
Subjective:  Came in to clinic today to have his labs drawn, but also had complaints of multiple, mouth sores, and dental pain, for the past 7 days. He says, it is been difficult to eat or swallow food. He can't chew soft foods.  Objective:  Dentition in very poor repair with multiple caries, gingival hyperplasia, with erythema at the bases, and halitosis.  Assessment/Plan:  HIV DISEASE HIV is clinically stable. Labs to be drawn today with a followup in 2 weeks  Periodontal disease Doxycycline 100 mg by mouth twice a Ray, plus chlorhexidine gluconate 0.12% swish and spit twice a Ray. We'll try to have him moved up on the waiting list to see the dentist. He should also followup in 2 weeks.  Oral pain Viscous lidocaine 2% swish and spent every 6 hours when necessary 5 cc.      Max Ray A. Sundra Aland, MS, [facility] for Infectious Disease [phone]  02/14/2011,  9:38 PM

## 2011-02-14 NOTE — Assessment & Plan Note (Addendum)
No labs since November of 2011. Recommend continuing present management until we see what labs are today. Verbally acknowledged this information and agreed with plan of care. He is to return to clinic in 2 weeks for followup on his labs or sooner if his RPR is positive.

## 2011-03-08 ENCOUNTER — Other Ambulatory Visit: Payer: Medicare Other

## 2011-03-08 ENCOUNTER — Other Ambulatory Visit: Payer: Self-pay | Admitting: *Deleted

## 2011-03-08 DIAGNOSIS — B20 Human immunodeficiency virus [HIV] disease: Secondary | ICD-10-CM

## 2011-03-09 LAB — CBC WITH DIFFERENTIAL/PLATELET
Eosinophils Absolute: 0.1 10*3/uL (ref 0.0–0.7)
Eosinophils Relative: 2 % (ref 0–5)
HCT: 38.6 % — ABNORMAL LOW (ref 39.0–52.0)
Hemoglobin: 13 g/dL (ref 13.0–17.0)
Lymphocytes Relative: 30 % (ref 12–46)
Lymphs Abs: 2.7 10*3/uL (ref 0.7–4.0)
MCH: 34.2 pg — ABNORMAL HIGH (ref 26.0–34.0)
MCV: 101.6 fL — ABNORMAL HIGH (ref 78.0–100.0)
Monocytes Relative: 8 % (ref 3–12)
RBC: 3.8 MIL/uL — ABNORMAL LOW (ref 4.22–5.81)
WBC: 9 10*3/uL (ref 4.0–10.5)

## 2011-03-09 LAB — BASIC METABOLIC PANEL WITH GFR
CO2: 27 mEq/L (ref 19–32)
Calcium: 9.5 mg/dL (ref 8.4–10.5)
Chloride: 104 mEq/L (ref 96–112)
Creat: 1.26 mg/dL (ref 0.50–1.35)
Glucose, Bld: 101 mg/dL — ABNORMAL HIGH (ref 70–99)

## 2011-03-09 LAB — T-HELPER CELL (CD4) - (RCID CLINIC ONLY)
CD4 % Helper T Cell: 35 % (ref 33–55)
CD4 T Cell Abs: 950 uL (ref 400–2700)

## 2011-03-14 ENCOUNTER — Other Ambulatory Visit: Payer: Self-pay | Admitting: *Deleted

## 2011-03-14 DIAGNOSIS — B20 Human immunodeficiency virus [HIV] disease: Secondary | ICD-10-CM

## 2011-03-14 MED ORDER — EMTRICITAB-RILPIVIR-TENOFOV DF 200-25-300 MG PO TABS: 1.0000 | ORAL_TABLET | Freq: Every day | ORAL | Status: DC

## 2011-03-22 ENCOUNTER — Encounter: Payer: Self-pay | Admitting: Infectious Disease

## 2011-03-22 ENCOUNTER — Ambulatory Visit (INDEPENDENT_AMBULATORY_CARE_PROVIDER_SITE_OTHER): Payer: Medicare Other | Admitting: Infectious Disease

## 2011-03-22 VITALS — BP 149/106 | HR 78 | Ht 72.0 in | Wt 193.0 lb

## 2011-03-22 DIAGNOSIS — F32A Depression, unspecified: Secondary | ICD-10-CM | POA: Insufficient documentation

## 2011-03-22 DIAGNOSIS — F4310 Post-traumatic stress disorder, unspecified: Secondary | ICD-10-CM

## 2011-03-22 DIAGNOSIS — F411 Generalized anxiety disorder: Secondary | ICD-10-CM

## 2011-03-22 DIAGNOSIS — A539 Syphilis, unspecified: Secondary | ICD-10-CM

## 2011-03-22 DIAGNOSIS — I10 Essential (primary) hypertension: Secondary | ICD-10-CM

## 2011-03-22 DIAGNOSIS — B20 Human immunodeficiency virus [HIV] disease: Secondary | ICD-10-CM

## 2011-03-22 DIAGNOSIS — F419 Anxiety disorder, unspecified: Secondary | ICD-10-CM

## 2011-03-22 DIAGNOSIS — D539 Nutritional anemia, unspecified: Secondary | ICD-10-CM

## 2011-03-22 DIAGNOSIS — J301 Allergic rhinitis due to pollen: Secondary | ICD-10-CM

## 2011-03-22 DIAGNOSIS — IMO0002 Reserved for concepts with insufficient information to code with codable children: Secondary | ICD-10-CM

## 2011-03-22 DIAGNOSIS — J309 Allergic rhinitis, unspecified: Secondary | ICD-10-CM | POA: Insufficient documentation

## 2011-03-22 DIAGNOSIS — G459 Transient cerebral ischemic attack, unspecified: Secondary | ICD-10-CM

## 2011-03-22 DIAGNOSIS — F528 Other sexual dysfunction not due to a substance or known physiological condition: Secondary | ICD-10-CM

## 2011-03-22 DIAGNOSIS — E291 Testicular hypofunction: Secondary | ICD-10-CM

## 2011-03-22 LAB — TESTOSTERONE: Testosterone: 420.62 ng/dL (ref 250–890)

## 2011-03-22 LAB — TSH: TSH: 0.892 u[IU]/mL (ref 0.350–4.500)

## 2011-03-22 LAB — VITAMIN B12: Vitamin B-12: 776 pg/mL (ref 211–911)

## 2011-03-22 LAB — FOLATE: Folate: 17.1 ng/mL

## 2011-03-22 MED ORDER — SILDENAFIL CITRATE 50 MG PO TABS: 50.0000 mg | ORAL_TABLET | Freq: Every day | ORAL | Status: DC | PRN

## 2011-03-22 MED ORDER — FLUTICASONE PROPIONATE 50 MCG/ACT NA SUSP: 2.0000 | Freq: Every day | NASAL | Status: DC

## 2011-03-22 MED ORDER — LISINOPRIL 20 MG PO TABS: 20.0000 mg | ORAL_TABLET | Freq: Every day | ORAL | Status: DC

## 2011-03-22 MED ORDER — LORAZEPAM 1 MG PO TABS: 1.0000 mg | ORAL_TABLET | Freq: Every evening | ORAL | Status: AC | PRN

## 2011-03-22 NOTE — Assessment & Plan Note (Signed)
RPR nonreactive

## 2011-03-22 NOTE — Assessment & Plan Note (Signed)
Asa and strartin ACEI

## 2011-03-22 NOTE — Assessment & Plan Note (Signed)
Try ativan

## 2011-03-22 NOTE — Assessment & Plan Note (Signed)
Perfect control 

## 2011-03-22 NOTE — Assessment & Plan Note (Signed)
Start ACEI and recheck bp in week with bmp

## 2011-03-22 NOTE — Assessment & Plan Note (Signed)
Give inhaled steroid

## 2011-03-22 NOTE — Assessment & Plan Note (Signed)
Check testosterone. 
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
## 2011-03-22 NOTE — Assessment & Plan Note (Signed)
Check tsh folate and b12

## 2011-03-22 NOTE — Assessment & Plan Note (Signed)
Recheck testosterone and try viagra

## 2011-03-22 NOTE — Patient Instructions (Signed)
Please schedule 30 minute appt for followup

## 2011-03-30 ENCOUNTER — Other Ambulatory Visit: Payer: Self-pay | Admitting: Infectious Disease

## 2011-03-30 DIAGNOSIS — Z113 Encounter for screening for infections with a predominantly sexual mode of transmission: Secondary | ICD-10-CM

## 2011-04-27 ENCOUNTER — Ambulatory Visit (INDEPENDENT_AMBULATORY_CARE_PROVIDER_SITE_OTHER): Payer: Medicare Other | Admitting: Internal Medicine

## 2011-04-27 ENCOUNTER — Encounter: Payer: Self-pay | Admitting: Internal Medicine

## 2011-04-27 VITALS — BP 131/90 | HR 93 | Temp 98.1°F | Ht 71.0 in | Wt 191.0 lb

## 2011-04-27 DIAGNOSIS — R220 Localized swelling, mass and lump, head: Secondary | ICD-10-CM

## 2011-04-27 DIAGNOSIS — F528 Other sexual dysfunction not due to a substance or known physiological condition: Secondary | ICD-10-CM

## 2011-04-27 MED ORDER — TADALAFIL 2.5 MG PO TABS: 1.0000 | ORAL_TABLET | Freq: Every day | ORAL | Status: DC

## 2011-04-27 NOTE — Assessment & Plan Note (Signed)
He requested Cialis daily which has been sent to his pharmacy. I did remind him that it is likely his insurance will not cover it However he is happy to pay for it.

## 2011-04-27 NOTE — Assessment & Plan Note (Signed)
There is no drainage and no signs of infection. I will have him referred for ENT to see if there is a need to biopsy and period

## 2011-04-27 NOTE — Progress Notes (Signed)
  Subjective:    Patient ID: Max Ray, male    DOB: 1960-03-12, 51 y.o.   MRN: [id number]  HPI He comes in for evaluation of a mass on his upper palate. He states that he has had this for about 6 months and initially was larger though does seem to change in size intermittently. He states it used to be more painful. He was evaluated by dental who did an x-ray of it however he is unsure of what the decided that it is. There is no associated fever or chills. He also states he was given a prescription for Viagra however he is interested in a once daily pill.   Review of Systems  Constitutional: Negative for fever, chills, fatigue and unexpected weight change.  HENT: Positive for mouth sores. Negative for facial swelling, rhinorrhea, neck stiffness, postnasal drip and ear discharge.   Gastrointestinal: Negative for nausea, abdominal pain and diarrhea.       Objective:   Physical Exam  Constitutional: He appears well-developed and well-nourished. No distress.  HENT:       Solid 1/2 cm x 1/4 cm lesion on frontal palate, no drainage, no erythema  Skin: No rash noted.          Assessment & Plan:

## 2011-05-04 ENCOUNTER — Encounter: Payer: Self-pay | Admitting: *Deleted

## 2011-05-04 NOTE — Progress Notes (Unsigned)
Patient ID: Max Ray, male   DOB: 28-Jan-1960, 51 y.o.   MRN: [id number]  Called patient to inform him that he owes a balance of < $50.00 at [facility] and they would then be more than happy to schedule an appt for eval of palate mass. I gave the numbers: [facility] [phone]) and the # he would call to pay balance [phone]). I then gave him my number [phone]) if he had any problems or questions. He stated he would pay his balance so he can get scheduled with them. Tacey Heap RN

## 2011-05-20 ENCOUNTER — Encounter: Payer: Self-pay | Admitting: *Deleted

## 2011-05-20 NOTE — Progress Notes (Signed)
Patient ID: Max Ray, male   DOB: 1960-11-12, 51 y.o.   MRN: [id number]  [facility] [phone]) to see if patient had followed thru with paying his < $50 balance and scheduling an appt. [facility] said he has not. Closing referral for ENT. Tacey Heap RN

## 2011-06-08 ENCOUNTER — Other Ambulatory Visit: Payer: Medicare Other

## 2011-06-21 ENCOUNTER — Other Ambulatory Visit: Payer: Medicare Other

## 2011-06-21 ENCOUNTER — Other Ambulatory Visit (HOSPITAL_COMMUNITY)
Admission: RE | Admit: 2011-06-21 | Discharge: 2011-06-21 | Disposition: A | Discharge status: Home / Self Care | Payer: Medicare Other | Source: Ambulatory Visit | Attending: Infectious Disease | Admitting: Infectious Disease

## 2011-06-21 ENCOUNTER — Telehealth: Payer: Self-pay | Admitting: *Deleted

## 2011-06-21 DIAGNOSIS — B20 Human immunodeficiency virus [HIV] disease: Secondary | ICD-10-CM

## 2011-06-21 DIAGNOSIS — Z113 Encounter for screening for infections with a predominantly sexual mode of transmission: Secondary | ICD-10-CM | POA: Insufficient documentation

## 2011-06-21 LAB — CBC WITH DIFFERENTIAL/PLATELET
Eosinophils Absolute: 0.4 10*3/uL (ref 0.0–0.7)
Eosinophils Relative: 4 % (ref 0–5)
HCT: 37.4 % — ABNORMAL LOW (ref 39.0–52.0)
Hemoglobin: 12.9 g/dL — ABNORMAL LOW (ref 13.0–17.0)
Lymphocytes Relative: 29 % (ref 12–46)
Lymphs Abs: 2.5 10*3/uL (ref 0.7–4.0)
MCH: 33.7 pg (ref 26.0–34.0)
MCV: 97.7 fL (ref 78.0–100.0)
Monocytes Absolute: 0.8 10*3/uL (ref 0.1–1.0)
Monocytes Relative: 9 % (ref 3–12)
Platelets: 273 10*3/uL (ref 150–400)
RBC: 3.83 MIL/uL — ABNORMAL LOW (ref 4.22–5.81)
WBC: 8.8 10*3/uL (ref 4.0–10.5)

## 2011-06-21 LAB — COMPLETE METABOLIC PANEL WITH GFR
ALT: 40 U/L (ref 0–53)
AST: 30 U/L (ref 0–37)
Alkaline Phosphatase: 63 U/L (ref 39–117)
BUN: 10 mg/dL (ref 6–23)
Creat: 1.44 mg/dL — ABNORMAL HIGH (ref 0.50–1.35)
Total Bilirubin: 0.7 mg/dL (ref 0.3–1.2)

## 2011-06-21 NOTE — Telephone Encounter (Signed)
C/o allergies unrelieved by OTC meds. Sick x 3 weeks. Yellow mucus. Denies fever. Has runny nose, sneezing, face pressure & head pressure. Taking ibuprofen. persistant cough. All worse at night.advised increased fluids, stay inside in air conditioning. Continue meds (otc) . appt made for tomorrow with Dr. Orvan Falconer at 4:15pm  I discussed the value of having a pcp. He has medicare

## 2011-06-22 ENCOUNTER — Encounter: Payer: Self-pay | Admitting: Infectious Disease

## 2011-06-22 ENCOUNTER — Ambulatory Visit: Payer: Medicare Other | Admitting: Infectious Disease

## 2011-06-22 ENCOUNTER — Ambulatory Visit (INDEPENDENT_AMBULATORY_CARE_PROVIDER_SITE_OTHER): Payer: Medicare Other | Admitting: Infectious Disease

## 2011-06-22 ENCOUNTER — Ambulatory Visit: Payer: Medicare Other | Admitting: Internal Medicine

## 2011-06-22 ENCOUNTER — Telehealth: Payer: Self-pay

## 2011-06-22 VITALS — BP 132/87 | HR 106 | Temp 97.7°F | Ht 71.0 in | Wt 197.0 lb

## 2011-06-22 DIAGNOSIS — I10 Essential (primary) hypertension: Secondary | ICD-10-CM

## 2011-06-22 DIAGNOSIS — R350 Frequency of micturition: Secondary | ICD-10-CM

## 2011-06-22 DIAGNOSIS — N289 Disorder of kidney and ureter, unspecified: Secondary | ICD-10-CM

## 2011-06-22 DIAGNOSIS — B20 Human immunodeficiency virus [HIV] disease: Secondary | ICD-10-CM

## 2011-06-22 DIAGNOSIS — J301 Allergic rhinitis due to pollen: Secondary | ICD-10-CM

## 2011-06-22 DIAGNOSIS — R809 Proteinuria, unspecified: Secondary | ICD-10-CM | POA: Insufficient documentation

## 2011-06-22 LAB — BASIC METABOLIC PANEL WITH GFR
CO2: 29 mEq/L (ref 19–32)
Calcium: 9.3 mg/dL (ref 8.4–10.5)
Creat: 1.39 mg/dL — ABNORMAL HIGH (ref 0.50–1.35)
GFR, Est African American: 68 mL/min
Glucose, Bld: 85 mg/dL (ref 70–99)
Sodium: 140 mEq/L (ref 135–145)

## 2011-06-22 LAB — LIPID PANEL
HDL: 45 mg/dL (ref >39)
LDL Cholesterol: 63 mg/dL (ref 0–99)
Total CHOL/HDL Ratio: 3.2 Ratio
Triglycerides: 186 mg/dL — ABNORMAL HIGH (ref <150)

## 2011-06-22 LAB — T-HELPER CELLS (CD4) COUNT (NOT AT ARMC)
CD4 % Helper T Cell: 35 % (ref 33–55)
CD4 T Cell Abs: 910 uL (ref 400–2700)

## 2011-06-22 LAB — HIV-1 RNA QUANT-NO REFLEX-BLD: HIV-1 RNA Quant, Log: <1.3 {Log} (ref <1.30)

## 2011-06-22 MED ORDER — FLUTICASONE PROPIONATE 50 MCG/ACT NA SUSP: 2.0000 | Freq: Every day | NASAL | Status: DC

## 2011-06-22 NOTE — Assessment & Plan Note (Signed)
Try fluticasone along with decongestants. If this fails to improve may give him a course of antibiotics.

## 2011-06-22 NOTE — Assessment & Plan Note (Signed)
See above

## 2011-06-22 NOTE — Assessment & Plan Note (Signed)
Creatinine has cleared up to 1.46. We will recheck metabolic panel and [id number], repeat urine microalbumin creatinine. He remains on lisinopril at present.

## 2011-06-22 NOTE — Progress Notes (Signed)
Subjective:    Patient ID: Max Ray, male    DOB: 1960-09-16, 51 y.o.   MRN: [id number]  HPI  Max Ray is a 51 y.o. male who is doing superbly well on his  antiviral regimen,complera,  with undetectable viral load and health cd4 count. He comes in today acutely for concerns of sinus congestion that was precipitated when he was outside at a park approximately week ago. He has tried over the counter medications it is somewhat alleviated his symptoms. He does still consume suffer from congestion and sneezing. He has not been on Flonase recently. He has not had facial pain or fevers or tooth pain. I were prescribed him some fluticasone nasally and encouraged to continue to use over-the-counter decongestants in the meantime.    Review of Systems  Constitutional: Negative for fever, chills, diaphoresis, activity change, appetite change, fatigue and unexpected weight change.  HENT: Positive for congestion, rhinorrhea and sneezing. Negative for sore throat, trouble swallowing and sinus pressure.   Eyes: Negative for photophobia and visual disturbance.  Respiratory: Negative for cough, chest tightness, shortness of breath, wheezing and stridor.   Cardiovascular: Negative for chest pain, palpitations and leg swelling.  Gastrointestinal: Negative for nausea, vomiting, abdominal pain, diarrhea, constipation, blood in stool, abdominal distention and anal bleeding.  Genitourinary: Negative for dysuria, hematuria, flank pain and difficulty urinating.  Musculoskeletal: Negative for myalgias, back pain, joint swelling, arthralgias and gait problem.  Skin: Negative for color change, pallor, rash and wound.  Neurological: Negative for dizziness, tremors, weakness and light-headedness.  Hematological: Negative for adenopathy. Does not bruise/bleed easily.  Psychiatric/Behavioral: Negative for behavioral problems, confusion, disturbed wake/sleep cycle, dysphoric mood, decreased concentration and agitation.        Objective:   Physical Exam  Constitutional: He is oriented to person, place, and time. He appears well-developed and well-nourished. No distress.  HENT:  Head: Normocephalic and atraumatic.  Nose: Mucosal edema and rhinorrhea present. No sinus tenderness, nasal deformity, septal deviation or nasal septal hematoma. Right sinus exhibits no maxillary sinus tenderness and no frontal sinus tenderness. Left sinus exhibits no maxillary sinus tenderness and no frontal sinus tenderness.  Mouth/Throat: Oropharynx is clear and moist. No oropharyngeal exudate.  Eyes: Conjunctivae and EOM are normal. Pupils are equal, round, and reactive to light. No scleral icterus.  Neck: Normal range of motion. Neck supple. No JVD present.  Cardiovascular: Normal rate, regular rhythm and normal heart sounds.  Exam reveals no gallop and no friction rub.   No murmur heard. Pulmonary/Chest: Effort normal and breath sounds normal. No respiratory distress. He has no wheezes. He has no rales. He exhibits no tenderness.  Abdominal: He exhibits no distension and no mass. There is no tenderness. There is no rebound and no guarding.  Musculoskeletal: He exhibits no edema and no tenderness.  Lymphadenopathy:    He has no cervical adenopathy.  Neurological: He is alert and oriented to person, place, and time. He has normal reflexes. He exhibits normal muscle tone. Coordination normal.  Skin: Skin is warm and dry. He is not diaphoretic. No erythema. No pallor.  Psychiatric: He has a normal mood and affect. His behavior is normal. Judgment and thought content normal.          Assessment & Plan:  HIV DISEASE Perfect control he does have slight elevation in creatinine. We'll check an HLA-b 5701 , urine my problem and creatinine ratio to  ALLERGIC RHINITIS, SEASONAL Try fluticasone along with decongestants. If this fails to improve may give him  a course of antibiotics.  HTN (hypertension) Creatinine has cleared up to  1.46. We will recheck metabolic panel and [id number], repeat urine microalbumin creatinine. He remains on lisinopril at present.  Microalbuminuria See above

## 2011-06-22 NOTE — Assessment & Plan Note (Signed)
Perfect control he does have slight elevation in creatinine. We'll check an HLA-b 5701 , urine my problem and creatinine ratio to

## 2011-06-23 LAB — MICROALBUMIN / CREATININE URINE RATIO
Creatinine, Urine: 180.9 mg/dL
Microalb Creat Ratio: 7.2 mg/g (ref 0.0–30.0)
Microalb, Ur: 1.31 mg/dL (ref 0.00–1.89)

## 2011-06-28 LAB — HLA B*5701

## 2011-07-04 ENCOUNTER — Emergency Department (HOSPITAL_COMMUNITY)
Admission: EM | Admit: 2011-07-04 | Discharge: 2011-07-04 | Disposition: A | Discharge status: Home / Self Care | Payer: Medicare Other | Source: Home / Self Care | Attending: Emergency Medicine | Admitting: Emergency Medicine

## 2011-07-04 ENCOUNTER — Encounter: Payer: Self-pay | Admitting: *Deleted

## 2011-07-04 ENCOUNTER — Encounter (HOSPITAL_COMMUNITY): Payer: Self-pay

## 2011-07-04 DIAGNOSIS — L03211 Cellulitis of face: Secondary | ICD-10-CM

## 2011-07-04 HISTORY — DX: Essential (primary) hypertension: I10

## 2011-07-04 MED ORDER — DOXYCYCLINE HYCLATE 100 MG PO CAPS: 100.0000 mg | ORAL_CAPSULE | Freq: Two times a day (BID) | ORAL | Status: AC

## 2011-07-04 NOTE — Progress Notes (Unsigned)
Patient came by the clinic today to be seen due to a swollen R cheek. Patient reported that he tried to "Get out an ingrown hair". He states that he used a straight pin and a pair of tweezers that he did not clean or sterilize prior to use. He also reports he has done this before and last time we gave him an antibiotic. Advised the patient that we do not have any open appts and that because he is very swollen and has insurance he should go to [facility]. Also advised the patient that he should call [facility] and scheulde an appt to establish with a PCP as this is something they would follow. Gave him the number and advised him to call today as they are taking new patients and not sure how long that will be. Encouraged patient not to try and do this again.

## 2011-07-04 NOTE — Discharge Instructions (Signed)
Return in 2 days, start with antibiotics today (apply heat compresses for 20 minutes at a time 3-4 times a day)Cellulitis Cellulitis is an infection of the tissue under the skin. The infected area is usually red and tender. This is caused by germs. These germs enter the body through cuts or sores. This usually happens in the arms or lower legs. HOME CARE   Take your medicine as told. Finish it even if you start to feel better.   If the infection is on the arm or leg, keep it raised (elevated).   Use a warm cloth on the infected area several times a day.   See your doctor for a follow-up visit as told.  GET HELP RIGHT AWAY IF:   You are tired or confused.   You throw up (vomit).   You have watery poop (diarrhea).   You feel ill and have muscle aches.   You have a fever.  MAKE SURE YOU:   Understand these instructions.   Will watch your condition.   Will get help right away if you are not doing well or get worse.  Document Released: 06/08/2007 Document Revised: 12/09/2010 Document Reviewed: 11/21/2008 [facility] Patient Information 2012 [place], [place].

## 2011-07-04 NOTE — ED Notes (Signed)
C/o using a pin to "work on" an ingrown hair to rt side of his face.  C/o pain, swelling to rt side of face for 2 days.

## 2011-07-04 NOTE — ED Provider Notes (Signed)
History     CSN: [id number]  Arrival date & time 07/04/11  1214   First MD Initiated Contact with Patient 07/04/11 1410      Chief Complaint  Patient presents with  . Facial Swelling    (Consider location/radiation/quality/duration/timing/severity/associated sxs/prior treatment) HPI Comments: Patient presents urgent care today complaining of an ongoing infection of an ingrown hair on the right side of his face. He tried to remove it himself with pair of twitzers but has become progressively worse and swollen for the last 2 days. Patient denies any fevers, headaches or any other constitutional symptoms, such as appetite changes or unintentional weight loss. Patient describes that this has happened to him in the past before and he took antibiotics and use heat and he was resolved without need for I&D.  The history is provided by the patient.    Past Medical History  Diagnosis Date  . HIV disease   . Syphilis   . Hypertension     History reviewed. No pertinent past surgical history.  No family history on file.  History  Substance Use Topics  . Smoking status: Never Smoker   . Smokeless tobacco: Never Used  . Alcohol Use: No     occasional      Review of Systems  Constitutional: Negative for fever, activity change, appetite change and unexpected weight change.  Skin: Positive for rash. Negative for wound.  Neurological: Negative for dizziness and numbness.    Allergies  Sulfamethoxazole w-trimethoprim and Sulfonamide derivatives  Home Medications   Current Outpatient Rx  Name Route Sig Dispense Refill  . EMTRICITAB-RILPIVIR-TENOFOVIR 200-25-300 MG PO TABS Oral Take 1 tablet by mouth daily. With a 400-600-calorie meal. 30 tablet 11  . LISINOPRIL 20 MG PO TABS Oral Take 1 tablet (20 mg total) by mouth daily. 30 tablet 11  . ASPIRIN 81 MG PO CHEW Oral Chew 1 tablet (81 mg total) by mouth daily. 30 tablet 3  . DOXYCYCLINE HYCLATE 100 MG PO CAPS Oral Take 1 capsule (100  mg total) by mouth 2 (two) times daily. 20 capsule 0  . FLUTICASONE PROPIONATE 50 MCG/ACT NA SUSP Nasal Place 2 sprays into the nose daily. 1 g 11    BP 127/90  Pulse 90  Temp 98.4 F (36.9 C) (Oral)  Resp 16  SpO2 100%  Physical Exam  Nursing note and vitals reviewed. Constitutional: He appears well-developed and well-nourished.  Non-toxic appearance. He does not have a sickly appearance. He does not appear ill. No distress.  HENT:  Head: Normocephalic.    Eyes: Conjunctivae are normal. Left eye exhibits discharge.  Neck: Neck supple. No JVD present.  Abdominal: Soft.  Lymphadenopathy:    He has no cervical adenopathy.  Skin: Rash noted. There is erythema.    ED Course  Procedures (including critical care time)  Labs Reviewed - No data to display No results found.   1. Facial cellulitis       MDM  Early facial swelling secondary to localize infection. Exam was not consistent with a fluctuant abscess as of yet. Encourage patient to start with antibiotics apply heat compresses and return in 48 hours his how I WILL be here after 4 PM. Patient agree with treatment plan and followup care        Jimmie Molly, MD 07/04/11 1450

## 2011-07-12 ENCOUNTER — Ambulatory Visit (INDEPENDENT_AMBULATORY_CARE_PROVIDER_SITE_OTHER): Payer: Medicare Other | Admitting: Internal Medicine

## 2011-07-12 ENCOUNTER — Encounter: Payer: Self-pay | Admitting: Internal Medicine

## 2011-07-12 VITALS — BP 129/91 | HR 87 | Temp 98.3°F | Ht 71.0 in | Wt 189.0 lb

## 2011-07-12 DIAGNOSIS — B20 Human immunodeficiency virus [HIV] disease: Secondary | ICD-10-CM

## 2011-07-12 DIAGNOSIS — I10 Essential (primary) hypertension: Secondary | ICD-10-CM

## 2011-07-12 DIAGNOSIS — J301 Allergic rhinitis due to pollen: Secondary | ICD-10-CM

## 2011-07-12 DIAGNOSIS — N289 Disorder of kidney and ureter, unspecified: Secondary | ICD-10-CM

## 2011-07-12 LAB — COMPLETE METABOLIC PANEL WITH GFR
AST: 24 U/L (ref 0–37)
Albumin: 4.2 g/dL (ref 3.5–5.2)
Alkaline Phosphatase: 66 U/L (ref 39–117)
BUN: 15 mg/dL (ref 6–23)
GFR, Est Non African American: 61 mL/min
Glucose, Bld: 78 mg/dL (ref 70–99)
Potassium: 4 mEq/L (ref 3.5–5.3)
Total Bilirubin: 1.2 mg/dL (ref 0.3–1.2)

## 2011-07-12 MED ORDER — LISINOPRIL 20 MG PO TABS: 20.0000 mg | ORAL_TABLET | Freq: Every day | ORAL | Status: DC

## 2011-07-12 MED ORDER — EMTRICITAB-RILPIVIR-TENOFOV DF 200-25-300 MG PO TABS: 1.0000 | ORAL_TABLET | Freq: Every day | ORAL | Status: DC

## 2011-07-12 MED ORDER — FLUTICASONE PROPIONATE 50 MCG/ACT NA SUSP: 2.0000 | Freq: Every day | NASAL | Status: DC

## 2011-07-12 MED ORDER — ASPIRIN 81 MG PO CHEW: 81.0000 mg | CHEWABLE_TABLET | Freq: Every day | ORAL | Status: DC

## 2011-07-13 NOTE — Assessment & Plan Note (Signed)
At this point this has resolved. His urine microalbumin has also resolved. I did discuss good hypertension control.

## 2011-07-13 NOTE — Progress Notes (Signed)
  Subjective:    Patient ID: Max Ray, male    DOB: [birth year], 51 y.o.   MRN: [id number]  HPI He comes in for evaluation of his creatinine. He was seen by Dr. Algis Liming recently and continues to have good compliance with his Complera though was noted that his creatinine was mildly elevated. He did get a microalbumin and a repeat of his creatinine which was up again. His GFR though is over 50. He denies any complaints. He does note that his blood pressure has been up. He does see her primary physician for that and will see him. In review of the workup, his microalbumin pressure has normalized and his creatinine was up again.     Review of Systems  Constitutional: Negative for fever and chills.  Genitourinary: Negative for dysuria, urgency, frequency, hematuria, decreased urine volume and difficulty urinating.  Neurological: Negative for dizziness, weakness and headaches.       Objective:   Physical Exam  Constitutional: He appears well-developed and well-nourished. No distress.  Cardiovascular: Normal rate, regular rhythm and normal heart sounds.  Exam reveals no gallop and no friction rub.   No murmur heard. Pulmonary/Chest: Effort normal and breath sounds normal. No respiratory distress. He has no wheezes. He has no rales.  Abdominal: Soft. Bowel sounds are normal. He exhibits no distension. There is no tenderness. There is no rebound.          Assessment & Plan:

## 2011-07-13 NOTE — Assessment & Plan Note (Addendum)
He does continue to take Complera well and his recent elevated creatinine was noted. Have rechecked it again today it has normalized. He can continue with the Complera. I also changed his medication to a 90 day supply since he does tell me he is going to be traveling a lot.  He is HLA negative though so he could use abacavir in the future if needed.

## 2011-07-14 ENCOUNTER — Telehealth: Payer: Self-pay | Admitting: *Deleted

## 2011-07-14 NOTE — Telephone Encounter (Signed)
Patient notified. Jacqueline Cockerham CMA  

## 2011-07-14 NOTE — Telephone Encounter (Signed)
Message copied by Macy Mis on Thu Jul 14, 2011  9:16 AM ------      Message from: Gardiner Barefoot      Created: Wed Jul 13, 2011  5:05 PM       Please let him know that his kidney test is normal again after being high before (his creatinine) so he can continue with the same medications.

## 2011-08-01 ENCOUNTER — Ambulatory Visit: Payer: Medicare Other

## 2011-08-01 ENCOUNTER — Ambulatory Visit (INDEPENDENT_AMBULATORY_CARE_PROVIDER_SITE_OTHER): Payer: Medicare Other | Admitting: Family Medicine

## 2011-08-01 VITALS — BP 118/78 | HR 72 | Temp 98.0°F | Resp 18 | Ht 70.0 in | Wt 187.8 lb

## 2011-08-01 DIAGNOSIS — L738 Other specified follicular disorders: Secondary | ICD-10-CM

## 2011-08-01 DIAGNOSIS — L739 Follicular disorder, unspecified: Secondary | ICD-10-CM

## 2011-08-01 MED ORDER — DOXYCYCLINE HYCLATE 100 MG PO TABS: 100.0000 mg | ORAL_TABLET | Freq: Two times a day (BID) | ORAL | Status: DC

## 2011-08-01 NOTE — Progress Notes (Signed)
51 yo disabled man with recurrent beard infection, now worsening for 2 weeks.  Has successfully treated with doxycycline in the past.  Has used needle to open the pustules and the infection spread  Objective;  Scaly 3 cm area of folliculitis left chin  Assessment:  Recurrent folliculitis  No diagnosis found.

## 2011-08-02 ENCOUNTER — Other Ambulatory Visit: Payer: Self-pay | Admitting: Infectious Disease

## 2011-08-02 DIAGNOSIS — B20 Human immunodeficiency virus [HIV] disease: Secondary | ICD-10-CM

## 2011-08-08 ENCOUNTER — Other Ambulatory Visit: Payer: Medicare Other

## 2011-08-09 ENCOUNTER — Other Ambulatory Visit (INDEPENDENT_AMBULATORY_CARE_PROVIDER_SITE_OTHER): Payer: Medicare Other

## 2011-08-09 DIAGNOSIS — B20 Human immunodeficiency virus [HIV] disease: Secondary | ICD-10-CM

## 2011-08-10 LAB — HEPATITIS C ANTIBODY: HCV Ab: NEGATIVE

## 2011-08-30 ENCOUNTER — Ambulatory Visit: Payer: Medicare Other

## 2011-10-11 NOTE — Telephone Encounter (Signed)
tkk 

## 2011-12-13 ENCOUNTER — Other Ambulatory Visit (HOSPITAL_COMMUNITY)
Admission: RE | Admit: 2011-12-13 | Discharge: 2011-12-13 | Disposition: A | Discharge status: Home / Self Care | Payer: Medicare Other | Source: Ambulatory Visit | Attending: Infectious Diseases | Admitting: Infectious Diseases

## 2011-12-13 ENCOUNTER — Encounter: Payer: Self-pay | Admitting: Infectious Diseases

## 2011-12-13 ENCOUNTER — Ambulatory Visit (INDEPENDENT_AMBULATORY_CARE_PROVIDER_SITE_OTHER): Payer: Medicare Other | Admitting: Infectious Diseases

## 2011-12-13 ENCOUNTER — Other Ambulatory Visit: Payer: Medicare Other

## 2011-12-13 VITALS — BP 129/92 | HR 96 | Temp 98.8°F

## 2011-12-13 DIAGNOSIS — N289 Disorder of kidney and ureter, unspecified: Secondary | ICD-10-CM

## 2011-12-13 DIAGNOSIS — B20 Human immunodeficiency virus [HIV] disease: Secondary | ICD-10-CM

## 2011-12-13 DIAGNOSIS — I10 Essential (primary) hypertension: Secondary | ICD-10-CM

## 2011-12-13 DIAGNOSIS — Z113 Encounter for screening for infections with a predominantly sexual mode of transmission: Secondary | ICD-10-CM | POA: Insufficient documentation

## 2011-12-13 DIAGNOSIS — Z23 Encounter for immunization: Secondary | ICD-10-CM

## 2011-12-13 LAB — CBC WITH DIFFERENTIAL/PLATELET
Basophils Absolute: 0 10*3/uL (ref 0.0–0.1)
Basophils Relative: 0 % (ref 0–1)
Eosinophils Absolute: 0.1 10*3/uL (ref 0.0–0.7)
HCT: 42 % (ref 39.0–52.0)
Hemoglobin: 14.7 g/dL (ref 13.0–17.0)
MCH: 34.5 pg — ABNORMAL HIGH (ref 26.0–34.0)
MCHC: 35 g/dL (ref 30.0–36.0)
Monocytes Absolute: 0.6 10*3/uL (ref 0.1–1.0)
Monocytes Relative: 7 % (ref 3–12)
Neutrophils Relative %: 70 % (ref 43–77)
RDW: 12.8 % (ref 11.5–15.5)

## 2011-12-13 NOTE — Progress Notes (Signed)
  Subjective:    Patient ID: Max Ray, male    DOB: 01-31-1960, 51 y.o.   MRN: [id number]  HPI 51 yo M with hx of HIV+. Is on complera. [place] and wants to get screened for UTI and STI's. Has had some back pain, increasing for last few weeks. Has been taking  Ibuprofen 1g tid. No urinary d/c, cloudiness, dysuria. Feels like his urine has been darker than usual.  Also, was started on HTN rx ~ 6 months ago.  HIV 1 RNA Quant (copies/mL)  Date Value  06/21/2011 <20   03/08/2011 <20   11/05/2010 Comment: HIV 1 RNA not detected.      CD4 T Cell Abs (cmm)  Date Value  06/21/2011 910   03/08/2011 950   11/05/2010 700       Review of Systems  Constitutional: Negative for appetite change and unexpected weight change.  Cardiovascular: Negative for chest pain.  Gastrointestinal: Negative for diarrhea and constipation.  Genitourinary: Negative for dysuria and difficulty urinating.  Neurological: Negative for headaches.       Objective:   Physical Exam  Constitutional: He appears well-developed and well-nourished.  Eyes: EOM are normal. Pupils are equal, round, and reactive to light.  Neck: Neck supple.  Cardiovascular: Normal rate, regular rhythm and normal heart sounds.   Pulmonary/Chest: Effort normal and breath sounds normal.  Abdominal: Soft. Bowel sounds are normal. There is no tenderness.  Lymphadenopathy:    He has no cervical adenopathy.          Assessment & Plan:

## 2011-12-13 NOTE — Assessment & Plan Note (Addendum)
Is getting labs today. Will add STI screening. Will give him condoms as well. Spoke about condom use despite his partner being HIV+ , risk of transmission of virus. give him flu shot today as well. Will f/u as previous scheduled for his routine labs/visit. Will call him if any STIs detected.

## 2011-12-13 NOTE — Assessment & Plan Note (Signed)
bp is well controlled today. Continue to monitor.Marland KitchenMarland Kitchen

## 2011-12-13 NOTE — Assessment & Plan Note (Signed)
Advised him to take no more than ibuprofen 800mg  tid with food. Can take tylenol as well.

## 2011-12-14 LAB — COMPLETE METABOLIC PANEL WITH GFR
Alkaline Phosphatase: 59 U/L (ref 39–117)
BUN: 15 mg/dL (ref 6–23)
GFR, Est Non African American: 64 mL/min
Glucose, Bld: 107 mg/dL — ABNORMAL HIGH (ref 70–99)
Total Bilirubin: 1.2 mg/dL (ref 0.3–1.2)

## 2011-12-14 LAB — T-HELPER CELL (CD4) - (RCID CLINIC ONLY): CD4 % Helper T Cell: 33 % (ref 33–55)

## 2011-12-14 LAB — URINALYSIS, ROUTINE W REFLEX MICROSCOPIC
Bilirubin Urine: NEGATIVE
Leukocytes, UA: NEGATIVE
Nitrite: NEGATIVE
Protein, ur: NEGATIVE mg/dL
Urobilinogen, UA: 1 mg/dL (ref 0.0–1.0)

## 2011-12-14 LAB — HIV-1 RNA QUANT-NO REFLEX-BLD
HIV 1 RNA Quant: <20 copies/mL (ref <20)
HIV-1 RNA Quant, Log: <1.3 {Log} (ref <1.30)

## 2011-12-15 LAB — URINE CULTURE: Colony Count: NO GROWTH

## 2011-12-16 ENCOUNTER — Telehealth: Payer: Self-pay

## 2011-12-16 NOTE — Telephone Encounter (Signed)
Pt informed of RPR results and urine testing.    Laurell Josephs, RN

## 2011-12-19 ENCOUNTER — Ambulatory Visit: Payer: Medicare Other | Admitting: Infectious Diseases

## 2012-01-17 ENCOUNTER — Telehealth: Payer: Self-pay | Admitting: *Deleted

## 2012-01-17 ENCOUNTER — Ambulatory Visit: Payer: Medicare Other | Admitting: Internal Medicine

## 2012-01-17 NOTE — Telephone Encounter (Signed)
Called patient and rescheduled his appt, he no showed today. Max Ray

## 2012-01-26 ENCOUNTER — Encounter: Payer: Self-pay | Admitting: Internal Medicine

## 2012-01-26 ENCOUNTER — Ambulatory Visit (INDEPENDENT_AMBULATORY_CARE_PROVIDER_SITE_OTHER): Payer: Medicare Other | Admitting: Internal Medicine

## 2012-01-26 VITALS — BP 134/92 | HR 82 | Temp 97.8°F | Ht 71.0 in | Wt 197.0 lb

## 2012-01-26 DIAGNOSIS — Z79899 Other long term (current) drug therapy: Secondary | ICD-10-CM

## 2012-01-26 DIAGNOSIS — B20 Human immunodeficiency virus [HIV] disease: Secondary | ICD-10-CM

## 2012-01-26 DIAGNOSIS — J301 Allergic rhinitis due to pollen: Secondary | ICD-10-CM

## 2012-01-26 MED ORDER — FLUTICASONE PROPIONATE 50 MCG/ACT NA SUSP: 2.0000 | Freq: Every day | NASAL | Status: DC

## 2012-01-26 NOTE — Assessment & Plan Note (Addendum)
He is doing well on his regimen and will continue. He will return in about 4 months for routine followup including a fasting lipid panel.  He was again reminded to use condoms

## 2012-01-26 NOTE — Progress Notes (Signed)
  Subjective:    Patient ID: Max Ray, male    DOB: 1960-03-13, 52 y.o.   MRN: [id number]  HPI He comes in for follow up of his HIV. He was seen by his primary provider in December when he was here for his lab visit and was interested in getting screened for sexually-transmitted infections. Workup at that time was negative. He is here now to followup with his lab results. He remains undetectable with a good CD4 count. His creatinine is stable and is in the normal range at this time. He has reduced his NSAID use. He has reduced his salt and his blood pressure has maintained   Review of Systems  Constitutional: Negative for activity change and fatigue.  HENT: Negative for sore throat and trouble swallowing.   Respiratory: Negative for shortness of breath.   Cardiovascular: Negative for chest pain, palpitations and leg swelling.  Neurological: Negative for dizziness and headaches.       Objective:   Physical Exam  Constitutional: He appears well-developed and well-nourished. No distress.  Cardiovascular: Normal rate, regular rhythm and normal heart sounds.  Exam reveals no gallop and no friction rub.   No murmur heard. Pulmonary/Chest: Effort normal and breath sounds normal. No respiratory distress. He has no wheezes. He has no rales.          Assessment & Plan:

## 2012-01-26 NOTE — Patient Instructions (Signed)
Come fasting for your next lab visit

## 2012-02-21 ENCOUNTER — Ambulatory Visit: Payer: Medicare Other

## 2012-03-08 ENCOUNTER — Other Ambulatory Visit: Payer: Self-pay | Admitting: Family Medicine

## 2012-03-15 ENCOUNTER — Other Ambulatory Visit: Payer: Self-pay | Admitting: *Deleted

## 2012-03-15 ENCOUNTER — Ambulatory Visit (INDEPENDENT_AMBULATORY_CARE_PROVIDER_SITE_OTHER): Payer: Medicare Other | Admitting: Emergency Medicine

## 2012-03-15 VITALS — BP 112/78 | HR 117 | Temp 97.8°F | Resp 16 | Ht 71.0 in | Wt 194.6 lb

## 2012-03-15 DIAGNOSIS — N529 Male erectile dysfunction, unspecified: Secondary | ICD-10-CM | POA: Insufficient documentation

## 2012-03-15 MED ORDER — TADALAFIL 5 MG PO TABS: 5.0000 mg | ORAL_TABLET | Freq: Every day | ORAL | Status: DC

## 2012-03-15 MED ORDER — EMTRICITAB-RILPIVIR-TENOFOV DF 200-25-300 MG PO TABS: 1.0000 | ORAL_TABLET | Freq: Every day | ORAL | Status: DC

## 2012-03-15 MED ORDER — DOXYCYCLINE HYCLATE 100 MG PO TABS: 100.0000 mg | ORAL_TABLET | Freq: Two times a day (BID) | ORAL | Status: AC

## 2012-03-15 NOTE — Patient Instructions (Addendum)
Folliculitis   Folliculitis is redness, soreness, and swelling (inflammation) of the hair follicles. This condition can occur anywhere on the body. People with weakened immune systems, diabetes, or obesity have a greater risk of getting folliculitis.  CAUSES   Bacterial infection. This is the most common cause.   Fungal infection.   Viral infection.   Contact with certain chemicals, especially oils and tars.  Long-term folliculitis can result from bacteria that live in the nostrils. The bacteria may trigger multiple outbreaks of folliculitis over time.  SYMPTOMS  Folliculitis most commonly occurs on the scalp, thighs, legs, back, buttocks, and areas where hair is shaved frequently. An early sign of folliculitis is a small, white or yellow, pus-filled, itchy lesion (pustule). These lesions appear on a red, inflamed follicle. They are usually less than 0.2 inches (5 mm) wide. When there is an infection of the follicle that goes deeper, it becomes a boil or furuncle. A group of closely packed boils creates a larger lesion (carbuncle). Carbuncles tend to occur in hairy, sweaty areas of the body.  DIAGNOSIS   Your caregiver can usually tell what is wrong by doing a physical exam. A sample may be taken from one of the lesions and tested in a lab. This can help determine what is causing your folliculitis.  TREATMENT   Treatment may include:   Applying warm compresses to the affected areas.   Taking antibiotic medicines orally or applying them to the skin.   Draining the lesions if they contain a large amount of pus or fluid.   Laser hair removal for cases of long-lasting folliculitis. This helps to prevent regrowth of the hair.  HOME CARE INSTRUCTIONS   Apply warm compresses to the affected areas as directed by your caregiver.   If antibiotics are prescribed, take them as directed. Finish them even if you start to feel better.   You may take over-the-counter medicines to relieve itching.   Do not shave  irritated skin.   Follow up with your caregiver as directed.  SEEK IMMEDIATE MEDICAL CARE IF:    You have increasing redness, swelling, or pain in the affected area.   You have a fever.  MAKE SURE YOU:   Understand these instructions.   Will watch your condition.   Will get help right away if you are not doing well or get worse.  Document Released: 02/28/2001 Document Revised: 06/21/2011 Document Reviewed: 03/22/2011  [employer] Patient Information 2013 [employer].
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
## 2012-03-23 ENCOUNTER — Other Ambulatory Visit: Payer: Self-pay | Admitting: Infectious Disease

## 2012-04-05 ENCOUNTER — Other Ambulatory Visit: Payer: Self-pay | Admitting: Infectious Disease

## 2012-11-01 ENCOUNTER — Other Ambulatory Visit: Payer: Self-pay | Admitting: Infectious Disease

## 2012-11-01 ENCOUNTER — Other Ambulatory Visit (HOSPITAL_COMMUNITY)
Admission: RE | Admit: 2012-11-01 | Discharge: 2012-11-01 | Disposition: A | Discharge status: Home / Self Care | Payer: Medicare Other | Source: Ambulatory Visit | Attending: Infectious Diseases | Admitting: Infectious Diseases

## 2012-11-01 ENCOUNTER — Ambulatory Visit: Payer: Medicare Other

## 2012-11-01 ENCOUNTER — Other Ambulatory Visit: Payer: Medicare Other

## 2012-11-01 DIAGNOSIS — Z7251 High risk heterosexual behavior: Secondary | ICD-10-CM

## 2012-11-01 DIAGNOSIS — Z23 Encounter for immunization: Secondary | ICD-10-CM

## 2012-11-01 DIAGNOSIS — B20 Human immunodeficiency virus [HIV] disease: Secondary | ICD-10-CM

## 2012-11-01 DIAGNOSIS — Z113 Encounter for screening for infections with a predominantly sexual mode of transmission: Secondary | ICD-10-CM | POA: Insufficient documentation

## 2012-11-01 LAB — COMPLETE METABOLIC PANEL WITH GFR
ALT: 18 U/L (ref 0–53)
Albumin: 4 g/dL (ref 3.5–5.2)
CO2: 30 mEq/L (ref 19–32)
Calcium: 9.1 mg/dL (ref 8.4–10.5)
Chloride: 104 mEq/L (ref 96–112)
GFR, Est African American: 74 mL/min
GFR, Est Non African American: 64 mL/min
Glucose, Bld: 68 mg/dL — ABNORMAL LOW (ref 70–99)
Potassium: 3.7 mEq/L (ref 3.5–5.3)
Sodium: 140 mEq/L (ref 135–145)
Total Protein: 7 g/dL (ref 6.0–8.3)

## 2012-11-01 LAB — CBC WITH DIFFERENTIAL/PLATELET
Basophils Absolute: 0 10*3/uL (ref 0.0–0.1)
Basophils Relative: 0 % (ref 0–1)
Eosinophils Relative: 3 % (ref 0–5)
HCT: 40.1 % (ref 39.0–52.0)
MCHC: 34.4 g/dL (ref 30.0–36.0)
MCV: 94.6 fL (ref 78.0–100.0)
Monocytes Absolute: 0.5 10*3/uL (ref 0.1–1.0)
Neutro Abs: 4.7 10*3/uL (ref 1.7–7.7)
Platelets: 236 10*3/uL (ref 150–400)
RDW: 13.5 % (ref 11.5–15.5)

## 2012-11-01 NOTE — Addendum Note (Signed)
Addended by: Starleen Arms D on: 11/01/2012 03:39 PM   Modules accepted: Orders

## 2012-11-02 LAB — T-HELPER CELL (CD4) - (RCID CLINIC ONLY): CD4 % Helper T Cell: 28 % — ABNORMAL LOW (ref 33–55)

## 2012-11-07 ENCOUNTER — Other Ambulatory Visit: Payer: Self-pay | Admitting: *Deleted

## 2012-11-07 ENCOUNTER — Telehealth: Payer: Self-pay | Admitting: Infectious Disease

## 2012-11-07 DIAGNOSIS — B20 Human immunodeficiency virus [HIV] disease: Secondary | ICD-10-CM

## 2012-11-07 MED ORDER — EMTRICITAB-RILPIVIR-TENOFOV DF 200-25-300 MG PO TABS: 1.0000 | ORAL_TABLET | Freq: Every day | ORAL | Status: DC

## 2012-11-07 NOTE — Telephone Encounter (Signed)
VL is now at 700 needs genotype added to his labs

## 2012-11-08 ENCOUNTER — Other Ambulatory Visit: Payer: Self-pay | Admitting: Licensed Clinical Social Worker

## 2012-11-08 DIAGNOSIS — B20 Human immunodeficiency virus [HIV] disease: Secondary | ICD-10-CM

## 2012-11-08 NOTE — Telephone Encounter (Signed)
Ok the lab will order it

## 2012-11-09 LAB — HIV-1 GENOTYPR PLUS

## 2012-11-15 ENCOUNTER — Encounter: Payer: Self-pay | Admitting: Infectious Diseases

## 2012-11-15 ENCOUNTER — Ambulatory Visit (INDEPENDENT_AMBULATORY_CARE_PROVIDER_SITE_OTHER): Payer: Medicare Other | Admitting: Infectious Diseases

## 2012-11-15 VITALS — BP 146/93 | HR 88 | Temp 98.5°F | Ht 71.0 in | Wt 185.0 lb

## 2012-11-15 DIAGNOSIS — Z79899 Other long term (current) drug therapy: Secondary | ICD-10-CM

## 2012-11-15 DIAGNOSIS — N289 Disorder of kidney and ureter, unspecified: Secondary | ICD-10-CM

## 2012-11-15 DIAGNOSIS — Z113 Encounter for screening for infections with a predominantly sexual mode of transmission: Secondary | ICD-10-CM

## 2012-11-15 DIAGNOSIS — Z23 Encounter for immunization: Secondary | ICD-10-CM

## 2012-11-15 DIAGNOSIS — B20 Human immunodeficiency virus [HIV] disease: Secondary | ICD-10-CM

## 2012-11-15 NOTE — Assessment & Plan Note (Signed)
Doing well. Has gotten flu. Will recheck his VL to make sure he has gotten back to undetectable. RPR/GC/CHlamydia all (-) last month. WIl vax for Hep A.

## 2012-11-15 NOTE — Progress Notes (Signed)
  Subjective:    Patient ID: Max Ray, male    DOB: Jun 08, 1960, 52 y.o.   MRN: [id number]  HPI 52 yo M with hx of HIV+. Is on complera. Was off medicine for ~ 1 month due to being in [name]) was there 5 months, planned to be there 3 months. Is Hep B SAb+.  Lost 15-20# when off meds, but gained back since restarting.   HIV 1 RNA Quant (copies/mL)  Date Value  11/01/2012 770*  12/13/2011 <20   06/21/2011 <20      CD4 T Cell Abs (/uL)  Date Value  11/01/2012 690   12/13/2011 770   06/21/2011 910      Review of Systems  Constitutional: Positive for unexpected weight change. Negative for appetite change.  Respiratory: Negative for shortness of breath.   Gastrointestinal: Negative for diarrhea and constipation.  Genitourinary: Negative for difficulty urinating.  Neurological: Negative for headaches.       Objective:   Physical Exam  Constitutional: He appears well-developed and well-nourished.  HENT:  Mouth/Throat: No oropharyngeal exudate.  Eyes: EOM are normal. Pupils are equal, round, and reactive to light.  Neck: Neck supple.  Cardiovascular: Normal rate, regular rhythm and normal heart sounds.   Pulmonary/Chest: Effort normal and breath sounds normal.  Abdominal: Soft. Bowel sounds are normal. There is no tenderness. There is no rebound.  Lymphadenopathy:    He has no cervical adenopathy.          Assessment & Plan:

## 2012-11-15 NOTE — Assessment & Plan Note (Signed)
Cr improved and stable at 1.29 last 2 blood draws.

## 2012-11-15 NOTE — Addendum Note (Signed)
Addended by: Andree Coss on: 11/15/2012 02:21 PM   Modules accepted: Orders

## 2012-11-18 LAB — HIV-1 RNA ULTRAQUANT REFLEX TO GENTYP+: HIV 1 RNA Quant: 409 copies/mL — ABNORMAL HIGH (ref <20)

## 2013-01-21 ENCOUNTER — Telehealth: Payer: Self-pay | Admitting: Infectious Disease

## 2013-01-21 DIAGNOSIS — B20 Human immunodeficiency virus [HIV] disease: Secondary | ICD-10-CM

## 2013-01-21 NOTE — Telephone Encounter (Signed)
Patient has detectable viral load above 400  He needs repeat Viral load  He also needs ADAP renewal for Spring  Can we get him in for labs this, week, can overbook visit with me if necessary and have him meet with [facility]

## 2013-01-22 ENCOUNTER — Telehealth: Payer: Self-pay | Admitting: *Deleted

## 2013-01-22 ENCOUNTER — Encounter: Payer: Self-pay | Admitting: *Deleted

## 2013-01-22 NOTE — Telephone Encounter (Signed)
Called the patient per Dr Tommy Medal and was unable to reach him or leave a message as his phone does not have a voice mail box. Will send him a letter.

## 2013-02-12 ENCOUNTER — Other Ambulatory Visit (HOSPITAL_COMMUNITY)
Admission: RE | Admit: 2013-02-12 | Discharge: 2013-02-12 | Disposition: A | Discharge status: Home / Self Care | Payer: Medicare Other | Source: Ambulatory Visit | Attending: Infectious Diseases | Admitting: Infectious Diseases

## 2013-02-12 ENCOUNTER — Ambulatory Visit (INDEPENDENT_AMBULATORY_CARE_PROVIDER_SITE_OTHER): Payer: Medicare Other | Admitting: Infectious Diseases

## 2013-02-12 ENCOUNTER — Other Ambulatory Visit: Payer: Self-pay | Admitting: *Deleted

## 2013-02-12 ENCOUNTER — Encounter: Payer: Self-pay | Admitting: Infectious Diseases

## 2013-02-12 VITALS — BP 149/79 | HR 93 | Temp 97.2°F | Wt 189.0 lb

## 2013-02-12 DIAGNOSIS — Z113 Encounter for screening for infections with a predominantly sexual mode of transmission: Secondary | ICD-10-CM | POA: Insufficient documentation

## 2013-02-12 DIAGNOSIS — N529 Male erectile dysfunction, unspecified: Secondary | ICD-10-CM

## 2013-02-12 DIAGNOSIS — Z79899 Other long term (current) drug therapy: Secondary | ICD-10-CM

## 2013-02-12 DIAGNOSIS — I10 Essential (primary) hypertension: Secondary | ICD-10-CM

## 2013-02-12 DIAGNOSIS — R350 Frequency of micturition: Secondary | ICD-10-CM

## 2013-02-12 DIAGNOSIS — B20 Human immunodeficiency virus [HIV] disease: Secondary | ICD-10-CM

## 2013-02-12 LAB — CBC
HEMATOCRIT: 36.6 % — AB (ref 39.0–52.0)
Hemoglobin: 12.2 g/dL — ABNORMAL LOW (ref 13.0–17.0)
MCH: 33.8 pg (ref 26.0–34.0)
MCHC: 33.3 g/dL (ref 30.0–36.0)
MCV: 101.4 fL — ABNORMAL HIGH (ref 78.0–100.0)
PLATELETS: 274 10*3/uL (ref 150–400)
RBC: 3.61 MIL/uL — ABNORMAL LOW (ref 4.22–5.81)
RDW: 14.3 % (ref 11.5–15.5)
WBC: 17.6 10*3/uL — ABNORMAL HIGH (ref 4.0–10.5)

## 2013-02-12 LAB — COMPREHENSIVE METABOLIC PANEL
ALBUMIN: 4.3 g/dL (ref 3.5–5.2)
ALT: 13 U/L (ref 0–53)
AST: 23 U/L (ref 0–37)
Alkaline Phosphatase: 61 U/L (ref 39–117)
BUN: 9 mg/dL (ref 6–23)
CO2: 29 mEq/L (ref 19–32)
CREATININE: 1.19 mg/dL (ref 0.50–1.35)
Calcium: 9.2 mg/dL (ref 8.4–10.5)
Chloride: 103 mEq/L (ref 96–112)
Glucose, Bld: 58 mg/dL — ABNORMAL LOW (ref 70–99)
Potassium: 3.5 mEq/L (ref 3.5–5.3)
Sodium: 140 mEq/L (ref 135–145)
Total Bilirubin: 1.4 mg/dL — ABNORMAL HIGH (ref 0.2–1.2)
Total Protein: 7.1 g/dL (ref 6.0–8.3)

## 2013-02-12 LAB — LIPID PANEL
CHOL/HDL RATIO: 2.8 ratio
Cholesterol: 137 mg/dL (ref 0–200)
HDL: 49 mg/dL (ref >39)
LDL Cholesterol: 63 mg/dL (ref 0–99)
Triglycerides: 123 mg/dL (ref <150)
VLDL: 25 mg/dL (ref 0–40)

## 2013-02-12 MED ORDER — SILDENAFIL CITRATE 50 MG PO TABS: 50.0000 mg | ORAL_TABLET | Freq: Every day | ORAL | Status: DC | PRN

## 2013-02-12 NOTE — Assessment & Plan Note (Signed)
States he is being adherent. Will check his routine labs, genotype. See him back in 3 months. Given condoms. Needs Hep A #2 at next visit.

## 2013-02-12 NOTE — Assessment & Plan Note (Signed)
Asx, states he is taking his meds.

## 2013-02-12 NOTE — Progress Notes (Signed)
   Subjective:    Patient ID: Max Ray, male    DOB: [birth year], 53 y.o.   MRN: [id number]  HPI 53 yo M with hx of HIV+, HTN and non-compliance. Is on complera. Is Hep B SAb+.  Today wants rx for nocturia, frequency and erectile dysfunction. Is wondering if he needs prostate exam (based on infomercial).  Wants viagra or cialis refill. Needs med assistance. Wants PSA.  Has had occas bumps on his butt that are sore and tender. Not pruritic. Wants to know if he needs valtrex.   No missed ART.   HIV 1 RNA Quant (copies/mL)  Date Value  11/15/2012 409*  11/01/2012 770*  12/13/2011 <20      CD4 T Cell Abs (/uL)  Date Value  11/01/2012 690   12/13/2011 770   06/21/2011 910     Review of Systems  Constitutional: Negative for appetite change and unexpected weight change.  HENT: Positive for rhinorrhea and sinus pressure.   Cardiovascular: Negative for chest pain.  Gastrointestinal: Negative for diarrhea and constipation.  Genitourinary: Positive for frequency. Negative for dysuria.  Neurological: Negative for headaches.       Objective:   Physical Exam  Constitutional: He appears well-developed and well-nourished.  Eyes: EOM are normal. Pupils are equal, round, and reactive to light.  Neck: Neck supple.  Cardiovascular: Normal rate, regular rhythm and normal heart sounds.   Pulmonary/Chest: Effort normal and breath sounds normal.  Abdominal: Soft. Bowel sounds are normal. There is no tenderness.  Genitourinary: Prostate normal. Rectal exam shows no external hemorrhoid, no internal hemorrhoid, no mass and anal tone normal.  Lymphadenopathy:    He has no cervical adenopathy.          Assessment & Plan:

## 2013-02-12 NOTE — Assessment & Plan Note (Signed)
Will refill his viagra. Check STD labs, he is sexually acitve with HIV+, using condoms.

## 2013-02-12 NOTE — Addendum Note (Signed)
Addended by: Caily Rakers C on: 02/12/2013 03:16 PM   Modules accepted: Orders

## 2013-02-13 LAB — T-HELPER CELL (CD4) - (RCID CLINIC ONLY)
CD4 T CELL ABS: 940 /uL (ref 400–2700)
CD4 T CELL HELPER: 33 % (ref 33–55)

## 2013-02-13 LAB — URINALYSIS, ROUTINE W REFLEX MICROSCOPIC
Bilirubin Urine: NEGATIVE
Glucose, UA: NEGATIVE mg/dL
Hgb urine dipstick: NEGATIVE
KETONES UR: NEGATIVE mg/dL
Leukocytes, UA: NEGATIVE
Nitrite: NEGATIVE
PH: 7 (ref 5.0–8.0)
Protein, ur: NEGATIVE mg/dL
SPECIFIC GRAVITY, URINE: 1.014 (ref 1.005–1.030)
Urobilinogen, UA: 1 mg/dL (ref 0.0–1.0)

## 2013-02-13 LAB — RPR

## 2013-02-13 LAB — PSA: PSA: 2.79 ng/mL (ref <=4.00)

## 2013-02-14 LAB — HIV-1 RNA ULTRAQUANT REFLEX TO GENTYP+: HIV 1 RNA Quant: <20 copies/mL (ref <20)

## 2013-02-21 ENCOUNTER — Ambulatory Visit: Payer: Self-pay | Admitting: *Deleted

## 2013-02-27 ENCOUNTER — Other Ambulatory Visit: Payer: Self-pay | Admitting: *Deleted

## 2013-02-27 DIAGNOSIS — B20 Human immunodeficiency virus [HIV] disease: Secondary | ICD-10-CM

## 2013-02-27 MED ORDER — EMTRICITAB-RILPIVIR-TENOFOV DF 200-25-300 MG PO TABS: 1.0000 | ORAL_TABLET | Freq: Every day | ORAL | Status: DC

## 2013-04-08 ENCOUNTER — Other Ambulatory Visit: Payer: Self-pay | Admitting: *Deleted

## 2013-04-08 MED ORDER — LISINOPRIL 20 MG PO TABS: ORAL_TABLET | ORAL | Status: DC

## 2013-05-03 ENCOUNTER — Other Ambulatory Visit: Payer: Self-pay | Admitting: Internal Medicine

## 2013-05-15 ENCOUNTER — Encounter: Payer: Self-pay | Admitting: Infectious Diseases

## 2013-05-15 ENCOUNTER — Other Ambulatory Visit: Payer: Self-pay | Admitting: *Deleted

## 2013-05-15 ENCOUNTER — Ambulatory Visit (INDEPENDENT_AMBULATORY_CARE_PROVIDER_SITE_OTHER): Payer: Medicare Other | Admitting: Infectious Diseases

## 2013-05-15 VITALS — BP 117/84 | HR 86 | Temp 98.6°F | Ht 71.0 in | Wt 187.0 lb

## 2013-05-15 DIAGNOSIS — Z23 Encounter for immunization: Secondary | ICD-10-CM

## 2013-05-15 DIAGNOSIS — B20 Human immunodeficiency virus [HIV] disease: Secondary | ICD-10-CM

## 2013-05-15 DIAGNOSIS — N289 Disorder of kidney and ureter, unspecified: Secondary | ICD-10-CM

## 2013-05-15 DIAGNOSIS — N529 Male erectile dysfunction, unspecified: Secondary | ICD-10-CM

## 2013-05-15 MED ORDER — TADALAFIL 20 MG PO TABS: 20.0000 mg | ORAL_TABLET | Freq: Every day | ORAL | Status: DC

## 2013-05-15 MED ORDER — LISINOPRIL 20 MG PO TABS: ORAL_TABLET | ORAL | Status: DC

## 2013-05-15 MED ORDER — EMTRICITAB-RILPIVIR-TENOFOV DF 200-25-300 MG PO TABS: ORAL_TABLET | ORAL | Status: DC

## 2013-05-15 NOTE — Assessment & Plan Note (Signed)
Will give him rx for cialis. Offered/provided condoms

## 2013-05-15 NOTE — Addendum Note (Signed)
Addended by: Myrtis Hopping A on: 05/15/2013 11:54 AM   Modules accepted: Orders

## 2013-05-15 NOTE — Progress Notes (Signed)
   Subjective:    Patient ID: Max Ray, male    DOB: 02/01/1960, 53 y.o.   MRN: [id number]  HPI 53 yo M with hx of HIV+, HTN and non-compliance. Is on complera. Is Hep B SAb+.  Has concerns about Erectile Dysfunction today. Had PSA and testosterone previously (2013), both normal.  No problems with art.  HIV 1 RNA Quant (copies/mL)  Date Value  02/12/2013 <20   11/15/2012 409*  11/01/2012 770*     CD4 T Cell Abs (/uL)  Date Value  02/12/2013 940   11/01/2012 690   12/13/2011 770      Review of Systems  Constitutional: Negative for appetite change and unexpected weight change.  Gastrointestinal: Negative for nausea and diarrhea.  Genitourinary: Negative for difficulty urinating.       Objective:   Physical Exam  Constitutional: He appears well-developed and well-nourished.  HENT:  Mouth/Throat: No oropharyngeal exudate.  Eyes: EOM are normal. Pupils are equal, round, and reactive to light.  Neck: Neck supple.  Cardiovascular: Normal rate, regular rhythm and normal heart sounds.   Lymphadenopathy:    He has no cervical adenopathy.          Assessment & Plan:

## 2013-05-15 NOTE — Assessment & Plan Note (Signed)
Cr improved at last blood draw. Will continue to monitor while on TFV.

## 2013-05-15 NOTE — Assessment & Plan Note (Signed)
Getting second hep A #2 today. Given condoms. Will continue his current meds. Will see him back in October, November. He is doing well. wil recheck his Hep B S Ab at next visit.

## 2013-07-04 ENCOUNTER — Ambulatory Visit: Payer: Medicare Other

## 2013-07-04 ENCOUNTER — Other Ambulatory Visit: Payer: Self-pay | Admitting: *Deleted

## 2013-07-04 DIAGNOSIS — B20 Human immunodeficiency virus [HIV] disease: Secondary | ICD-10-CM

## 2013-07-04 MED ORDER — EMTRICITAB-RILPIVIR-TENOFOV DF 200-25-300 MG PO TABS: ORAL_TABLET | ORAL | Status: DC

## 2013-08-17 ENCOUNTER — Other Ambulatory Visit: Payer: Self-pay | Admitting: Internal Medicine

## 2013-10-10 ENCOUNTER — Emergency Department (HOSPITAL_COMMUNITY): Payer: Medicare Other

## 2013-10-10 ENCOUNTER — Emergency Department (HOSPITAL_COMMUNITY)
Admission: EM | Admit: 2013-10-10 | Discharge: 2013-10-10 | Disposition: A | Discharge status: Home / Self Care | Payer: Medicare Other | Attending: Emergency Medicine | Admitting: Emergency Medicine

## 2013-10-10 ENCOUNTER — Encounter (HOSPITAL_COMMUNITY): Payer: Self-pay | Admitting: Emergency Medicine

## 2013-10-10 DIAGNOSIS — Z7951 Long term (current) use of inhaled steroids: Secondary | ICD-10-CM | POA: Diagnosis not present

## 2013-10-10 DIAGNOSIS — Z79899 Other long term (current) drug therapy: Secondary | ICD-10-CM | POA: Diagnosis not present

## 2013-10-10 DIAGNOSIS — Z23 Encounter for immunization: Secondary | ICD-10-CM | POA: Insufficient documentation

## 2013-10-10 DIAGNOSIS — Z21 Asymptomatic human immunodeficiency virus [HIV] infection status: Secondary | ICD-10-CM | POA: Diagnosis not present

## 2013-10-10 DIAGNOSIS — Z7982 Long term (current) use of aspirin: Secondary | ICD-10-CM | POA: Diagnosis not present

## 2013-10-10 DIAGNOSIS — W1789XA Other fall from one level to another, initial encounter: Secondary | ICD-10-CM | POA: Diagnosis not present

## 2013-10-10 DIAGNOSIS — I10 Essential (primary) hypertension: Secondary | ICD-10-CM | POA: Diagnosis not present

## 2013-10-10 DIAGNOSIS — S82122A Displaced fracture of lateral condyle of left tibia, initial encounter for closed fracture: Secondary | ICD-10-CM | POA: Insufficient documentation

## 2013-10-10 DIAGNOSIS — S0181XA Laceration without foreign body of other part of head, initial encounter: Secondary | ICD-10-CM | POA: Insufficient documentation

## 2013-10-10 DIAGNOSIS — S39012A Strain of muscle, fascia and tendon of lower back, initial encounter: Secondary | ICD-10-CM | POA: Insufficient documentation

## 2013-10-10 DIAGNOSIS — Z8619 Personal history of other infectious and parasitic diseases: Secondary | ICD-10-CM | POA: Diagnosis not present

## 2013-10-10 DIAGNOSIS — S82142A Displaced bicondylar fracture of left tibia, initial encounter for closed fracture: Secondary | ICD-10-CM

## 2013-10-10 DIAGNOSIS — Y9339 Activity, other involving climbing, rappelling and jumping off: Secondary | ICD-10-CM | POA: Insufficient documentation

## 2013-10-10 DIAGNOSIS — S8992XA Unspecified injury of left lower leg, initial encounter: Secondary | ICD-10-CM | POA: Diagnosis present

## 2013-10-10 DIAGNOSIS — Y92009 Unspecified place in unspecified non-institutional (private) residence as the place of occurrence of the external cause: Secondary | ICD-10-CM | POA: Insufficient documentation

## 2013-10-10 MED ORDER — OXYCODONE-ACETAMINOPHEN 5-325 MG PO TABS
2.0000 | ORAL_TABLET | Freq: Once | ORAL | Status: AC
  Administered 2013-10-10: 2 via ORAL
  Filled 2013-10-10: qty 2

## 2013-10-10 MED ORDER — CYCLOBENZAPRINE HCL 10 MG PO TABS: 10.0000 mg | ORAL_TABLET | Freq: Two times a day (BID) | ORAL | Status: DC | PRN

## 2013-10-10 MED ORDER — LIDOCAINE-EPINEPHRINE 2 %-1:100000 IJ SOLN
INTRAMUSCULAR | Status: AC
  Administered 2013-10-10: 1 mL
  Filled 2013-10-10: qty 1

## 2013-10-10 MED ORDER — OXYCODONE-ACETAMINOPHEN 5-325 MG PO TABS: 1.0000 | ORAL_TABLET | Freq: Four times a day (QID) | ORAL | Status: DC | PRN

## 2013-10-10 MED ORDER — TETANUS-DIPHTH-ACELL PERTUSSIS 5-2.5-18.5 LF-MCG/0.5 IM SUSP
0.5000 mL | Freq: Once | INTRAMUSCULAR | Status: AC
  Administered 2013-10-10: 0.5 mL via INTRAMUSCULAR
  Filled 2013-10-10: qty 0.5

## 2013-10-10 NOTE — ED Notes (Signed)
Bed: AN19 Expected date:  Expected time:  Means of arrival:  Comments: Jump from balcony, obvious deformity
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
## 2013-11-04 ENCOUNTER — Other Ambulatory Visit (HOSPITAL_COMMUNITY)
Admission: RE | Admit: 2013-11-04 | Discharge: 2013-11-04 | Disposition: A | Discharge status: Home / Self Care | Payer: Medicare Other | Source: Ambulatory Visit | Attending: Internal Medicine | Admitting: Internal Medicine

## 2013-11-04 ENCOUNTER — Other Ambulatory Visit: Payer: Medicare Other

## 2013-11-04 ENCOUNTER — Other Ambulatory Visit: Payer: Self-pay | Admitting: Licensed Clinical Social Worker

## 2013-11-04 DIAGNOSIS — B20 Human immunodeficiency virus [HIV] disease: Secondary | ICD-10-CM

## 2013-11-04 DIAGNOSIS — Z113 Encounter for screening for infections with a predominantly sexual mode of transmission: Secondary | ICD-10-CM | POA: Diagnosis present

## 2013-11-04 DIAGNOSIS — N289 Disorder of kidney and ureter, unspecified: Secondary | ICD-10-CM

## 2013-11-04 LAB — CBC WITH DIFFERENTIAL/PLATELET
BASOS ABS: 0 10*3/uL (ref 0.0–0.1)
Basophils Relative: 0 % (ref 0–1)
EOS ABS: 0.2 10*3/uL (ref 0.0–0.7)
Eosinophils Relative: 2 % (ref 0–5)
HCT: 44.7 % (ref 39.0–52.0)
Hemoglobin: 15.9 g/dL (ref 13.0–17.0)
Lymphocytes Relative: 29 % (ref 12–46)
Lymphs Abs: 2.4 10*3/uL (ref 0.7–4.0)
MCH: 33.5 pg (ref 26.0–34.0)
MCHC: 35.6 g/dL (ref 30.0–36.0)
MCV: 94.1 fL (ref 78.0–100.0)
Monocytes Absolute: 0.8 10*3/uL (ref 0.1–1.0)
Monocytes Relative: 10 % (ref 3–12)
NEUTROS PCT: 59 % (ref 43–77)
Neutro Abs: 5 10*3/uL (ref 1.7–7.7)
PLATELETS: 303 10*3/uL (ref 150–400)
RBC: 4.75 MIL/uL (ref 4.22–5.81)
RDW: 12.5 % (ref 11.5–15.5)
WBC: 8.4 10*3/uL (ref 4.0–10.5)

## 2013-11-04 LAB — COMPLETE METABOLIC PANEL WITH GFR
ALT: 25 U/L (ref 0–53)
AST: 25 U/L (ref 0–37)
Albumin: 4.6 g/dL (ref 3.5–5.2)
Alkaline Phosphatase: 103 U/L (ref 39–117)
BILIRUBIN TOTAL: 0.4 mg/dL (ref 0.2–1.2)
BUN: 9 mg/dL (ref 6–23)
CO2: 28 mEq/L (ref 19–32)
Calcium: 9.4 mg/dL (ref 8.4–10.5)
Chloride: 101 mEq/L (ref 96–112)
Creat: 1.29 mg/dL (ref 0.50–1.35)
GFR, EST AFRICAN AMERICAN: 73 mL/min
GFR, Est Non African American: 63 mL/min
Glucose, Bld: 76 mg/dL (ref 70–99)
Potassium: 4.2 mEq/L (ref 3.5–5.3)
SODIUM: 138 meq/L (ref 135–145)
Total Protein: 7.7 g/dL (ref 6.0–8.3)

## 2013-11-04 LAB — BASIC METABOLIC PANEL
BUN: 9 mg/dL (ref 6–23)
CHLORIDE: 101 meq/L (ref 96–112)
CO2: 28 mEq/L (ref 19–32)
CREATININE: 1.29 mg/dL (ref 0.50–1.35)
Calcium: 9.4 mg/dL (ref 8.4–10.5)
GLUCOSE: 76 mg/dL (ref 70–99)
Potassium: 4.2 mEq/L (ref 3.5–5.3)
Sodium: 138 mEq/L (ref 135–145)

## 2013-11-04 MED ORDER — EMTRICITAB-RILPIVIR-TENOFOV DF 200-25-300 MG PO TABS: 1.0000 | ORAL_TABLET | Freq: Every day | ORAL | Status: DC

## 2013-11-05 ENCOUNTER — Telehealth: Payer: Self-pay | Admitting: Licensed Clinical Social Worker

## 2013-11-05 LAB — RPR: RPR: REACTIVE — AB

## 2013-11-05 LAB — HIV-1 RNA QUANT-NO REFLEX-BLD
HIV 1 RNA QUANT: 62 {copies}/mL — AB (ref <20)
HIV-1 RNA Quant, Log: 1.79 {Log} — ABNORMAL HIGH (ref <1.30)

## 2013-11-05 LAB — URINE CYTOLOGY ANCILLARY ONLY
Chlamydia: NEGATIVE
NEISSERIA GONORRHEA: NEGATIVE

## 2013-11-05 LAB — HEPATITIS B SURFACE ANTIBODY,QUALITATIVE: HEP B S AB: POSITIVE — AB

## 2013-11-05 LAB — T-HELPER CELL (CD4) - (RCID CLINIC ONLY)
CD4 % Helper T Cell: 34 % (ref 33–55)
CD4 T CELL ABS: 920 /uL (ref 400–2700)

## 2013-11-05 LAB — RPR TITER: RPR Titer: 1:64 {titer} — AB

## 2013-11-05 LAB — FLUORESCENT TREPONEMAL AB(FTA)-IGG-BLD: FLUORESCENT TREPONEMAL ABS: REACTIVE — AB

## 2013-11-05 NOTE — Telephone Encounter (Signed)
-----   Message from Campbell Riches, MD sent at 11/05/2013 12:03 PM EST ----- Pt needs IM penicillin (he is not allergic) tx for syphillis, 2.4 million units, one injection only.    ----- Message -----    From: Lab in [facility] Interface    Sent: 11/04/2013  11:14 PM      To: Campbell Riches, MD

## 2013-11-05 NOTE — Telephone Encounter (Signed)
I tried to contact patient, but phone number listed is not accepting calls.

## 2013-11-06 ENCOUNTER — Ambulatory Visit (INDEPENDENT_AMBULATORY_CARE_PROVIDER_SITE_OTHER): Payer: Medicare Other | Admitting: *Deleted

## 2013-11-06 DIAGNOSIS — A539 Syphilis, unspecified: Secondary | ICD-10-CM

## 2013-11-06 MED ORDER — PENICILLIN G BENZATHINE 1200000 UNIT/2ML IM SUSP
1.2000 10*6.[IU] | Freq: Once | INTRAMUSCULAR | Status: AC
  Administered 2013-11-06: 1.2 10*6.[IU] via INTRAMUSCULAR

## 2013-11-06 NOTE — Telephone Encounter (Signed)
He called yesterday and was supposed to come in today.

## 2013-11-06 NOTE — Telephone Encounter (Signed)
Please ask [facility] to find pt

## 2013-11-18 ENCOUNTER — Telehealth: Payer: Self-pay | Admitting: *Deleted

## 2013-11-18 ENCOUNTER — Ambulatory Visit: Payer: Medicare Other | Admitting: Infectious Diseases

## 2013-11-18 NOTE — Telephone Encounter (Signed)
No show appt.  Unable to contact pt.

## 2013-11-27 ENCOUNTER — Other Ambulatory Visit: Payer: Self-pay | Admitting: *Deleted

## 2013-11-27 ENCOUNTER — Other Ambulatory Visit: Payer: Self-pay | Admitting: Internal Medicine

## 2013-11-27 DIAGNOSIS — B20 Human immunodeficiency virus [HIV] disease: Secondary | ICD-10-CM

## 2013-11-27 MED ORDER — EMTRICITAB-RILPIVIR-TENOFOV DF 200-25-300 MG PO TABS: 1.0000 | ORAL_TABLET | Freq: Every day | ORAL | Status: DC

## 2014-02-13 ENCOUNTER — Other Ambulatory Visit: Payer: Self-pay | Admitting: Infectious Diseases

## 2014-02-13 ENCOUNTER — Other Ambulatory Visit (HOSPITAL_COMMUNITY)
Admission: RE | Admit: 2014-02-13 | Discharge: 2014-02-13 | Disposition: A | Discharge status: Home / Self Care | Payer: Medicare Other | Source: Ambulatory Visit | Attending: Infectious Diseases | Admitting: Infectious Diseases

## 2014-02-13 ENCOUNTER — Ambulatory Visit: Payer: Medicare Other

## 2014-02-13 ENCOUNTER — Other Ambulatory Visit: Payer: Medicare Other

## 2014-02-13 DIAGNOSIS — Z113 Encounter for screening for infections with a predominantly sexual mode of transmission: Secondary | ICD-10-CM

## 2014-02-13 DIAGNOSIS — B20 Human immunodeficiency virus [HIV] disease: Secondary | ICD-10-CM

## 2014-02-13 DIAGNOSIS — Z79899 Other long term (current) drug therapy: Secondary | ICD-10-CM

## 2014-02-14 ENCOUNTER — Other Ambulatory Visit: Payer: Self-pay | Admitting: *Deleted

## 2014-02-14 DIAGNOSIS — B20 Human immunodeficiency virus [HIV] disease: Secondary | ICD-10-CM

## 2014-02-14 LAB — COMPREHENSIVE METABOLIC PANEL
ALBUMIN: 4.5 g/dL (ref 3.5–5.2)
ALT: 19 U/L (ref 0–53)
AST: 27 U/L (ref 0–37)
Alkaline Phosphatase: 62 U/L (ref 39–117)
BUN: 8 mg/dL (ref 6–23)
CO2: 28 mEq/L (ref 19–32)
CREATININE: 1.17 mg/dL (ref 0.50–1.35)
Calcium: 9.7 mg/dL (ref 8.4–10.5)
Chloride: 102 mEq/L (ref 96–112)
GLUCOSE: 82 mg/dL (ref 70–99)
Potassium: 3.8 mEq/L (ref 3.5–5.3)
Sodium: 140 mEq/L (ref 135–145)
Total Bilirubin: 1.6 mg/dL — ABNORMAL HIGH (ref 0.2–1.2)
Total Protein: 7.8 g/dL (ref 6.0–8.3)

## 2014-02-14 LAB — CBC WITH DIFFERENTIAL/PLATELET
BASOS PCT: 0 % (ref 0–1)
Basophils Absolute: 0 10*3/uL (ref 0.0–0.1)
Eosinophils Absolute: 0.2 10*3/uL (ref 0.0–0.7)
Eosinophils Relative: 2 % (ref 0–5)
HEMATOCRIT: 42.7 % (ref 39.0–52.0)
Hemoglobin: 14.6 g/dL (ref 13.0–17.0)
LYMPHS ABS: 3.1 10*3/uL (ref 0.7–4.0)
LYMPHS PCT: 26 % (ref 12–46)
MCH: 33.6 pg (ref 26.0–34.0)
MCHC: 34.2 g/dL (ref 30.0–36.0)
MCV: 98.4 fL (ref 78.0–100.0)
MPV: 10.1 fL (ref 8.6–12.4)
Monocytes Absolute: 1.2 10*3/uL — ABNORMAL HIGH (ref 0.1–1.0)
Monocytes Relative: 10 % (ref 3–12)
NEUTROS ABS: 7.4 10*3/uL (ref 1.7–7.7)
NEUTROS PCT: 62 % (ref 43–77)
Platelets: 246 10*3/uL (ref 150–400)
RBC: 4.34 MIL/uL (ref 4.22–5.81)
RDW: 13.6 % (ref 11.5–15.5)
WBC: 12 10*3/uL — ABNORMAL HIGH (ref 4.0–10.5)

## 2014-02-14 LAB — HIV-1 RNA QUANT-NO REFLEX-BLD: HIV-1 RNA Quant, Log: <1.3 {Log} (ref <1.30)

## 2014-02-14 LAB — LIPID PANEL
CHOLESTEROL: 149 mg/dL (ref 0–200)
HDL: 48 mg/dL (ref >39)
LDL CALC: 79 mg/dL (ref 0–99)
Total CHOL/HDL Ratio: 3.1 Ratio
Triglycerides: 112 mg/dL (ref <150)
VLDL: 22 mg/dL (ref 0–40)

## 2014-02-14 LAB — URINE CYTOLOGY ANCILLARY ONLY
Chlamydia: NEGATIVE
NEISSERIA GONORRHEA: NEGATIVE

## 2014-02-14 LAB — T-HELPER CELL (CD4) - (RCID CLINIC ONLY)
CD4 T CELL HELPER: 35 % (ref 33–55)
CD4 T Cell Abs: 1060 /uL (ref 400–2700)

## 2014-02-14 LAB — RPR

## 2014-02-14 MED ORDER — EMTRICITAB-RILPIVIR-TENOFOV DF 200-25-300 MG PO TABS
1.0000 | ORAL_TABLET | Freq: Every day | ORAL | Status: DC
Start: 2014-02-14 — End: 2014-11-04

## 2014-02-14 NOTE — Telephone Encounter (Signed)
ADAP Application 

## 2014-02-26 ENCOUNTER — Ambulatory Visit: Payer: Self-pay | Admitting: Infectious Diseases

## 2014-04-08 ENCOUNTER — Ambulatory Visit: Payer: Medicare Other | Admitting: Infectious Diseases

## 2014-04-08 ENCOUNTER — Encounter: Payer: Self-pay | Admitting: Infectious Diseases

## 2014-04-08 ENCOUNTER — Other Ambulatory Visit (HOSPITAL_COMMUNITY)
Admission: RE | Admit: 2014-04-08 | Discharge: 2014-04-08 | Disposition: A | Discharge status: Home / Self Care | Payer: Medicare Other | Source: Ambulatory Visit | Attending: Infectious Diseases | Admitting: Infectious Diseases

## 2014-04-08 ENCOUNTER — Ambulatory Visit (INDEPENDENT_AMBULATORY_CARE_PROVIDER_SITE_OTHER): Payer: Medicare Other | Admitting: Infectious Diseases

## 2014-04-08 VITALS — BP 153/107 | HR 101 | Temp 98.3°F | Wt 207.8 lb

## 2014-04-08 DIAGNOSIS — Z113 Encounter for screening for infections with a predominantly sexual mode of transmission: Secondary | ICD-10-CM

## 2014-04-08 DIAGNOSIS — J328 Other chronic sinusitis: Secondary | ICD-10-CM

## 2014-04-08 DIAGNOSIS — K589 Irritable bowel syndrome without diarrhea: Secondary | ICD-10-CM

## 2014-04-08 DIAGNOSIS — J321 Chronic frontal sinusitis: Secondary | ICD-10-CM | POA: Diagnosis not present

## 2014-04-08 DIAGNOSIS — I10 Essential (primary) hypertension: Secondary | ICD-10-CM

## 2014-04-08 DIAGNOSIS — J329 Chronic sinusitis, unspecified: Secondary | ICD-10-CM | POA: Insufficient documentation

## 2014-04-08 DIAGNOSIS — B20 Human immunodeficiency virus [HIV] disease: Secondary | ICD-10-CM

## 2014-04-08 LAB — RPR: RPR: REACTIVE — AB

## 2014-04-08 LAB — RPR TITER: RPR Titer: 1:4 {titer}

## 2014-04-08 MED ORDER — AZITHROMYCIN 250 MG PO TABS: ORAL_TABLET | ORAL | Status: DC

## 2014-04-08 NOTE — Assessment & Plan Note (Signed)
Will have him f/u with his PCP

## 2014-04-08 NOTE — Assessment & Plan Note (Signed)
He is doing well on complera.  Will screen him for stds.  He is given condoms.  Will see him back in 6 months.

## 2014-04-08 NOTE — Progress Notes (Signed)
   Subjective:    Patient ID: Max Ray, male    DOB: 1960/09/04, 54 y.o.   MRN: [id number]  HPI 54 yo M with hx of HIV+, HTN and non-compliance. Is on complera. Is Hep B SAb+.  HIV 1 RNA QUANT (copies/mL)  Date Value  02/13/2014 <20  11/04/2013 62*  02/12/2013 <20   CD4 T CELL ABS (/uL)  Date Value  02/13/2014 1060  11/04/2013 920  02/12/2013 940   Believes he has IBS: gas, bloating, blood in stool, loss of appetite. Has to make himself eat. Would like to be seen by GI.  Believes he has sinus infection- no relief with pseudofed, flonase without relief. Having sinus pressure headaches.  Having unprotected sex.   Review of Systems  Constitutional: Negative for appetite change and unexpected weight change.  Gastrointestinal: Positive for diarrhea and blood in stool.  Genitourinary: Negative for difficulty urinating.  Neurological: Positive for headaches.      Objective:   Physical Exam  Constitutional: He appears well-developed and well-nourished.  HENT:  Mouth/Throat: No oropharyngeal exudate.  Eyes: EOM are normal. Pupils are equal, round, and reactive to light.  Neck: Neck supple.  Cardiovascular: Normal rate, regular rhythm and normal heart sounds.   Pulmonary/Chest: Effort normal and breath sounds normal.  Abdominal: Soft. Bowel sounds are normal. There is no tenderness.  Lymphadenopathy:    He has no cervical adenopathy.       Assessment & Plan:

## 2014-04-08 NOTE — Assessment & Plan Note (Signed)
Encouraged him to only use flonase for 1 week, as it can interact with his ART.  Will give him z-pack.

## 2014-04-08 NOTE — Assessment & Plan Note (Signed)
Will have him seen by GI. Had colon in 2012.

## 2014-04-08 NOTE — Addendum Note (Signed)
Addended by: Delmy Holdren C on: 04/08/2014 03:43 PM   Modules accepted: Orders

## 2014-04-09 ENCOUNTER — Encounter: Payer: Self-pay | Admitting: Gastroenterology

## 2014-04-09 LAB — URINE CYTOLOGY ANCILLARY ONLY
Chlamydia: NEGATIVE
NEISSERIA GONORRHEA: NEGATIVE

## 2014-04-09 LAB — FLUORESCENT TREPONEMAL AB(FTA)-IGG-BLD: Fluorescent Treponemal ABS: REACTIVE — AB

## 2014-04-13 ENCOUNTER — Encounter (HOSPITAL_COMMUNITY): Payer: Self-pay | Admitting: Emergency Medicine

## 2014-04-13 ENCOUNTER — Emergency Department (HOSPITAL_COMMUNITY): Payer: Medicare Other

## 2014-04-13 ENCOUNTER — Emergency Department (HOSPITAL_COMMUNITY)
Admission: EM | Admit: 2014-04-13 | Discharge: 2014-04-14 | Disposition: A | Discharge status: Home / Self Care | Payer: Medicare Other | Attending: Emergency Medicine | Admitting: Emergency Medicine

## 2014-04-13 DIAGNOSIS — K219 Gastro-esophageal reflux disease without esophagitis: Secondary | ICD-10-CM | POA: Diagnosis not present

## 2014-04-13 DIAGNOSIS — Z8619 Personal history of other infectious and parasitic diseases: Secondary | ICD-10-CM | POA: Insufficient documentation

## 2014-04-13 DIAGNOSIS — K2970 Gastritis, unspecified, without bleeding: Secondary | ICD-10-CM

## 2014-04-13 DIAGNOSIS — R112 Nausea with vomiting, unspecified: Secondary | ICD-10-CM

## 2014-04-13 DIAGNOSIS — R0602 Shortness of breath: Secondary | ICD-10-CM

## 2014-04-13 DIAGNOSIS — Z21 Asymptomatic human immunodeficiency virus [HIV] infection status: Secondary | ICD-10-CM | POA: Insufficient documentation

## 2014-04-13 DIAGNOSIS — Z79899 Other long term (current) drug therapy: Secondary | ICD-10-CM | POA: Diagnosis not present

## 2014-04-13 DIAGNOSIS — I10 Essential (primary) hypertension: Secondary | ICD-10-CM | POA: Diagnosis not present

## 2014-04-13 DIAGNOSIS — R1013 Epigastric pain: Secondary | ICD-10-CM | POA: Diagnosis present

## 2014-04-13 DIAGNOSIS — K648 Other hemorrhoids: Secondary | ICD-10-CM | POA: Diagnosis not present

## 2014-04-13 DIAGNOSIS — R109 Unspecified abdominal pain: Secondary | ICD-10-CM

## 2014-04-13 LAB — COMPREHENSIVE METABOLIC PANEL
ALT: 40 U/L (ref 0–53)
ANION GAP: 8 (ref 5–15)
AST: 71 U/L — ABNORMAL HIGH (ref 0–37)
Albumin: 3.6 g/dL (ref 3.5–5.2)
Alkaline Phosphatase: 73 U/L (ref 39–117)
BUN: 11 mg/dL (ref 6–23)
CHLORIDE: 101 mmol/L (ref 96–112)
CO2: 27 mmol/L (ref 19–32)
CREATININE: 1.3 mg/dL (ref 0.50–1.35)
Calcium: 9.1 mg/dL (ref 8.4–10.5)
GFR calc Af Amer: 71 mL/min — ABNORMAL LOW (ref >90)
GFR calc non Af Amer: 61 mL/min — ABNORMAL LOW (ref >90)
GLUCOSE: 111 mg/dL — AB (ref 70–99)
Potassium: 3.7 mmol/L (ref 3.5–5.1)
Sodium: 136 mmol/L (ref 135–145)
TOTAL PROTEIN: 8.1 g/dL (ref 6.0–8.3)
Total Bilirubin: 0.3 mg/dL (ref 0.3–1.2)

## 2014-04-13 LAB — CBC WITH DIFFERENTIAL/PLATELET
BASOS ABS: 0 10*3/uL (ref 0.0–0.1)
Basophils Relative: 0 % (ref 0–1)
EOS ABS: 0.5 10*3/uL (ref 0.0–0.7)
EOS PCT: 4 % (ref 0–5)
HEMATOCRIT: 41.3 % (ref 39.0–52.0)
Hemoglobin: 13.8 g/dL (ref 13.0–17.0)
LYMPHS ABS: 2.9 10*3/uL (ref 0.7–4.0)
Lymphocytes Relative: 22 % (ref 12–46)
MCH: 31.7 pg (ref 26.0–34.0)
MCHC: 33.4 g/dL (ref 30.0–36.0)
MCV: 94.9 fL (ref 78.0–100.0)
MONOS PCT: 8 % (ref 3–12)
Monocytes Absolute: 1.1 10*3/uL — ABNORMAL HIGH (ref 0.1–1.0)
NEUTROS ABS: 8.8 10*3/uL — AB (ref 1.7–7.7)
NEUTROS PCT: 66 % (ref 43–77)
PLATELETS: 521 10*3/uL — AB (ref 150–400)
RBC: 4.35 MIL/uL (ref 4.22–5.81)
RDW: 12.6 % (ref 11.5–15.5)
WBC: 13.4 10*3/uL — AB (ref 4.0–10.5)

## 2014-04-13 LAB — LIPASE, BLOOD: Lipase: 32 U/L (ref 11–59)

## 2014-04-13 LAB — I-STAT TROPONIN, ED: Troponin i, poc: 0 ng/mL (ref 0.00–0.08)

## 2014-04-13 LAB — POC OCCULT BLOOD, ED: Fecal Occult Bld: POSITIVE — AB

## 2014-04-13 MED ORDER — FAMOTIDINE IN NACL 20-0.9 MG/50ML-% IV SOLN
20.0000 mg | Freq: Once | INTRAVENOUS | Status: AC
  Administered 2014-04-13: 20 mg via INTRAVENOUS
  Filled 2014-04-13: qty 50

## 2014-04-13 MED ORDER — ONDANSETRON HCL 4 MG/2ML IJ SOLN
4.0000 mg | Freq: Once | INTRAMUSCULAR | Status: AC
  Administered 2014-04-13: 4 mg via INTRAVENOUS
  Filled 2014-04-13: qty 2

## 2014-04-13 MED ORDER — GI COCKTAIL ~~LOC~~
30.0000 mL | Freq: Once | ORAL | Status: DC
  Filled 2014-04-13: qty 30

## 2014-04-13 MED ORDER — FAMOTIDINE 20 MG PO TABS
40.0000 mg | ORAL_TABLET | Freq: Once | ORAL | Status: DC
  Filled 2014-04-13: qty 2

## 2014-04-13 NOTE — ED Provider Notes (Signed)
Medical screening examination/treatment/procedure(s) were conducted as a shared visit with non-physician practitioner(s) and myself.  I personally evaluated the patient during the encounter.   EKG Interpretation   Date/Time:  Sunday April 13 2014 20:25:45 EDT Ventricular Rate:  81 PR Interval:  150 QRS Duration: 84 QT Interval:  316 QTC Calculation: 367 R Axis:   -35 Text Interpretation:  Normal sinus rhythm Left axis deviation Minimal  voltage criteria for LVH, may be normal variant Possible Lateral infarct ,  age undetermined Abnormal ECG Confirmed by Kierre Deines  MD, Ivi Griffith ([id number]) on  04/13/2014 9:33:37 PM     Pt here with epigastric abd pain that started after he ate a steak sub. No anginal quality to his sx--ecg without ischemic changes, will tx for gerd  Lacretia Leigh, MD 04/13/14 2154

## 2014-04-13 NOTE — ED Notes (Signed)
Pt c/o bloody stools and abdominal pain ongoing for several weeks. Pt has been taking colon probiotic for past 4 days. Has apt with GI on April 25. Pt here tonight because pain is worse making it difficult for him to breathe.

## 2014-04-13 NOTE — ED Provider Notes (Signed)
CSN: [id number]     Arrival date & time 04/13/14  2013 History   First MD Initiated Contact with Patient 04/13/14 2037     Chief Complaint  Patient presents with  . Abdominal Pain     (Consider location/radiation/quality/duration/timing/severity/associated sxs/prior Treatment) HPI Comments: Max Ray is a 54 y.o. male with a PMHx of HIV, syphilis, and HTN, who presents to the ED with complaints of 2.5 weeks of intermittent abdominal pain and bloating associated with meals, accompanied with shortness of breath and painless hematochezia. He has been seen by his primary care doctor 5 days ago and he was referred to a GI specialist which he has an appointment with on 4/25. He states that he thinks he has IBS and has started taking probiotics to help with his symptoms. He states that tonight approximately 2 hours prior to arrival he was eating a slice of pizza, sub-sandwich, and Coca[employer] soda and had recurrence of his abdominal bloating which she described as a 5/10 pressure fullness in his epigastrium, which temporarily radiated to his back, with constant but has now improved after taking ibuprofen, and worsened with movement. He had associated nausea, lightheadedness, diaphoresis, and shortness of breath which have all resolved at this time. He describes his hematochezia is bright red but painless, noting it as streaks of blood on the stool and on the toilet paper when he wipes, and states that this has been going on for 2.5wks and is unchanged today. He denies any fevers, chills, cough, hemoptysis, wheezing, chest pain, leg swelling, recent travel/surgery/immobilization, PMHx or FHx of DVT/PE, vomiting, diarrhea, constipation, obstipation, rectal pain, melena, dysuria, hematuria, flank pain, penile discharge, testicular pain or swelling, numbness, tingling, weakness, rashes, arthralgias, myalgias, sick contacts, suspicious food intake, or alcohol use. He was recently on azithromycin for 5 days for a  sinus infection.  Patient is a 54 y.o. male presenting with abdominal pain. The history is provided by the patient. No language interpreter was used.  Abdominal Pain Pain location:  Epigastric Pain quality: fullness and pressure   Pain radiates to:  Back Pain severity:  Moderate Onset quality:  Gradual Duration:  2 hours (has had it 2.5wks but had another episode 2hrs ago) Timing:  Intermittent Progression:  Resolved Chronicity:  New Context: eating (pizza, sub sandwich, and coca cola)   Context: not recent illness, not recent travel, not sick contacts and not suspicious food intake   Relieved by:  NSAIDs Worsened by:  Movement Ineffective treatments:  None tried Associated symptoms: hematochezia, nausea and shortness of breath (now resolved)   Associated symptoms: no belching, no chest pain, no chills, no constipation, no cough, no diarrhea, no dysuria, no fever, no flatus, no hematemesis, no hematuria, no melena and no vomiting     Past Medical History  Diagnosis Date  . HIV disease   . Syphilis   . Hypertension    History reviewed. No pertinent past surgical history. No family history on file. History  Substance Use Topics  . Smoking status: Never Smoker   . Smokeless tobacco: Never Used  . Alcohol Use: 0.6 oz/week    1 Standard drinks or equivalent per week    Review of Systems  Constitutional: Positive for diaphoresis. Negative for fever and chills.  Respiratory: Positive for shortness of breath (now resolved). Negative for cough and wheezing.   Cardiovascular: Negative for chest pain and leg swelling.  Gastrointestinal: Positive for nausea, abdominal pain (epigastric), blood in stool (painless) and hematochezia. Negative for vomiting, diarrhea, constipation, melena,  rectal pain, flatus and hematemesis.  Genitourinary: Negative for dysuria, hematuria, flank pain, scrotal swelling, penile pain and testicular pain.  Musculoskeletal: Negative for myalgias and  arthralgias.  Skin: Negative for rash.  Allergic/Immunologic: Positive for immunocompromised state (HIV+).  Neurological: Positive for light-headedness (now resolved). Negative for dizziness, weakness and numbness.  Psychiatric/Behavioral: Negative for confusion.   10 Systems reviewed and are negative for acute change except as noted in the HPI.    Allergies  Sulfamethoxazole-trimethoprim and Sulfonamide derivatives  Home Medications   Prior to Admission medications   Medication Sig Start Date End Date Taking? Authorizing Provider  azithromycin (ZITHROMAX Z-PAK) 250 MG tablet 2 tab po on day 1 then 1 tab po on days 2-5 04/08/14   Campbell Riches, MD  cyclobenzaprine (FLEXERIL) 10 MG tablet Take 1 tablet (10 mg total) by mouth 2 (two) times daily as needed for muscle spasms. 10/10/13   Blanchie Dessert, MD  Emtricitab-Rilpivir-Tenofovir (COMPLERA) 200-25-300 MG TABS Take 1 tablet by mouth daily. 02/14/14   Campbell Riches, MD  lisinopril (PRINIVIL,ZESTRIL) 20 MG tablet TAKE ONE (1) TABLET EACH DAY 05/15/13   Campbell Riches, MD  oxyCODONE-acetaminophen (PERCOCET/ROXICET) 5-325 MG per tablet Take 1-2 tablets by mouth every 6 (six) hours as needed for severe pain. 10/10/13   Blanchie Dessert, MD   BP 145/96 mmHg  Pulse 94  Temp(Src) 98.2 F (36.8 C)  Resp 16  Ht 5\' 10"  (1.778 m)  Wt 200 lb (90.719 kg)  BMI 28.70 kg/m2  SpO2 99% Physical Exam  Constitutional: He is oriented to person, place, and time. Vital signs are normal. He appears well-developed and well-nourished.  Non-toxic appearance. No distress.  Afebrile, nontoxic, NAD, VSS  HENT:  Head: Normocephalic and atraumatic.  Mouth/Throat: Oropharynx is clear and moist and mucous membranes are normal.  Eyes: Conjunctivae and EOM are normal. Right eye exhibits no discharge. Left eye exhibits no discharge.  Neck: Normal range of motion. Neck supple. No JVD present.  No JVD  Cardiovascular: Normal rate, regular rhythm, normal  heart sounds and intact distal pulses.  Exam reveals no gallop and no friction rub.   No murmur heard. RRR, nl s1/s2, no m/r/g, distal pulses intact, no pedal edema   Pulmonary/Chest: Effort normal and breath sounds normal. No respiratory distress. He has no decreased breath sounds. He has no wheezes. He has no rhonchi. He has no rales.  CTAB in all lung fields, no w/r/r, no hypoxia or increased WOB, speaking in full sentences, SpO2 98% on RA   Abdominal: Soft. Normal appearance and bowel sounds are normal. He exhibits no distension. There is tenderness in the epigastric area. There is no rigidity, no rebound, no guarding, no CVA tenderness, no tenderness at McBurney's point and negative Murphy's sign.    Soft, nondistended, +BS throughout, with mild epigastric discomfort which he states feels "full" but not tender, no r/g/r, neg murphy's, neg mcburney's, no CVA TTP   Genitourinary: Prostate normal. Rectal exam shows internal hemorrhoid. Rectal exam shows no external hemorrhoid, no fissure, no mass, no tenderness and anal tone normal. Guaiac positive stool.  Chaperone present No gross blood noted on rectal exam, normal tone, no tenderness, no mass or fissure, no external hemorrhoids, palpable internal hemorrhoids which are nonTTP. Prostate without enlargement or tenderness. No fecal impaction. FOBT card positive.  Musculoskeletal: Normal range of motion.  MAE x4 Strength and sensation grossly intact Distal pulses intact No pedal edema, neg homan's bilaterally   Neurological: He is alert and oriented  to person, place, and time. He has normal strength. No sensory deficit. Gait normal.  Skin: Skin is warm, dry and intact. No rash noted.  Psychiatric: He has a normal mood and affect.  Nursing note and vitals reviewed.   ED Course  Procedures (including critical care time) Labs Review Labs Reviewed  CBC WITH DIFFERENTIAL/PLATELET - Abnormal; Notable for the following:    WBC 13.4 (*)     Platelets 521 (*)    Neutro Abs 8.8 (*)    Monocytes Absolute 1.1 (*)    All other components within normal limits  COMPREHENSIVE METABOLIC PANEL - Abnormal; Notable for the following:    Glucose, Bld 111 (*)    AST 71 (*)    GFR calc non Af Amer 61 (*)    GFR calc Af Amer 71 (*)    All other components within normal limits  POC OCCULT BLOOD, ED - Abnormal; Notable for the following:    Fecal Occult Bld POSITIVE (*)    All other components within normal limits  LIPASE, BLOOD  I-STAT TROPOININ, ED    Imaging Review Dg Abd Acute W/chest  04/14/2014   CLINICAL DATA:  Shortness of breath, abdominal bloating and pain. Symptoms for 1 day.  EXAM: DG ABDOMEN ACUTE W/ 1V CHEST  COMPARISON:  None.  FINDINGS: The cardiomediastinal contours are normal. The lungs are clear. There is no free intra-abdominal air. No dilated bowel loops to suggest obstruction. Small volume of stool throughout the colon. Small air-fluid level in the stomach. No radiopaque calculi. No acute osseous abnormalities are seen. Lower pelvis not included in the field of view.  IMPRESSION: 1. Normal bowel gas pattern.  No free intra-abdominal air. 2. Clear lungs.   Electronically Signed   By: Jeb Levering M.D.   On: 04/14/2014 00:09     EKG Interpretation   Date/Time:  Sunday April 13 2014 20:25:45 EDT Ventricular Rate:  81 PR Interval:  150 QRS Duration: 84 QT Interval:  316 QTC Calculation: 367 R Axis:   -35 Text Interpretation:  Normal sinus rhythm Left axis deviation Minimal  voltage criteria for LVH, may be normal variant Possible Lateral infarct ,  age undetermined Abnormal ECG Confirmed by Zenia Resides  MD, ANTHONY ([id number]) on  04/13/2014 9:33:37 PM      MDM   Final diagnoses:  SOB (shortness of breath)  Abdominal pain  Gastritis  Non-intractable vomiting with nausea, vomiting of unspecified type  Internal hemorrhoid, bleeding  Gastroesophageal reflux disease, esophagitis presence not specified    54 y.o.  male here with painless hematochezia, and intermittent abd pain with SOB x2.5wks associated with eating. Had another episode tonight after eating a slice of pizza, sub sandwich, and a coca cola. States he felt SOB and epigastric pain like he needed to belch. Took an ibuprofen and all of his symptoms resolved PTA. No ongoing symptoms now. Abd exam reveals mild epigastric discomfort, states he feels "full". Likely gastritis vs PUD from chronic NSAID use. Rectal exam reveals internal hemorrhoids. Will obtain basic labs, EKG, acute abd series to r/o cardiopulmonary process or obstruction/perf. Doubt DVT/PE given low risk and no hypoxia/tachycardia/leg swelling or CP complaint. Pt having no symptoms therefore declines pain meds or nausea meds. Will reassess shortly. Has GI f/up on 4/25. Will likely send home with anusol.   10:16 PM After initial evaluation, pt vomited. Will proceed with IV and zofran/pepcid. FOBT positive, which would be consistent with his internal hemorrhoids. Trop WNL. CBC w/diff showing elevated WBC  at 13.4 likely from stress response. CMP showing mildly elevated AST which has been seen previously. Lipase WNL. EKG similar to prior. Xray imaging pending.   12:24 AM Acute abd series without acute findings, small volume stool in colon. Patient feeling improved after he vomited, and has had no further emesis. No ongoing nausea. Has slight epigastric pain but much improved from arrival. Tolerating PO well here. Will send home with prilosec, zofran, and anusol. Instructed on dietary modifications and lifestyle modifications for GERD/PUD. Will have him f/up with GI specialist at his appt on 4/25. I explained the diagnosis and have given explicit precautions to return to the ER including for any other new or worsening symptoms. The patient understands and accepts the medical plan as it's been dictated and I have answered their questions. Discharge instructions concerning home care and prescriptions  have been given. The patient is STABLE and is discharged to home in good condition.  BP 147/92 mmHg  Pulse 88  Temp(Src) 98.2 F (36.8 C)  Resp 18  Ht 5\' 10"  (1.778 m)  Wt 200 lb (90.719 kg)  BMI 28.70 kg/m2  SpO2 97%  Meds ordered this encounter  Medications  . gi cocktail (Maalox,Lidocaine,Donnatal)    Sig:   . ondansetron (ZOFRAN) injection 4 mg    Sig:   . famotidine (PEPCID) IVPB 20 mg    Sig:   . omeprazole (PRILOSEC) 20 MG capsule    Sig: Take 1 capsule (20 mg total) by mouth daily.    Dispense:  30 capsule    Refill:  0    Order Specific Question:  Supervising Provider    Answer:  MILLER, BRIAN [[id number]]  . ondansetron (ZOFRAN) 8 MG tablet    Sig: Take 1 tablet (8 mg total) by mouth every 8 (eight) hours as needed for nausea or vomiting.    Dispense:  10 tablet    Refill:  0    Order Specific Question:  Supervising Provider    Answer:  Sabra Heck, BRIAN [[id number]]  . hydrocortisone (ANUSOL-HC) 25 MG suppository    Sig: Place 1 suppository (25 mg total) rectally 2 (two) times daily. For 7 days    Dispense:  14 suppository    Refill:  0    Order Specific Question:  Supervising Provider    Answer:  Noemi Chapel [[id number]]     Max Venard Camprubi-Soms, PA-C 04/14/14 [phone]

## 2014-04-13 NOTE — ED Notes (Signed)
Pt has active vomiting, [name] and Dr. Zenia Resides made aware.

## 2014-04-13 NOTE — ED Notes (Signed)
Spoke with Mariann Laster from lab and she states lab is backed up on the chemistry side.

## 2014-04-14 MED ORDER — OMEPRAZOLE 20 MG PO CPDR: 20.0000 mg | DELAYED_RELEASE_CAPSULE | Freq: Every day | ORAL | Status: DC

## 2014-04-14 MED ORDER — HYDROCORTISONE ACETATE 25 MG RE SUPP: 25.0000 mg | Freq: Two times a day (BID) | RECTAL | Status: DC

## 2014-04-14 MED ORDER — ONDANSETRON HCL 8 MG PO TABS: 8.0000 mg | ORAL_TABLET | Freq: Three times a day (TID) | ORAL | Status: DC | PRN

## 2014-04-14 NOTE — Discharge Instructions (Signed)
Your work up did not reveal an acute emergent cause of your symptoms, but you likely have gastritis or possibly an ulcer in your stomach. Start taking prilosec as directed. Avoid spicy/fatty foods, don't lay down until at least 30 minutes after meals, and avoid NSAIDs like ibuprofen or aleve (if you need these, always take them on a full stomach). Use tylenol for pain as needed. Stay well hydrated. For your hemorrhoids, use anusol as directed. For all of your symptoms, you will need to follow up with the gastroenterologist at your appointment on 04/28/14. For any changes or worsening in symptoms, return to the ER.    Abdominal (belly) pain can be caused by many things. Your caregiver performed an examination and possibly ordered blood/urine tests and imaging (CT scan, x-rays, ultrasound). Many cases can be observed and treated at home after initial evaluation in the emergency department. Even though you are being discharged home, abdominal pain can be unpredictable. Therefore, you need a repeated exam if your pain does not resolve, returns, or worsens. Most patients with abdominal pain don't have to be admitted to the hospital or have surgery, but serious problems like appendicitis and gallbladder attacks can start out as nonspecific pain. Many abdominal conditions cannot be diagnosed in one visit, so follow-up evaluations are very important. SEEK IMMEDIATE MEDICAL ATTENTION IF YOU DEVELOP ANY OF THE FOLLOWING SYMPTOMS:  The pain does not go away or becomes severe.   A temperature above 101 develops.   Repeated vomiting occurs (multiple episodes).   The pain becomes localized to portions of the abdomen. The right side could possibly be appendicitis. In an adult, the left lower portion of the abdomen could be colitis or diverticulitis.   Blood is being passed in stools or vomit (bright red or black tarry stools).   Return also if you develop chest pain, difficulty breathing, dizziness or fainting, or  become confused, poorly responsive, or inconsolable (young children).  The constipation stays for more than 4 days.   There is belly (abdominal) or rectal pain.   You do not seem to be getting better.     Abdominal Pain Many things can cause belly (abdominal) pain. Most times, the belly pain is not dangerous. Many cases of belly pain can be watched and treated at home. HOME CARE   Do not take medicines that help you go poop (laxatives) unless told to by your doctor.  Only take medicine as told by your doctor.  Eat or drink as told by your doctor. Your doctor will tell you if you should be on a special diet. GET HELP IF:  You do not know what is causing your belly pain.  You have belly pain while you are sick to your stomach (nauseous) or have runny poop (diarrhea).  You have pain while you pee or poop.  Your belly pain wakes you up at night.  You have belly pain that gets worse or better when you eat.  You have belly pain that gets worse when you eat fatty foods.  You have a fever. GET HELP RIGHT AWAY IF:   The pain does not go away within 2 hours.  You keep throwing up (vomiting).  The pain changes and is only in the right or left part of the belly.  You have bloody or tarry looking poop. MAKE SURE YOU:   Understand these instructions.  Will watch your condition.  Will get help right away if you are not doing well or get worse. Document  Released: 06/08/2007 Document Revised: 12/25/2012 Document Reviewed: 08/29/2012 [facility] Patient Information 2015 [place], [place]. This information is not intended to replace advice given to you by your health care provider. Make sure you discuss any questions you have with your health care provider.  Food Choices for Gastroesophageal Reflux Disease When you have gastroesophageal reflux disease (GERD), the foods you eat and your eating habits are very important. Choosing the right foods can help ease the discomfort of GERD. WHAT  GENERAL GUIDELINES DO I NEED TO FOLLOW?  Choose fruits, vegetables, whole grains, low-fat dairy products, and low-fat meat, fish, and poultry.  Limit fats such as oils, salad dressings, butter, nuts, and avocado.  Keep a food diary to identify foods that cause symptoms.  Avoid foods that cause reflux. These may be different for different people.  Eat frequent small meals instead of three large meals each day.  Eat your meals slowly, in a relaxed setting.  Limit fried foods.  Cook foods using methods other than frying.  Avoid drinking alcohol.  Avoid drinking large amounts of liquids with your meals.  Avoid bending over or lying down until 2-3 hours after eating. WHAT FOODS ARE NOT RECOMMENDED? The following are some foods and drinks that may worsen your symptoms: Vegetables Tomatoes. Tomato juice. Tomato and spaghetti sauce. Chili peppers. Onion and garlic. Horseradish. Fruits Oranges, grapefruit, and lemon (fruit and juice). Meats High-fat meats, fish, and poultry. This includes hot dogs, ribs, ham, sausage, salami, and bacon. Dairy Whole milk and chocolate milk. Sour cream. Cream. Butter. Ice cream. Cream cheese.  Beverages Coffee and tea, with or without caffeine. Carbonated beverages or energy drinks. Condiments Hot sauce. Barbecue sauce.  Sweets/Desserts Chocolate and cocoa. Donuts. Peppermint and spearmint. Fats and Oils High-fat foods, including [place] fries and potato chips. Other Vinegar. Strong spices, such as black pepper, white pepper, red pepper, cayenne, curry powder, cloves, ginger, and chili powder. The items listed above may not be a complete list of foods and beverages to avoid. Contact your dietitian for more information. Document Released: 12/20/2004 Document Revised: 12/25/2012 Document Reviewed: 10/24/2012 [facility] Patient Information 2015 [place], [place]. This information is not intended to replace advice given to you by your health care provider.  Make sure you discuss any questions you have with your health care provider.  Gastritis, Adult Gastritis is soreness and puffiness (inflammation) of the lining of the stomach. If you do not get help, gastritis can cause bleeding and sores (ulcers) in the stomach. HOME CARE   Only take medicine as told by your doctor.  If you were given antibiotic medicines, take them as told. Finish the medicines even if you start to feel better.  Drink enough fluids to keep your pee (urine) clear or pale yellow.  Avoid foods and drinks that make your problems worse. Foods you may want to avoid include:  Caffeine or alcohol.  Chocolate.  Mint.  Garlic and onions.  Spicy foods.  Citrus fruits, including oranges, lemons, or limes.  Food containing tomatoes, including sauce, chili, salsa, and pizza.  Fried and fatty foods.  Eat small meals throughout the day instead of large meals. GET HELP RIGHT AWAY IF:   You have black or dark red poop (stools).  You throw up (vomit) blood. It may look like coffee grounds.  You cannot keep fluids down.  Your belly (abdominal) pain gets worse.  You have a fever.  You do not feel better after 1 week.  You have any other questions or concerns. MAKE SURE  YOU:   Understand these instructions.  Will watch your condition.  Will get help right away if you are not doing well or get worse. Document Released: 06/08/2007 Document Revised: 03/14/2011 Document Reviewed: 02/02/2011 [facility] Patient Information 2015 [place], [place]. This information is not intended to replace advice given to you by your health care provider. Make sure you discuss any questions you have with your health care provider.  Hemorrhoids Hemorrhoids are puffy (swollen) veins around the rectum or anus. Hemorrhoids can cause pain, itching, bleeding, or irritation. HOME CARE  Eat foods with fiber, such as whole grains, beans, nuts, fruits, and vegetables. Ask your doctor about taking  products with added fiber in them (fibersupplements).  Drink enough fluid to keep your pee (urine) clear or pale yellow.  Exercise often.  Go to the bathroom when you have the urge to poop. Do not wait.  Avoid straining to poop (bowel movement).  Keep the butt area dry and clean. Use wet toilet paper or moist paper towels.  Medicated creams and medicine inserted into the anus (anal suppository) may be used or applied as told.  Only take medicine as told by your doctor.  Take a warm water bath (sitz bath) for 15-20 minutes to ease pain. Do this 3-4 times a day.  Place ice packs on the area if it is tender or puffy. Use the ice packs between the warm water baths.  Put ice in a plastic bag.  Place a towel between your skin and the bag.  Leave the ice on for 15-20 minutes, 03-04 times a day.  Do not use a donut-shaped pillow or sit on the toilet for a long time. GET HELP RIGHT AWAY IF:   You have more pain that is not controlled by treatment or medicine.  You have bleeding that will not stop.  You have trouble or are unable to poop (bowel movement).  You have pain or puffiness outside the area of the hemorrhoids. MAKE SURE YOU:   Understand these instructions.  Will watch your condition.  Will get help right away if you are not doing well or get worse. Document Released: 09/29/2007 Document Revised: 12/07/2011 Document Reviewed: 11/01/2011 [facility] Patient Information 2015 [place], [place]. This information is not intended to replace advice given to you by your health care provider. Make sure you discuss any questions you have with your health care provider.  Nausea and Vomiting Nausea is a sick feeling that often comes before throwing up (vomiting). Vomiting is a reflex where stomach contents come out of your mouth. Vomiting can cause severe loss of body fluids (dehydration). Children and elderly adults can become dehydrated quickly, especially if they also have diarrhea.  Nausea and vomiting are symptoms of a condition or disease. It is important to find the cause of your symptoms. CAUSES   Direct irritation of the stomach lining. This irritation can result from increased acid production (gastroesophageal reflux disease), infection, food poisoning, taking certain medicines (such as nonsteroidal anti-inflammatory drugs), alcohol use, or tobacco use.  Signals from the brain.These signals could be caused by a headache, heat exposure, an inner ear disturbance, increased pressure in the brain from injury, infection, a tumor, or a concussion, pain, emotional stimulus, or metabolic problems.  An obstruction in the gastrointestinal tract (bowel obstruction).  Illnesses such as diabetes, hepatitis, gallbladder problems, appendicitis, kidney problems, cancer, sepsis, atypical symptoms of a heart attack, or eating disorders.  Medical treatments such as chemotherapy and radiation.  Receiving medicine that makes you sleep (  general anesthetic) during surgery. DIAGNOSIS Your caregiver may ask for tests to be done if the problems do not improve after a few days. Tests may also be done if symptoms are severe or if the reason for the nausea and vomiting is not clear. Tests may include:  Urine tests.  Blood tests.  Stool tests.  Cultures (to look for evidence of infection).  X-rays or other imaging studies. Test results can help your caregiver make decisions about treatment or the need for additional tests. TREATMENT You need to stay well hydrated. Drink frequently but in small amounts.You may wish to drink water, sports drinks, clear broth, or eat frozen ice pops or gelatin dessert to help stay hydrated.When you eat, eating slowly may help prevent nausea.There are also some antinausea medicines that may help prevent nausea. HOME CARE INSTRUCTIONS   Take all medicine as directed by your caregiver.  If you do not have an appetite, do not force yourself to eat.  However, you must continue to drink fluids.  If you have an appetite, eat a normal diet unless your caregiver tells you differently.  Eat a variety of complex carbohydrates (rice, wheat, potatoes, bread), lean meats, yogurt, fruits, and vegetables.  Avoid high-fat foods because they are more difficult to digest.  Drink enough water and fluids to keep your urine clear or pale yellow.  If you are dehydrated, ask your caregiver for specific rehydration instructions. Signs of dehydration may include:  Severe thirst.  Dry lips and mouth.  Dizziness.  Dark urine.  Decreasing urine frequency and amount.  Confusion.  Rapid breathing or pulse. SEEK IMMEDIATE MEDICAL CARE IF:   You have blood or brown flecks (like coffee grounds) in your vomit.  You have black or bloody stools.  You have a severe headache or stiff neck.  You are confused.  You have severe abdominal pain.  You have chest pain or trouble breathing.  You do not urinate at least once every 8 hours.  You develop cold or clammy skin.  You continue to vomit for longer than 24 to 48 hours.  You have a fever. MAKE SURE YOU:   Understand these instructions.  Will watch your condition.  Will get help right away if you are not doing well or get worse. Document Released: 12/20/2004 Document Revised: 03/14/2011 Document Reviewed: 05/19/2010 [facility] Patient Information 2015 [place], [place]. This information is not intended to replace advice given to you by your health care provider. Make sure you discuss any questions you have with your health care provider.

## 2014-05-30 ENCOUNTER — Ambulatory Visit: Payer: Medicare Other | Admitting: Gastroenterology

## 2014-06-12 ENCOUNTER — Other Ambulatory Visit: Payer: Self-pay | Admitting: Infectious Diseases

## 2014-08-05 ENCOUNTER — Ambulatory Visit: Payer: Medicare Other

## 2014-10-21 ENCOUNTER — Telehealth: Payer: Self-pay | Admitting: *Deleted

## 2014-10-21 NOTE — Telephone Encounter (Signed)
Patient called and advised he has been having some trouble with frequent urination at night. He advised he can not sleep because he gets up several times a night and it is affecting him during the day. He wants to see the doctor about this asap. Gave him an appt for first available 11/04/14 with Dr Megan Salon.

## 2014-10-23 ENCOUNTER — Other Ambulatory Visit: Payer: Self-pay | Admitting: Infectious Diseases

## 2014-10-23 ENCOUNTER — Other Ambulatory Visit (HOSPITAL_COMMUNITY)
Admission: RE | Admit: 2014-10-23 | Discharge: 2014-10-23 | Disposition: A | Discharge status: Home / Self Care | Payer: Medicare Other | Source: Ambulatory Visit | Attending: Infectious Diseases | Admitting: Infectious Diseases

## 2014-10-23 ENCOUNTER — Other Ambulatory Visit: Payer: Medicare Other

## 2014-10-23 DIAGNOSIS — Z113 Encounter for screening for infections with a predominantly sexual mode of transmission: Secondary | ICD-10-CM | POA: Diagnosis present

## 2014-10-23 DIAGNOSIS — B20 Human immunodeficiency virus [HIV] disease: Secondary | ICD-10-CM

## 2014-10-23 LAB — CBC
HCT: 45 % (ref 39.0–52.0)
Hemoglobin: 15.5 g/dL (ref 13.0–17.0)
MCH: 34 pg (ref 26.0–34.0)
MCHC: 34.4 g/dL (ref 30.0–36.0)
MCV: 98.7 fL (ref 78.0–100.0)
MPV: 9.8 fL (ref 8.6–12.4)
Platelets: 262 10*3/uL (ref 150–400)
RBC: 4.56 MIL/uL (ref 4.22–5.81)
RDW: 12.9 % (ref 11.5–15.5)
WBC: 6.3 10*3/uL (ref 4.0–10.5)

## 2014-10-23 LAB — COMPREHENSIVE METABOLIC PANEL
ALK PHOS: 58 U/L (ref 40–115)
ALT: 19 U/L (ref 9–46)
AST: 24 U/L (ref 10–35)
Albumin: 4.1 g/dL (ref 3.6–5.1)
BILIRUBIN TOTAL: 0.5 mg/dL (ref 0.2–1.2)
BUN: 10 mg/dL (ref 7–25)
CO2: 29 mmol/L (ref 20–31)
CREATININE: 1.27 mg/dL (ref 0.70–1.33)
Calcium: 9.4 mg/dL (ref 8.6–10.3)
Chloride: 106 mmol/L (ref 98–110)
Glucose, Bld: 92 mg/dL (ref 65–99)
Potassium: 4.4 mmol/L (ref 3.5–5.3)
SODIUM: 142 mmol/L (ref 135–146)
Total Protein: 7.4 g/dL (ref 6.1–8.1)

## 2014-10-24 LAB — URINE CYTOLOGY ANCILLARY ONLY
Chlamydia: NEGATIVE
NEISSERIA GONORRHEA: NEGATIVE
Trichomonas: NEGATIVE

## 2014-10-24 LAB — T-HELPER CELL (CD4) - (RCID CLINIC ONLY)
CD4 % Helper T Cell: 35 % (ref 33–55)
CD4 T Cell Abs: 810 /uL (ref 400–2700)

## 2014-10-24 LAB — HIV-1 RNA QUANT-NO REFLEX-BLD

## 2014-10-25 LAB — RPR: RPR: REACTIVE — AB

## 2014-10-25 LAB — RPR TITER

## 2014-10-27 LAB — FLUORESCENT TREPONEMAL AB(FTA)-IGG-BLD: Fluorescent Treponemal ABS: REACTIVE — AB

## 2014-11-04 ENCOUNTER — Ambulatory Visit (INDEPENDENT_AMBULATORY_CARE_PROVIDER_SITE_OTHER): Payer: Medicare Other | Admitting: Internal Medicine

## 2014-11-04 ENCOUNTER — Encounter: Payer: Self-pay | Admitting: Internal Medicine

## 2014-11-04 DIAGNOSIS — R350 Frequency of micturition: Secondary | ICD-10-CM

## 2014-11-04 DIAGNOSIS — B20 Human immunodeficiency virus [HIV] disease: Secondary | ICD-10-CM | POA: Diagnosis not present

## 2014-11-04 MED ORDER — EMTRICITAB-RILPIVIR-TENOFOV AF 200-25-25 MG PO TABS: 1.0000 | ORAL_TABLET | Freq: Every day | ORAL | Status: DC

## 2014-11-04 NOTE — Progress Notes (Signed)
Patient is to be referred to [facility] after the patient has contacted their business office first.  He was advised to call us back after he has talked to them so that the referral can be done.
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
## 2014-11-04 NOTE — Assessment & Plan Note (Signed)
His infection remains under excellent control. I will change Complera to the new, safer version called Odefsey. He will follow-up after lab work in 6 months.

## 2014-11-04 NOTE — Assessment & Plan Note (Signed)
He has a long history of troubling lower urinary tract symptoms of unknown origin. I will check a urinalysis today. When we called to make a referral to [facility] we were informed that he has an outstanding bill there and that he would need to speak with the business office before they could see him again. We asked for a release of information to review those records.

## 2014-11-05 LAB — URINALYSIS, ROUTINE W REFLEX MICROSCOPIC
BILIRUBIN URINE: NEGATIVE
Glucose, UA: NEGATIVE
Hgb urine dipstick: NEGATIVE
Ketones, ur: NEGATIVE
Leukocytes, UA: NEGATIVE
NITRITE: NEGATIVE
PROTEIN: NEGATIVE
Specific Gravity, Urine: 1.007 (ref 1.001–1.035)
pH: 7.5 (ref 5.0–8.0)

## 2014-11-10 ENCOUNTER — Telehealth: Payer: Self-pay | Admitting: *Deleted

## 2014-11-10 NOTE — Telephone Encounter (Signed)
Patient called to inquire about his referral to a urologist. Per Dr. Hale Bogus note he needs to call the business office at [facility] before he can be scheduled an appointment. Myrtis Hopping

## 2014-11-12 ENCOUNTER — Telehealth: Payer: Self-pay | Admitting: Internal Medicine

## 2014-11-12 ENCOUNTER — Other Ambulatory Visit: Payer: Self-pay | Admitting: *Deleted

## 2014-11-12 DIAGNOSIS — B20 Human immunodeficiency virus [HIV] disease: Secondary | ICD-10-CM

## 2014-11-12 MED ORDER — EMTRICITAB-RILPIVIR-TENOFOV AF 200-25-25 MG PO TABS: 1.0000 | ORAL_TABLET | Freq: Every day | ORAL | Status: DC

## 2014-11-12 NOTE — Telephone Encounter (Signed)
Received vm on medication assistance line, they received an rx that is contraindicated with another drug, please call back to advise.

## 2014-11-12 NOTE — Telephone Encounter (Signed)
RN called [facility]. Confirmed that patient needs to use [facility] for his HIV medication.  RN cancelled the odesfey rx at [facility], cancelled the [place] at [facility].  Sent new rx for Odefsey to [facility].  Patient aware.

## 2014-12-04 ENCOUNTER — Telehealth: Payer: Self-pay | Admitting: *Deleted

## 2014-12-04 NOTE — Telephone Encounter (Signed)
Patient has spoken with [facility], can go ahead and be referred/scheduled. Patient scheduled with previous physician, Dr. Risa Grill, on 2/3 at 10:30.  He will be placed on a call list if something opens up sooner. Referral, notes, demographics, labs faxed to [id number]. Patient aware of appointment. Landis Gandy, RN

## 2014-12-23 ENCOUNTER — Telehealth: Payer: Self-pay

## 2014-12-23 NOTE — Telephone Encounter (Addendum)
LVM to CB and schedule appt. For Financial Counseling during 01/05/14-02/12/14 to renew ADAP also needs to schedule appt for 6 month fu in May

## 2015-02-06 DIAGNOSIS — Z0000 Encounter for general adult medical examination without abnormal findings: Secondary | ICD-10-CM | POA: Diagnosis not present

## 2015-02-06 DIAGNOSIS — R3915 Urgency of urination: Secondary | ICD-10-CM | POA: Diagnosis not present

## 2015-02-06 DIAGNOSIS — N138 Other obstructive and reflux uropathy: Secondary | ICD-10-CM | POA: Diagnosis not present

## 2015-02-06 DIAGNOSIS — R350 Frequency of micturition: Secondary | ICD-10-CM | POA: Diagnosis not present

## 2015-02-06 DIAGNOSIS — N401 Enlarged prostate with lower urinary tract symptoms: Secondary | ICD-10-CM | POA: Diagnosis not present

## 2015-02-06 DIAGNOSIS — R3129 Other microscopic hematuria: Secondary | ICD-10-CM | POA: Diagnosis not present

## 2015-03-06 DIAGNOSIS — R3129 Other microscopic hematuria: Secondary | ICD-10-CM | POA: Diagnosis not present

## 2015-03-17 ENCOUNTER — Telehealth: Payer: Self-pay | Admitting: Internal Medicine

## 2015-03-17 NOTE — Telephone Encounter (Signed)
Caller name: Bethena Roys  Relation to pt: [facility] Call back number: [phone]  Ext [phone]   Reason for call:   Provider at [facility] would like patient seen sooner than I can offer.  Pt has rectal thickening, seen on CT scan Bethena Roys is faxing over).  You may contact pt to schedule; please forward message back to me once scheduled so I can let Bethena Roys know.

## 2015-03-18 ENCOUNTER — Ambulatory Visit: Payer: Medicare Other | Admitting: Physician Assistant

## 2015-03-19 ENCOUNTER — Encounter: Payer: Self-pay | Admitting: Gastroenterology

## 2015-03-25 ENCOUNTER — Ambulatory Visit (INDEPENDENT_AMBULATORY_CARE_PROVIDER_SITE_OTHER): Payer: Medicare Other | Admitting: Gastroenterology

## 2015-03-25 ENCOUNTER — Encounter: Payer: Self-pay | Admitting: Gastroenterology

## 2015-03-25 VITALS — BP 118/96 | HR 76 | Ht 69.75 in | Wt 209.5 lb

## 2015-03-25 DIAGNOSIS — R933 Abnormal findings on diagnostic imaging of other parts of digestive tract: Secondary | ICD-10-CM

## 2015-03-25 MED ORDER — SUPREP BOWEL PREP KIT 17.5-3.13-1.6 GM/177ML PO SOLN: ORAL | Status: DC

## 2015-03-25 NOTE — Progress Notes (Addendum)
03/25/2015 Gad Strausbaugh [id number] 1960-05-16   HISTORY OF PRESENT ILLNESS:  This is a 55 year old AA male who is new to our practice.  He has been sent here by urology, Dr. Risa Grill, for evaluation of an abnormal CT scan that showed thickening of the rectum and distal sigmoid colon. This CT scan was performed on March 3 of the abdomen and pelvis with and without contrast. It showed continuous segment of distal sigmoid colon and rectal bowel wall thickening suggesting a segmental colitis/proctitis but cannot completely exclude rectal carcinoma but less favored. He has never undergone colonoscopy in the past. He does not have any family history of colon cancer. He denies absolutely any GI complaints including issues with moving his bowels, rectal bleeding, abdominal pain. Recent CBC and CMP are within normal limits.   Past Medical History  Diagnosis Date  . HIV disease ([place])   . Syphilis   . Hypertension   . Anxiety   . Pneumonia    Past Surgical History  Procedure Laterality Date  . Knee arthroscopy Right     reports that he has never smoked. He has never used smokeless tobacco. He reports that he drinks about 0.6 oz of alcohol per week. He reports that he does not use illicit drugs. family history includes Breast cancer in his paternal aunt; Other in his father; Pancreatic cancer in his maternal grandfather. Allergies  Allergen Reactions  . Sulfamethoxazole-Trimethoprim Other (See Comments)    Unknown allergic reaction  . Sulfonamide Derivatives       Outpatient Encounter Prescriptions as of 03/25/2015  Medication Sig  . emtricitabine-rilpivir-tenofovir AF (ODEFSEY) 200-25-25 MG TABS tablet Take 1 tablet by mouth daily.  Marland Kitchen lisinopril (PRINIVIL,ZESTRIL) 20 MG tablet take 1 tablet by mouth once daily  . SUPREP BOWEL PREP SOLN Use as directed  . [DISCONTINUED] cyclobenzaprine (FLEXERIL) 10 MG tablet Take 1 tablet (10 mg total) by mouth 2 (two) times daily as needed for muscle  spasms. (Patient not taking: Reported on 04/14/2014)  . [DISCONTINUED] hydrocortisone (ANUSOL-HC) 25 MG suppository Place 1 suppository (25 mg total) rectally 2 (two) times daily. For 7 days (Patient not taking: Reported on 11/04/2014)  . [DISCONTINUED] ondansetron (ZOFRAN) 8 MG tablet Take 1 tablet (8 mg total) by mouth every 8 (eight) hours as needed for nausea or vomiting. (Patient not taking: Reported on 11/04/2014)  . [DISCONTINUED] oxyCODONE-acetaminophen (PERCOCET/ROXICET) 5-325 MG per tablet Take 1-2 tablets by mouth every 6 (six) hours as needed for severe pain. (Patient not taking: Reported on 04/14/2014)   No facility-administered encounter medications on file as of 03/25/2015.     REVIEW OF SYSTEMS  : All other systems reviewed and negative except where noted in the History of Present Illness.   PHYSICAL EXAM: BP 118/96 mmHg  Pulse 76  Ht 5' 9.75" (1.772 m)  Wt 209 lb 8 oz (95.029 kg)  BMI 30.26 kg/m2 General: Well developed black male in no acute distress Head: Normocephalic and atraumatic Eyes:  Sclerae anicteric, conjunctiva pink. Ears: Normal auditory acuity Lungs: Clear throughout to auscultation Heart: Regular rate and rhythm Abdomen: Soft, non-distended.  Normal bowel sounds.  Non-tender. Rectal:  Will be done at the time of colonoscopy. Musculoskeletal: Symmetrical with no gross deformities  Skin: No lesions on visible extremities Extremities: No edema  Neurological: Alert oriented x 4, grossly non-focal Psychological:  Alert and cooperative. Normal mood and affect  ASSESSMENT AND PLAN: -Abnormal CT scan of the colon:  CT showing thickening of the rectum  and distal sigmoid colon.  Never had colonoscopy.  Will schedule with Dr. Hilarie Fredrickson.  Patient has no GI complaints.  The risks, benefits, and alternatives to colonoscopy were discussed with the patient and he consents to proceed.  CC: Dr. Risa Grill [facility]  Addendum: Reviewed and agree with initial  management. Jerene Bears, MD

## 2015-03-25 NOTE — Patient Instructions (Signed)
  You have been scheduled for a colonoscopy. Please follow written instructions given to you at your visit today.  Please use the suprep kit we gave you today. If you use inhalers (even only as needed), please bring them with you on the day of your procedure.   I appreciate the opportunity to care for you.

## 2015-03-26 ENCOUNTER — Encounter: Payer: Medicare Other | Admitting: Internal Medicine

## 2015-04-07 ENCOUNTER — Ambulatory Visit (AMBULATORY_SURGERY_CENTER): Payer: Medicare Other | Admitting: Internal Medicine

## 2015-04-07 ENCOUNTER — Encounter: Payer: Self-pay | Admitting: Internal Medicine

## 2015-04-07 VITALS — BP 128/98 | HR 79 | Temp 97.8°F | Resp 14 | Ht 69.75 in | Wt 209.0 lb

## 2015-04-07 DIAGNOSIS — R933 Abnormal findings on diagnostic imaging of other parts of digestive tract: Secondary | ICD-10-CM | POA: Diagnosis present

## 2015-04-07 DIAGNOSIS — K629 Disease of anus and rectum, unspecified: Secondary | ICD-10-CM

## 2015-04-07 DIAGNOSIS — Z1211 Encounter for screening for malignant neoplasm of colon: Secondary | ICD-10-CM | POA: Diagnosis not present

## 2015-04-07 DIAGNOSIS — K626 Ulcer of anus and rectum: Secondary | ICD-10-CM | POA: Diagnosis not present

## 2015-04-07 MED ORDER — SODIUM CHLORIDE 0.9 % IV SOLN: 500.0000 mL | INTRAVENOUS | Status: DC

## 2015-04-07 NOTE — Progress Notes (Signed)
A and o x3 Report to RN 

## 2015-04-07 NOTE — Op Note (Signed)
Max Ray Patient Name: Max Ray Procedure Date: 04/07/2015 3:48 PM MRN: [id number] Endoscopist: Jerene Bears , MD Age: 55 Referring MD:  Date of Birth: 1960-07-24 Gender: Male Procedure:                Colonoscopy Indications:              Abnormal CT of the GI tract showing rectal                            thickening Medicines:                Monitored Anesthesia Care Procedure:                Pre-Anesthesia Assessment:                           - Prior to the procedure, a History and Physical                            was performed, and patient medications and                            allergies were reviewed. The patient's tolerance of                            previous anesthesia was also reviewed. The risks                            and benefits of the procedure and the sedation                            options and risks were discussed with the patient.                            All questions were answered, and informed consent                            was obtained. Prior Anticoagulants: The patient has                            taken no previous anticoagulant or antiplatelet                            agents. ASA Grade Assessment: III - A patient with                            severe systemic disease. After reviewing the risks                            and benefits, the patient was deemed in                            satisfactory condition to undergo the procedure.  After obtaining informed consent, the colonoscope                            was passed under direct vision. Throughout the                            procedure, the patient's blood pressure, pulse, and                            oxygen saturations were monitored continuously. The                            Model CF-HQ190L [id number]) scope was introduced                            through the anus and advanced to the the cecum,   identified by appendiceal orifice and ileocecal                            valve. The colonoscopy was performed without                            difficulty. The patient tolerated the procedure                            well. The quality of the bowel preparation was                            fair. The ileocecal valve, appendiceal orifice, and                            rectum were photographed. Scope In: 4:02:19 PM Scope Out: 4:15:12 PM Scope Withdrawal Time: 0 hours 11 minutes 7 seconds  Total Procedure Duration: 0 hours 12 minutes 53 seconds  Findings:      The perianal exam findings include anal condyloma.      Nonbleeding ulcerated (superficial) mucosa with no stigmata of recent       bleeding were present in the mid rectum (at 1st valve). Benign-appearing       but of unclear etiology. Biopsies were taken with a cold forceps for       histology.      The exam was otherwise normal throughout the examined colon.      A single 6 mm anal condyloma was found on forward and retroflexed views. Complications:            No immediate complications. Estimated Blood Loss:     Estimated blood loss was minimal. Impression:               - Preparation of the colon was fair.                           - Mucosal ulceration in the rectum. Biopsied.                           - Condyloma in the anal canal. Recommendation:           -  Written discharge instructions were provided to                            the patient.                           - Resume previous diet.                           - Continue present medications.                           - Avoid aspirin, ibuprofen, naproxen, or other                            non-steroidal anti-inflammatory drugs.                           - Await pathology results.                           - Repeat colonoscopy in 1 year because the bowel                            preparation was suboptimal.                           - Refer to Dr. Johney Ray at  [facility] at                            appointment to be scheduled for further evaluation                            of anal condyloma .                           - Patient has a contact number available for                            emergencies. The signs and symptoms of potential                            delayed complications were discussed with the                            patient. Return to normal activities tomorrow.                            Written discharge instructions were provided to the                            patient. Jerene Bears, MD 04/07/2015 4:27:55 PM This report has been signed electronically. Number of Addenda: 0 CC Letter to:             Max Ray Referring MD:      Max Ray

## 2015-04-07 NOTE — Progress Notes (Signed)
Called to room to assist during endoscopic procedure.  Patient ID and intended procedure confirmed with present staff. Received instructions for my participation in the procedure from the performing physician.  

## 2015-04-07 NOTE — Patient Instructions (Signed)
YOU HAD AN ENDOSCOPIC PROCEDURE TODAY AT [facility]:   Refer to the procedure report that was given to you for any specific questions about what was found during the examination.  If the procedure report does not answer your questions, please call your gastroenterologist to clarify.  If you requested that your care partner not be given the details of your procedure findings, then the procedure report has been included in a sealed envelope for you to review at your convenience later.  YOU SHOULD EXPECT: Some feelings of bloating in the abdomen. Passage of more gas than usual.  Walking can help get rid of the air that was put into your GI tract during the procedure and reduce the bloating. If you had a lower endoscopy (such as a colonoscopy or flexible sigmoidoscopy) you may notice spotting of blood in your stool or on the toilet paper. If you underwent a bowel prep for your procedure, you may not have a normal bowel movement for a few days.  Please Note:  You might notice some irritation and congestion in your nose or some drainage.  This is from the oxygen used during your procedure.  There is no need for concern and it should clear up in a day or so.  SYMPTOMS TO REPORT IMMEDIATELY:   Following lower endoscopy (colonoscopy or flexible sigmoidoscopy):  Excessive amounts of blood in the stool  Significant tenderness or worsening of abdominal pains  Swelling of the abdomen that is new, acute  Fever of 100F or higher   For urgent or emergent issues, a gastroenterologist can be reached at any hour by calling [phone].   DIET: Your first meal following the procedure should be a small meal and then it is ok to progress to your normal diet. Heavy or fried foods are harder to digest and may make you feel nauseous or bloated.  Likewise, meals heavy in dairy and vegetables can increase bloating.  Drink plenty of fluids but you should avoid alcoholic beverages for 24  hours.  ACTIVITY:  You should plan to take it easy for the rest of today and you should NOT DRIVE or use heavy machinery until tomorrow (because of the sedation medicines used during the test).    FOLLOW UP: Our staff will call the number listed on your records the next business day following your procedure to check on you and address any questions or concerns that you may have regarding the information given to you following your procedure. If we do not reach you, we will leave a message.  However, if you are feeling well and you are not experiencing any problems, there is no need to return our call.  We will assume that you have returned to your regular daily activities without incident.  If any biopsies were taken you will be contacted by phone or by letter within the next 1-3 weeks.  Please call us at [phone] if you have not heard about the biopsies in 3 weeks.   Avoid aspirin, ibuprofen, naproxren or other non steroidal anti-inflammatory meds. Repeat colonoscopy in 1 year. Referral to Dr. Johney Maine at [facility] surgery.   SIGNATURES/CONFIDENTIALITY: You and/or your care partner have signed paperwork which will be entered into your electronic medical record.  These signatures attest to the fact that that the information above on your After Visit Summary has been reviewed and is understood.  Full responsibility of the confidentiality of this discharge information lies with you and/or your care-partner.

## 2015-04-08 ENCOUNTER — Telehealth: Payer: Self-pay

## 2015-04-08 NOTE — Telephone Encounter (Signed)
Called [phone] and the voice mail said I could not leave a message that the pt's voice mail box was full.  I also checked to make sure the tel number was correct on snap shot and it was. maw

## 2015-04-09 ENCOUNTER — Telehealth: Payer: Self-pay

## 2015-04-09 NOTE — Telephone Encounter (Signed)
Pt scheduled to see Dr. Johney Maine for Anal condyloma 04/27/15@8 :45am, pt to arrive there at 8:15am. Pt aware of appt.

## 2015-04-14 ENCOUNTER — Ambulatory Visit: Payer: Medicare Other

## 2015-04-20 ENCOUNTER — Encounter: Payer: Self-pay | Admitting: Internal Medicine

## 2015-05-11 DIAGNOSIS — A630 Anogenital (venereal) warts: Secondary | ICD-10-CM | POA: Diagnosis not present

## 2015-05-12 ENCOUNTER — Other Ambulatory Visit (HOSPITAL_COMMUNITY)
Admission: RE | Admit: 2015-05-12 | Discharge: 2015-05-12 | Disposition: A | Discharge status: Home / Self Care | Payer: Medicare Other | Source: Ambulatory Visit | Attending: Infectious Diseases | Admitting: Infectious Diseases

## 2015-05-12 ENCOUNTER — Other Ambulatory Visit: Payer: Medicare Other

## 2015-05-12 DIAGNOSIS — B20 Human immunodeficiency virus [HIV] disease: Secondary | ICD-10-CM | POA: Diagnosis not present

## 2015-05-12 DIAGNOSIS — Z113 Encounter for screening for infections with a predominantly sexual mode of transmission: Secondary | ICD-10-CM

## 2015-05-12 DIAGNOSIS — Z79899 Other long term (current) drug therapy: Secondary | ICD-10-CM | POA: Diagnosis not present

## 2015-05-12 LAB — LIPID PANEL
CHOL/HDL RATIO: 3.5 ratio (ref <=5.0)
CHOLESTEROL: 173 mg/dL (ref 125–200)
HDL: 49 mg/dL (ref >=40)
LDL Cholesterol: 88 mg/dL (ref <130)
Triglycerides: 179 mg/dL — ABNORMAL HIGH (ref <150)
VLDL: 36 mg/dL — ABNORMAL HIGH (ref <30)

## 2015-05-12 LAB — CBC
HEMATOCRIT: 45.2 % (ref 38.5–50.0)
Hemoglobin: 15.8 g/dL (ref 13.2–17.1)
MCH: 34.6 pg — AB (ref 27.0–33.0)
MCHC: 35 g/dL (ref 32.0–36.0)
MCV: 99.1 fL (ref 80.0–100.0)
MPV: 10.1 fL (ref 7.5–12.5)
Platelets: 247 10*3/uL (ref 140–400)
RBC: 4.56 MIL/uL (ref 4.20–5.80)
RDW: 13.1 % (ref 11.0–15.0)
WBC: 7.1 10*3/uL (ref 3.8–10.8)

## 2015-05-12 LAB — COMPREHENSIVE METABOLIC PANEL
ALBUMIN: 4.3 g/dL (ref 3.6–5.1)
ALK PHOS: 62 U/L (ref 40–115)
ALT: 17 U/L (ref 9–46)
AST: 23 U/L (ref 10–35)
BUN: 9 mg/dL (ref 7–25)
CO2: 27 mmol/L (ref 20–31)
CREATININE: 1.2 mg/dL (ref 0.70–1.33)
Calcium: 9.3 mg/dL (ref 8.6–10.3)
Chloride: 103 mmol/L (ref 98–110)
Glucose, Bld: 91 mg/dL (ref 65–99)
Potassium: 4.1 mmol/L (ref 3.5–5.3)
Sodium: 139 mmol/L (ref 135–146)
TOTAL PROTEIN: 7.4 g/dL (ref 6.1–8.1)
Total Bilirubin: 0.6 mg/dL (ref 0.2–1.2)

## 2015-05-12 NOTE — Addendum Note (Signed)
Addended by: Dolan Amen D on: 05/12/2015 04:29 PM   Modules accepted: Orders

## 2015-05-13 LAB — RPR: RPR Ser Ql: REACTIVE — AB

## 2015-05-13 LAB — HIV-1 RNA QUANT-NO REFLEX-BLD: HIV-1 RNA Quant, Log: <1.3 Log copies/mL (ref <1.30)

## 2015-05-13 LAB — URINE CYTOLOGY ANCILLARY ONLY
CHLAMYDIA, DNA PROBE: NEGATIVE
Neisseria Gonorrhea: NEGATIVE

## 2015-05-13 LAB — T-HELPER CELL (CD4) - (RCID CLINIC ONLY)
CD4 % Helper T Cell: 36 % (ref 33–55)
CD4 T Cell Abs: 910 /uL (ref 400–2700)

## 2015-05-13 LAB — RPR TITER

## 2015-05-14 LAB — FLUORESCENT TREPONEMAL AB(FTA)-IGG-BLD: Fluorescent Treponemal ABS: REACTIVE — AB

## 2015-05-20 ENCOUNTER — Encounter: Payer: Self-pay | Admitting: Infectious Diseases

## 2015-05-20 ENCOUNTER — Ambulatory Visit (INDEPENDENT_AMBULATORY_CARE_PROVIDER_SITE_OTHER): Payer: Medicare Other | Admitting: Infectious Diseases

## 2015-05-20 VITALS — BP 134/86 | HR 84 | Temp 99.0°F | Ht 70.0 in | Wt 209.0 lb

## 2015-05-20 DIAGNOSIS — A539 Syphilis, unspecified: Secondary | ICD-10-CM | POA: Diagnosis not present

## 2015-05-20 DIAGNOSIS — N289 Disorder of kidney and ureter, unspecified: Secondary | ICD-10-CM | POA: Diagnosis not present

## 2015-05-20 DIAGNOSIS — B20 Human immunodeficiency virus [HIV] disease: Secondary | ICD-10-CM | POA: Diagnosis not present

## 2015-05-20 DIAGNOSIS — I10 Essential (primary) hypertension: Secondary | ICD-10-CM | POA: Diagnosis not present

## 2015-05-20 MED ORDER — LOSARTAN POTASSIUM 25 MG PO TABS: 25.0000 mg | ORAL_TABLET | Freq: Every day | ORAL | Status: DC

## 2015-05-20 NOTE — Assessment & Plan Note (Signed)
Steady at 1.20

## 2015-05-20 NOTE — Assessment & Plan Note (Signed)
Last 1:1

## 2015-05-20 NOTE — Progress Notes (Signed)
   Subjective:    Patient ID: Max Ray, male    DOB: March 04, 1960, 55 y.o.   MRN: [id number]  HPI 55 yo M with hx of HIV+, HTN and non-compliance. Is on complera. Is Hep B SAb+. He has been seen at [facility] since last visit and then sent to GI for abn CT scan (recatal inflammation). He was found to have condyloma and rectal ulcer, bx sent (benign), and he was sent to surgery. His condyloma was frozen off.  Has quit taking NSAIDs  He was prev started on lisonopril, he has been having cough since on this. He has not quit taking yet.   No problems with odefsy. Taking with food daily.   HIV 1 RNA QUANT (copies/mL)  Date Value  05/12/2015 <20  10/23/2014 <20  02/13/2014 <20   CD4 T CELL ABS (/uL)  Date Value  05/12/2015 910  10/23/2014 810  02/13/2014 1060     Review of Systems  Constitutional: Negative for appetite change and unexpected weight change.  Respiratory: Positive for cough.   Gastrointestinal: Negative for diarrhea and constipation.  Genitourinary: Negative for difficulty urinating.  Musculoskeletal: Positive for joint swelling.  is going to try OTC, natural for joint pain.  overactive bladder (urinates several times a night into a urinal)- is going to try natural remedy (super beta prostate).      Objective:   Physical Exam  Constitutional: He appears well-developed and well-nourished.  HENT:  Mouth/Throat: No oropharyngeal exudate.  Eyes: EOM are normal. Pupils are equal, round, and reactive to light.  Neck: Neck supple.  Cardiovascular: Normal rate, regular rhythm and normal heart sounds.   Pulmonary/Chest: Effort normal and breath sounds normal.  Abdominal: Soft. Bowel sounds are normal. There is no tenderness.  Lymphadenopathy:    He has no cervical adenopathy.       Assessment & Plan:

## 2015-05-20 NOTE — Assessment & Plan Note (Signed)
Will continue him on his current art He is doing well.  Offered/refused condoms.  Will see him back in 6 months.

## 2015-05-20 NOTE — Assessment & Plan Note (Signed)
Will change him to ARB to see if his cough improves (he did not cough in clinic).  Curious if this is actually allergic sinusitis.

## 2015-05-28 ENCOUNTER — Ambulatory Visit: Payer: Medicare Other | Admitting: Infectious Diseases

## 2015-07-17 ENCOUNTER — Ambulatory Visit (HOSPITAL_COMMUNITY): Payer: Medicare Other | Admitting: Psychiatry

## 2015-10-08 ENCOUNTER — Telehealth: Payer: Self-pay | Admitting: *Deleted

## 2015-10-08 ENCOUNTER — Other Ambulatory Visit: Payer: Self-pay | Admitting: Infectious Diseases

## 2015-10-08 ENCOUNTER — Other Ambulatory Visit (HOSPITAL_COMMUNITY)
Admission: RE | Admit: 2015-10-08 | Discharge: 2015-10-08 | Disposition: A | Discharge status: Home / Self Care | Payer: Medicare Other | Source: Ambulatory Visit | Attending: Infectious Diseases | Admitting: Infectious Diseases

## 2015-10-08 ENCOUNTER — Other Ambulatory Visit: Payer: Medicare Other

## 2015-10-08 DIAGNOSIS — Z113 Encounter for screening for infections with a predominantly sexual mode of transmission: Secondary | ICD-10-CM | POA: Insufficient documentation

## 2015-10-08 DIAGNOSIS — B20 Human immunodeficiency virus [HIV] disease: Secondary | ICD-10-CM

## 2015-10-08 LAB — CBC
HCT: 40.4 % (ref 38.5–50.0)
HEMOGLOBIN: 13.4 g/dL (ref 13.2–17.1)
MCH: 34.4 pg — AB (ref 27.0–33.0)
MCHC: 33.2 g/dL (ref 32.0–36.0)
MCV: 103.6 fL — ABNORMAL HIGH (ref 80.0–100.0)
MPV: 9.2 fL (ref 7.5–12.5)
PLATELETS: 247 10*3/uL (ref 140–400)
RBC: 3.9 MIL/uL — AB (ref 4.20–5.80)
RDW: 13.6 % (ref 11.0–15.0)
WBC: 10 10*3/uL (ref 3.8–10.8)

## 2015-10-08 NOTE — Telephone Encounter (Signed)
Patient came for his labs today and advised Santiago Glad, lab tech that he needs to get an RPR and urine GC/Chlamydia at every lab appointment. States he has spoken to Dr. Johnnye Sima about this previously. Please advise if this is correct and address at upcoming appt on 10/22/15.

## 2015-10-08 NOTE — Addendum Note (Signed)
Addended by: Dolan Amen D on: 10/08/2015 05:07 PM   Modules accepted: Orders

## 2015-10-09 LAB — HIV-1 RNA QUANT-NO REFLEX-BLD

## 2015-10-09 LAB — COMPREHENSIVE METABOLIC PANEL
ALBUMIN: 4 g/dL (ref 3.6–5.1)
ALK PHOS: 65 U/L (ref 40–115)
ALT: 18 U/L (ref 9–46)
AST: 25 U/L (ref 10–35)
BILIRUBIN TOTAL: 0.5 mg/dL (ref 0.2–1.2)
BUN: 10 mg/dL (ref 7–25)
CALCIUM: 9.3 mg/dL (ref 8.6–10.3)
CO2: 27 mmol/L (ref 20–31)
Chloride: 103 mmol/L (ref 98–110)
Creat: 1.34 mg/dL — ABNORMAL HIGH (ref 0.70–1.33)
Glucose, Bld: 68 mg/dL (ref 65–99)
POTASSIUM: 3.7 mmol/L (ref 3.5–5.3)
Sodium: 139 mmol/L (ref 135–146)
TOTAL PROTEIN: 7.5 g/dL (ref 6.1–8.1)

## 2015-10-09 LAB — T-HELPER CELL (CD4) - (RCID CLINIC ONLY)
CD4 T CELL HELPER: 29 % — AB (ref 33–55)
CD4 T Cell Abs: 1370 /uL (ref 400–2700)

## 2015-10-09 NOTE — Telephone Encounter (Signed)
These tests are correct

## 2015-10-10 LAB — RPR: RPR Ser Ql: REACTIVE — AB

## 2015-10-10 LAB — RPR TITER: RPR Titer: 1:2 {titer}

## 2015-10-12 LAB — FLUORESCENT TREPONEMAL AB(FTA)-IGG-BLD: Fluorescent Treponemal ABS: REACTIVE — AB

## 2015-10-12 LAB — URINE CYTOLOGY ANCILLARY ONLY
Chlamydia: NEGATIVE
Neisseria Gonorrhea: NEGATIVE

## 2015-10-22 ENCOUNTER — Ambulatory Visit (INDEPENDENT_AMBULATORY_CARE_PROVIDER_SITE_OTHER): Payer: Medicare Other | Admitting: Infectious Diseases

## 2015-10-22 ENCOUNTER — Encounter: Payer: Self-pay | Admitting: Infectious Diseases

## 2015-10-22 ENCOUNTER — Ambulatory Visit: Payer: Medicare Other

## 2015-10-22 VITALS — BP 138/89 | HR 93 | Temp 97.7°F | Ht 71.0 in | Wt 219.0 lb

## 2015-10-22 DIAGNOSIS — Z789 Other specified health status: Secondary | ICD-10-CM

## 2015-10-22 DIAGNOSIS — N289 Disorder of kidney and ureter, unspecified: Secondary | ICD-10-CM | POA: Diagnosis not present

## 2015-10-22 DIAGNOSIS — A539 Syphilis, unspecified: Secondary | ICD-10-CM

## 2015-10-22 DIAGNOSIS — B20 Human immunodeficiency virus [HIV] disease: Secondary | ICD-10-CM

## 2015-10-22 DIAGNOSIS — Z79899 Other long term (current) drug therapy: Secondary | ICD-10-CM

## 2015-10-22 DIAGNOSIS — Z113 Encounter for screening for infections with a predominantly sexual mode of transmission: Secondary | ICD-10-CM

## 2015-10-22 NOTE — Assessment & Plan Note (Signed)
Most recent RPR stable.

## 2015-10-22 NOTE — Assessment & Plan Note (Signed)
Cr is fairly stable.  Will continue to watch.

## 2015-10-22 NOTE — Progress Notes (Signed)
   Subjective:    Patient ID: Max Ray, male    DOB: [birth year], 55 y.o.   MRN: [id number]  HPI 55 yo M with hx of HIV+, HTN and non-compliance. Is on complera --> odefsy. Is Hep B SAb+. He has been seen at [facility] since last visit and then sent to GI for abn CT scan (recatal inflammation). He was found to have condyloma and rectal ulcer, bx sent (benign), and he was sent to surgery. His condyloma was frozen off.  Has been feeling well. Is on odefsy. No further anal lesions. Has not had surgical f/u.  Has gotten flu shot.  Has had no further cough on losaratan.  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   HIV 1 RNA Quant (copies/mL)  Date Value  10/08/2015 <20  05/12/2015 <20  10/23/2014 <20   CD4 T Cell Abs (/uL)  Date Value  10/08/2015 1,370  05/12/2015 910  10/23/2014 810   Wants to lose wt- has been going to gym. Fewer sodas.   Wearing seat belt.   Review of Systems  Constitutional: Negative for appetite change and unexpected weight change.  HENT: Positive for postnasal drip and sinus pressure.   Respiratory: Negative for shortness of breath.   Cardiovascular: Negative for chest pain.  Gastrointestinal: Negative for blood in stool and constipation.  Genitourinary: Negative for difficulty urinating.  Neurological: Negative for headaches.       Objective:   Physical Exam  Constitutional: He appears well-developed and well-nourished.  HENT:  Mouth/Throat: No oropharyngeal exudate.  Eyes: EOM are normal. Pupils are equal, round, and reactive to light.  Neck: Neck supple.  Cardiovascular: Normal rate, regular rhythm and normal heart sounds.   Pulmonary/Chest: Effort normal and breath sounds normal.  Abdominal: Soft. Bowel sounds are normal. There is no tenderness. There is no rebound.  Musculoskeletal: He exhibits no edema.  Lymphadenopathy:    He has no cervical adenopathy.      Assessment & Plan:

## 2015-10-22 NOTE — Assessment & Plan Note (Signed)
Taking ART with food.  Has gotten flu shot.  Given condoms Will see him back in 6 months.

## 2015-10-26 ENCOUNTER — Encounter: Payer: Self-pay | Admitting: Infectious Diseases

## 2015-11-23 ENCOUNTER — Other Ambulatory Visit: Payer: Self-pay | Admitting: Infectious Diseases

## 2015-11-23 DIAGNOSIS — B20 Human immunodeficiency virus [HIV] disease: Secondary | ICD-10-CM

## 2015-12-02 ENCOUNTER — Telehealth: Payer: Self-pay | Admitting: *Deleted

## 2015-12-02 NOTE — Telephone Encounter (Signed)
Patient called stating, "I believe I have a sinus infection and will most likely need antibiotics". He is c/o sinus pain and headache. He is requesting an appt for preferably this week. Unfortunately no available appts for the rest of this month. Advised patient to call Family Medicine who is listed as his PCP and he stated he no longer goes there. Suggested he be evaluated at Urgent Care and he agreed to this plan. Myrtis Hopping CMA

## 2016-01-22 ENCOUNTER — Encounter: Payer: Self-pay | Admitting: *Deleted

## 2016-02-08 ENCOUNTER — Encounter: Payer: Self-pay | Admitting: Internal Medicine

## 2016-04-27 ENCOUNTER — Encounter: Payer: Self-pay | Admitting: Infectious Diseases

## 2016-04-27 ENCOUNTER — Ambulatory Visit (INDEPENDENT_AMBULATORY_CARE_PROVIDER_SITE_OTHER): Payer: Medicare Other | Admitting: Infectious Diseases

## 2016-04-27 ENCOUNTER — Other Ambulatory Visit (HOSPITAL_COMMUNITY)
Admission: RE | Admit: 2016-04-27 | Discharge: 2016-04-27 | Disposition: A | Discharge status: Home / Self Care | Payer: Medicare Other | Source: Ambulatory Visit | Attending: Infectious Diseases | Admitting: Infectious Diseases

## 2016-04-27 VITALS — BP 142/97 | HR 80 | Temp 98.2°F | Ht 71.0 in | Wt 203.0 lb

## 2016-04-27 DIAGNOSIS — J309 Allergic rhinitis, unspecified: Secondary | ICD-10-CM

## 2016-04-27 DIAGNOSIS — N400 Enlarged prostate without lower urinary tract symptoms: Secondary | ICD-10-CM | POA: Diagnosis not present

## 2016-04-27 DIAGNOSIS — Z113 Encounter for screening for infections with a predominantly sexual mode of transmission: Secondary | ICD-10-CM

## 2016-04-27 DIAGNOSIS — Z79899 Other long term (current) drug therapy: Secondary | ICD-10-CM | POA: Diagnosis not present

## 2016-04-27 DIAGNOSIS — B20 Human immunodeficiency virus [HIV] disease: Secondary | ICD-10-CM

## 2016-04-27 DIAGNOSIS — I10 Essential (primary) hypertension: Secondary | ICD-10-CM | POA: Diagnosis not present

## 2016-04-27 LAB — CBC WITH DIFFERENTIAL/PLATELET
BASOS PCT: 0 %
Basophils Absolute: 0 cells/uL (ref 0–200)
EOS PCT: 6 %
Eosinophils Absolute: 486 cells/uL (ref 15–500)
HCT: 47 % (ref 38.5–50.0)
Hemoglobin: 15.8 g/dL (ref 13.2–17.1)
LYMPHS PCT: 31 %
Lymphs Abs: 2511 cells/uL (ref 850–3900)
MCH: 32.6 pg (ref 27.0–33.0)
MCHC: 33.6 g/dL (ref 32.0–36.0)
MCV: 96.9 fL (ref 80.0–100.0)
MONOS PCT: 6 %
MPV: 10.3 fL (ref 7.5–12.5)
Monocytes Absolute: 486 cells/uL (ref 200–950)
Neutro Abs: 4617 cells/uL (ref 1500–7800)
Neutrophils Relative %: 57 %
PLATELETS: 276 10*3/uL (ref 140–400)
RBC: 4.85 MIL/uL (ref 4.20–5.80)
RDW: 12.6 % (ref 11.0–15.0)
WBC: 8.1 10*3/uL (ref 3.8–10.8)

## 2016-04-27 LAB — COMPREHENSIVE METABOLIC PANEL
ALK PHOS: 69 U/L (ref 40–115)
ALT: 19 U/L (ref 9–46)
AST: 26 U/L (ref 10–35)
Albumin: 4.5 g/dL (ref 3.6–5.1)
BILIRUBIN TOTAL: 0.5 mg/dL (ref 0.2–1.2)
BUN: 18 mg/dL (ref 7–25)
CO2: 28 mmol/L (ref 20–31)
Calcium: 10.1 mg/dL (ref 8.6–10.3)
Chloride: 100 mmol/L (ref 98–110)
Creat: 1.35 mg/dL — ABNORMAL HIGH (ref 0.70–1.33)
GLUCOSE: 103 mg/dL — AB (ref 65–99)
POTASSIUM: 5.2 mmol/L (ref 3.5–5.3)
Sodium: 136 mmol/L (ref 135–146)
Total Protein: 8.4 g/dL — ABNORMAL HIGH (ref 6.1–8.1)

## 2016-04-27 LAB — LIPID PANEL
CHOL/HDL RATIO: 2.9 ratio (ref <5.0)
Cholesterol: 168 mg/dL (ref <200)
HDL: 57 mg/dL (ref >40)
LDL Cholesterol: 90 mg/dL (ref <100)
Triglycerides: 103 mg/dL (ref <150)
VLDL: 21 mg/dL (ref <30)

## 2016-04-27 NOTE — Assessment & Plan Note (Signed)
Will check his labs today Offered/refused condoms.  Aged out of [employer].  Will see him back in 6 months

## 2016-04-27 NOTE — Assessment & Plan Note (Signed)
Will check his labs today.  Send him to [facility]

## 2016-04-27 NOTE — Assessment & Plan Note (Signed)
His BP is high in the DBP Will continue his ARB

## 2016-04-27 NOTE — Progress Notes (Signed)
   Subjective:    Patient ID: Max Ray, male    DOB: 24-Feb-1960, 56 y.o.   MRN: [id number]  HPI 56 yo M with hx of HIV+, HTN and non-compliance. Is on complera --> odefsy. Is Hep B SAb+.He also has a hx of rectal warts (has been seen by surgery).  Has been having rouble with allergies, sinus fullness.  Has been using claritin,  flonase, afrin.  Has been minimizing his outdoor time  Denies missed ART.  Taking losartan as well.  Feels like his prostate is getting worse.   HIV 1 RNA Quant (copies/mL)  Date Value  10/08/2015 <20  05/12/2015 <20  10/23/2014 <20   CD4 T Cell Abs (/uL)  Date Value  10/08/2015 1,370  05/12/2015 910  10/23/2014 810    Review of Systems  Constitutional: Negative for appetite change and unexpected weight change.  HENT: Positive for sinus pain, sinus pressure and sneezing.   Respiratory: Negative for cough.   Cardiovascular: Negative for leg swelling.  Gastrointestinal: Negative for constipation and diarrhea.  Genitourinary: Negative for difficulty urinating.       Objective:   Physical Exam  Constitutional: He appears well-developed and well-nourished.  HENT:  Mouth/Throat: No oropharyngeal exudate.  Eyes: EOM are normal. Pupils are equal, round, and reactive to light.  Neck: Neck supple.  Cardiovascular: Normal rate, regular rhythm and normal heart sounds.   Pulmonary/Chest: Effort normal and breath sounds normal.  Abdominal: Soft. Bowel sounds are normal. There is no tenderness. There is no rebound.  Musculoskeletal: He exhibits no edema.  Lymphadenopathy:    He has no cervical adenopathy.      Assessment & Plan:

## 2016-04-27 NOTE — Assessment & Plan Note (Signed)
Will have him seen by allergy.  

## 2016-04-28 ENCOUNTER — Telehealth: Payer: Self-pay | Admitting: *Deleted

## 2016-04-28 ENCOUNTER — Ambulatory Visit (INDEPENDENT_AMBULATORY_CARE_PROVIDER_SITE_OTHER): Payer: Medicare Other | Admitting: *Deleted

## 2016-04-28 DIAGNOSIS — A539 Syphilis, unspecified: Secondary | ICD-10-CM

## 2016-04-28 LAB — RPR: RPR Ser Ql: REACTIVE — AB

## 2016-04-28 LAB — FLUORESCENT TREPONEMAL AB(FTA)-IGG-BLD: FLUORESCENT TREPONEMAL ABS: REACTIVE — AB

## 2016-04-28 LAB — RPR TITER: RPR Titer: 1:32 {titer} — AB

## 2016-04-28 LAB — URINE CYTOLOGY ANCILLARY ONLY
Chlamydia: NEGATIVE
NEISSERIA GONORRHEA: NEGATIVE

## 2016-04-28 LAB — T-HELPER CELL (CD4) - (RCID CLINIC ONLY)
CD4 T CELL ABS: 1000 /uL (ref 400–2700)
CD4 T CELL HELPER: 33 % (ref 33–55)

## 2016-04-28 MED ORDER — PENICILLIN G BENZATHINE 1200000 UNIT/2ML IM SUSP
1.2000 10*6.[IU] | Freq: Once | INTRAMUSCULAR | Status: AC
  Administered 2016-04-28: 1.2 10*6.[IU] via INTRAMUSCULAR

## 2016-04-28 NOTE — Telephone Encounter (Signed)
Patient notified and will come in today for treatment. Myrtis Hopping CMA

## 2016-04-28 NOTE — Telephone Encounter (Signed)
-----   Message from Campbell Riches, MD sent at 04/28/2016  9:13 AM EDT ----- Needs IM benzathine Pen G 2.4 million units x1 thanks

## 2016-04-29 ENCOUNTER — Encounter: Payer: Self-pay | Admitting: Infectious Diseases

## 2016-04-29 LAB — HIV-1 RNA QUANT-NO REFLEX-BLD
HIV 1 RNA Quant: <20 copies/mL
HIV-1 RNA Quant, Log: <1.3 Log copies/mL

## 2016-05-10 ENCOUNTER — Other Ambulatory Visit: Payer: Self-pay | Admitting: Infectious Diseases

## 2016-05-10 DIAGNOSIS — B20 Human immunodeficiency virus [HIV] disease: Secondary | ICD-10-CM

## 2016-05-28 ENCOUNTER — Other Ambulatory Visit: Payer: Self-pay | Admitting: Infectious Diseases

## 2016-05-28 DIAGNOSIS — I10 Essential (primary) hypertension: Secondary | ICD-10-CM

## 2016-06-29 ENCOUNTER — Ambulatory Visit: Payer: Self-pay | Admitting: Allergy and Immunology

## 2016-07-21 ENCOUNTER — Ambulatory Visit: Payer: Self-pay | Admitting: Allergy & Immunology

## 2016-08-22 ENCOUNTER — Telehealth: Payer: Self-pay

## 2016-08-22 NOTE — Telephone Encounter (Signed)
[facility] is calling.   Patient is on the other line and is requesting an appointment.  They have no history of office visit.  The patient is insisting he was there for a visit and recieved an injection in his knee.  [facility] is requesting we check to see if a referral has ever been given to their office for this patient.  There is no referral in EPIC for orthopedics.   Patient walked into clinic earlier this morning stating his knee was swollen and requested referral.  He told the CMA he had previous appointments with [facility] .  He was told to call that office . We had no idea of his referral.   I explained the above to the [facility] orthopedic.  Laverle Patter, RN

## 2016-08-23 ENCOUNTER — Ambulatory Visit (INDEPENDENT_AMBULATORY_CARE_PROVIDER_SITE_OTHER): Payer: Medicare Other

## 2016-08-23 ENCOUNTER — Ambulatory Visit (INDEPENDENT_AMBULATORY_CARE_PROVIDER_SITE_OTHER): Payer: Medicare Other | Admitting: Orthopaedic Surgery

## 2016-08-23 ENCOUNTER — Encounter (INDEPENDENT_AMBULATORY_CARE_PROVIDER_SITE_OTHER): Payer: Self-pay | Admitting: Orthopaedic Surgery

## 2016-08-23 DIAGNOSIS — M25561 Pain in right knee: Secondary | ICD-10-CM

## 2016-08-23 DIAGNOSIS — G8929 Other chronic pain: Secondary | ICD-10-CM

## 2016-08-23 DIAGNOSIS — M1711 Unilateral primary osteoarthritis, right knee: Secondary | ICD-10-CM | POA: Diagnosis not present

## 2016-08-23 NOTE — Progress Notes (Signed)
Office Visit Note   Patient: Max Ray           Date of Birth: 17-Feb-1960           MRN: [id number] Visit Date: 08/23/2016              Requested by: No referring provider defined for this encounter. PCP: Patient, No Pcp Per   Assessment & Plan: Visit Diagnoses:  1. Chronic pain of right knee   2. Unilateral primary osteoarthritis, right knee     Plan: Overall impression is osteoarthritis flareup of the right knee. 80 mL of joint fluid was aspirated and cortisone was injected. Patient tolerates well. We will be in touch with the patient regarding the fluid results.  Follow-Up Instructions: Return if symptoms worsen or fail to improve.   Orders:  Orders Placed This Encounter  Procedures  . XR KNEE 3 VIEW RIGHT  . Cell count + diff,  w/ cryst-synvl fld   No orders of the defined types were placed in this encounter.     Procedures: No procedures performed   Clinical Data: No additional findings.   Subjective: Chief Complaint  Patient presents with  . Right Knee - Pain    Max Ray comes in today for right knee pain and effusion. He states this has been going on for several months. This is recently gotten worse. He is having trouble with horseback riding and flexion of his knee. He denies any indwelling. He has been wrapping it with an Ace bandage and ice. Ibuprofen helps some.    Review of Systems  Constitutional: Negative.   All other systems reviewed and are negative.    Objective: Vital Signs: There were no vitals taken for this visit.  Physical Exam  Constitutional: He is oriented to person, place, and time. He appears well-developed and well-nourished.  Pulmonary/Chest: Effort normal.  Abdominal: Soft.  Neurological: He is alert and oriented to person, place, and time.  Skin: Skin is warm.  Psychiatric: He has a normal mood and affect. His behavior is normal. Judgment and thought content normal.  Nursing note and vitals reviewed.   Ortho  Exam Right knee exam shows a large effusion. Collaterals and cruciate's are stable. Specialty Comments:  No specialty comments available.  Imaging: No results found.   PMFS History: Patient Active Problem List   Diagnosis Date Noted  . Prostate hypertrophy 04/27/2016  . Hepatitis B immune 10/22/2015  . Abnormal CT scan, colon 03/25/2015  . Sinusitis, chronic 04/08/2014  . IBS (irritable bowel syndrome) 04/08/2014  . Pseudofolliculitis barbae 14/97/0263  . Erectile dysfunction 03/15/2012  . Renal insufficiency 06/22/2011  . Microalbuminuria 06/22/2011  . Palate mass 04/27/2011  . TIA (transient ischemic attack) 03/22/2011  . HTN (hypertension) 03/22/2011  . Allergic rhinitis 03/22/2011  . PTSD (post-traumatic stress disorder) 03/22/2011  . Hypogonadism male 03/22/2011  . Macrocytic anemia 03/22/2011  . Anxiety 03/22/2011  . Periodontal disease 02/14/2011  . TINEA CORPORIS 01/05/2009  . SKIN RASH 10/08/2008  . SORE THROAT 01/07/2008  . Syphilis 03/09/2007  . GLUCOSE-6-PHOSPHATE DEHYDROGENASE DEFICIENCY 03/09/2007  . Allergic sinusitis 01/24/2007  . BACK PAIN, LUMBAR 01/24/2007  . PRESBYOPIA 06/19/2006  . THROMBOCYTOPENIA 05/27/2006  . Human immunodeficiency virus (HIV) disease ([place]) 10/04/2005  . GENITAL HERPES 10/04/2005   Past Medical History:  Diagnosis Date  . Anxiety   . HIV disease ([place])   . Hypertension   . Pneumonia   . Syphilis     Family History  Problem Relation  Age of Onset  . Breast cancer Paternal Aunt        x 3  . Pancreatic cancer Maternal Grandfather   . Other Father        prostate disease    Past Surgical History:  Procedure Laterality Date  . KNEE ARTHROSCOPY Right    Social History   Occupational History  . disabled    Social History Main Topics  . Smoking status: Never Smoker  . Smokeless tobacco: Never Used  . Alcohol use 0.6 oz/week    1 Standard drinks or equivalent per week     Comment: social  . Drug use: No  . Sexual  activity: Yes    Partners: Male    Birth control/ protection: Condom     Comment: pt. given condoms

## 2016-08-24 ENCOUNTER — Telehealth (INDEPENDENT_AMBULATORY_CARE_PROVIDER_SITE_OTHER): Payer: Self-pay

## 2016-08-24 LAB — SYNOVIAL CELL COUNT + DIFF, W/ CRYSTALS
BASOPHILS, %: 0 %
Eosinophils-Synovial: 0 % (ref 0–2)
Lymphocytes-Synovial Fld: 7 % (ref 0–74)
Monocyte/Macrophage: 88 % — ABNORMAL HIGH (ref 0–69)
Neutrophil, Synovial: 5 % (ref 0–24)
Synoviocytes, %: 0 % (ref 0–15)
WBC, Synovial: 116 cells/uL (ref <150)

## 2016-08-24 NOTE — Telephone Encounter (Signed)
Called PT no answer  per Dr Erlinda Hong just Wanted to let him know fluid results came back and it showed he had Arthritis flare up.

## 2016-08-24 NOTE — Progress Notes (Signed)
Arthritis flare up

## 2016-10-06 ENCOUNTER — Other Ambulatory Visit: Payer: Self-pay | Admitting: Infectious Diseases

## 2016-10-06 DIAGNOSIS — B20 Human immunodeficiency virus [HIV] disease: Secondary | ICD-10-CM

## 2016-11-08 ENCOUNTER — Other Ambulatory Visit (HOSPITAL_COMMUNITY)
Admission: RE | Admit: 2016-11-08 | Discharge: 2016-11-08 | Disposition: A | Discharge status: Home / Self Care | Payer: Medicare Other | Source: Ambulatory Visit | Attending: Infectious Diseases | Admitting: Infectious Diseases

## 2016-11-08 ENCOUNTER — Ambulatory Visit (INDEPENDENT_AMBULATORY_CARE_PROVIDER_SITE_OTHER): Payer: Medicare Other | Admitting: Infectious Diseases

## 2016-11-08 ENCOUNTER — Encounter: Payer: Self-pay | Admitting: Infectious Diseases

## 2016-11-08 VITALS — BP 161/101 | HR 87 | Temp 98.7°F | Wt 208.0 lb

## 2016-11-08 DIAGNOSIS — N529 Male erectile dysfunction, unspecified: Secondary | ICD-10-CM

## 2016-11-08 DIAGNOSIS — Z79899 Other long term (current) drug therapy: Secondary | ICD-10-CM

## 2016-11-08 DIAGNOSIS — M1711 Unilateral primary osteoarthritis, right knee: Secondary | ICD-10-CM | POA: Diagnosis not present

## 2016-11-08 DIAGNOSIS — B360 Pityriasis versicolor: Secondary | ICD-10-CM | POA: Diagnosis not present

## 2016-11-08 DIAGNOSIS — N289 Disorder of kidney and ureter, unspecified: Secondary | ICD-10-CM | POA: Diagnosis not present

## 2016-11-08 DIAGNOSIS — B20 Human immunodeficiency virus [HIV] disease: Secondary | ICD-10-CM

## 2016-11-08 DIAGNOSIS — I10 Essential (primary) hypertension: Secondary | ICD-10-CM

## 2016-11-08 DIAGNOSIS — E291 Testicular hypofunction: Secondary | ICD-10-CM | POA: Diagnosis not present

## 2016-11-08 DIAGNOSIS — Z113 Encounter for screening for infections with a predominantly sexual mode of transmission: Secondary | ICD-10-CM

## 2016-11-08 MED ORDER — FLUCONAZOLE 100 MG PO TABS: 300.0000 mg | ORAL_TABLET | ORAL | 0 refills | Status: DC

## 2016-11-08 MED ORDER — SILDENAFIL CITRATE 100 MG PO TABS: 100.0000 mg | ORAL_TABLET | Freq: Every day | ORAL | 1 refills | Status: DC | PRN

## 2016-11-08 NOTE — Assessment & Plan Note (Signed)
Will check his labs today Has had flu (10-03-16) Given condoms Will see him back in 9 months

## 2016-11-08 NOTE — Progress Notes (Signed)
   Subjective:    Patient ID: Max Ray, male    DOB: 09-01-1960, 56 y.o.   MRN: [id number]  HPI 56 yo M with hx of HIV+, HTN and non-compliance. Is on complera --> odefsy. Marland KitchenHe also has a hx of rectal warts (has been seen by surgery).   Worried about a rash that he has on his trunk.  Been doing well with odefsy.  BP very high today-  Has not eatne all day. Works 3rd shift.   HIV 1 RNA Quant (copies/mL)  Date Value  04/27/2016 <20 NOT DETECTED  10/08/2015 <20  05/12/2015 <20   CD4 T Cell Abs (/uL)  Date Value  04/27/2016 1,000  10/08/2015 1,370  05/12/2015 910    Review of Systems  Constitutional: Negative for appetite change, chills, fever and unexpected weight change.  Respiratory: Negative for cough and shortness of breath.   Gastrointestinal: Negative for constipation and diarrhea.  Genitourinary: Positive for frequency. Negative for difficulty urinating.  Skin: Positive for color change and rash.  Psychiatric/Behavioral: Positive for sleep disturbance.  taking super beta prostate 3- overactive bladder. Interrupts sleep. Has seen uro but like all natural rx.  Please see HPI. All other systems reviewed and negative.     Objective:   Physical Exam  Constitutional: He appears well-developed and well-nourished.  HENT:  Mouth/Throat: No oropharyngeal exudate.  Eyes: EOM are normal. Pupils are equal, round, and reactive to light.  Neck: Neck supple.  Cardiovascular: Normal rate, regular rhythm and normal heart sounds.  Pulmonary/Chest: Effort normal and breath sounds normal.  Abdominal: Soft. Bowel sounds are normal. There is no tenderness. There is no rebound.  Musculoskeletal: He exhibits no edema.       Legs: Lymphadenopathy:    He has no cervical adenopathy.  Skin: Rash noted.          Assessment & Plan:

## 2016-11-08 NOTE — Assessment & Plan Note (Addendum)
Will recheck May need to increase ARB.  Asked him to cut back on Mtn Dew (1 L /day is his avg).

## 2016-11-08 NOTE — Assessment & Plan Note (Signed)
Slightly worse Due to ARB? HTN?

## 2016-11-08 NOTE — Assessment & Plan Note (Signed)
Will give him rx for anti-fungal.

## 2016-11-08 NOTE — Assessment & Plan Note (Signed)
rx for viagra

## 2016-11-08 NOTE — Assessment & Plan Note (Signed)
Do not have access to his film. His fluid was unremarkable.  Prn tylenol.  Limit use of NSAID with renal dysfunction.

## 2016-11-08 NOTE — Assessment & Plan Note (Signed)
Will write him rx for viagra.  

## 2016-11-09 ENCOUNTER — Encounter: Payer: Self-pay | Admitting: Infectious Diseases

## 2016-11-09 LAB — CBC
HCT: 46.3 % (ref 38.5–50.0)
Hemoglobin: 16.2 g/dL (ref 13.2–17.1)
MCH: 33.1 pg — AB (ref 27.0–33.0)
MCHC: 35 g/dL (ref 32.0–36.0)
MCV: 94.5 fL (ref 80.0–100.0)
MPV: 10.3 fL (ref 7.5–12.5)
PLATELETS: 254 10*3/uL (ref 140–400)
RBC: 4.9 10*6/uL (ref 4.20–5.80)
RDW: 11.5 % (ref 11.0–15.0)
WBC: 10.4 10*3/uL (ref 3.8–10.8)

## 2016-11-09 LAB — LIPID PANEL
CHOL/HDL RATIO: 3.1 (calc) (ref <5.0)
Cholesterol: 191 mg/dL (ref <200)
HDL: 62 mg/dL (ref >40)
LDL CHOLESTEROL (CALC): 101 mg/dL — AB
Non-HDL Cholesterol (Calc): 129 mg/dL (calc) (ref <130)
Triglycerides: 184 mg/dL — ABNORMAL HIGH (ref <150)

## 2016-11-09 LAB — COMPREHENSIVE METABOLIC PANEL
AG Ratio: 1.2 (calc) (ref 1.0–2.5)
ALT: 17 U/L (ref 9–46)
AST: 22 U/L (ref 10–35)
Albumin: 4.4 g/dL (ref 3.6–5.1)
Alkaline phosphatase (APISO): 82 U/L (ref 40–115)
BUN: 8 mg/dL (ref 7–25)
CALCIUM: 9.8 mg/dL (ref 8.6–10.3)
CO2: 28 mmol/L (ref 20–32)
CREATININE: 1.29 mg/dL (ref 0.70–1.33)
Chloride: 102 mmol/L (ref 98–110)
GLUCOSE: 94 mg/dL (ref 65–99)
Globulin: 3.7 g/dL (calc) (ref 1.9–3.7)
Potassium: 3.9 mmol/L (ref 3.5–5.3)
SODIUM: 139 mmol/L (ref 135–146)
TOTAL PROTEIN: 8.1 g/dL (ref 6.1–8.1)
Total Bilirubin: 1.6 mg/dL — ABNORMAL HIGH (ref 0.2–1.2)

## 2016-11-09 LAB — RPR TITER

## 2016-11-09 LAB — RPR: RPR: REACTIVE — AB

## 2016-11-09 LAB — FLUORESCENT TREPONEMAL AB(FTA)-IGG-BLD: FLUORESCENT TREPONEMAL ABS: REACTIVE — AB

## 2016-11-09 LAB — T-HELPER CELL (CD4) - (RCID CLINIC ONLY)
CD4 % Helper T Cell: 41 % (ref 33–55)
CD4 T Cell Abs: 1320 /uL (ref 400–2700)

## 2016-11-10 LAB — URINE CYTOLOGY ANCILLARY ONLY
CHLAMYDIA, DNA PROBE: NEGATIVE
Neisseria Gonorrhea: NEGATIVE

## 2016-11-10 LAB — HIV-1 RNA QUANT-NO REFLEX-BLD
HIV 1 RNA Quant: <20 copies/mL — AB
HIV-1 RNA Quant, Log: <1.3 Log copies/mL — AB

## 2016-11-26 ENCOUNTER — Other Ambulatory Visit: Payer: Self-pay | Admitting: Infectious Diseases

## 2016-11-26 DIAGNOSIS — I10 Essential (primary) hypertension: Secondary | ICD-10-CM

## 2016-12-08 ENCOUNTER — Telehealth: Payer: Self-pay | Admitting: *Deleted

## 2016-12-08 NOTE — Telephone Encounter (Signed)
Patient left message in triage, concerned that his skin condition has not yet cleared up. He was calling for advice, wanted to know if he eneded a different medication. RN attempted to return the call, no answer, no voicemail set up. Call was disconnected. Landis Gandy, RN

## 2016-12-15 ENCOUNTER — Other Ambulatory Visit: Payer: Self-pay | Admitting: *Deleted

## 2016-12-15 DIAGNOSIS — B360 Pityriasis versicolor: Secondary | ICD-10-CM

## 2016-12-15 MED ORDER — FLUCONAZOLE 100 MG PO TABS: 300.0000 mg | ORAL_TABLET | ORAL | 0 refills | Status: DC

## 2016-12-15 NOTE — Telephone Encounter (Signed)
Patient called again regarding the diflucan for his skin. He wants to know if he should continue these and if so, he will need refills. Please advise. Patient wants to use [facility] on [address] in [place].

## 2016-12-15 NOTE — Telephone Encounter (Signed)
Refilled. Patient notified.

## 2016-12-15 NOTE — Telephone Encounter (Signed)
Please refill once if not improved.

## 2017-01-19 ENCOUNTER — Telehealth: Payer: Self-pay | Admitting: *Deleted

## 2017-01-19 DIAGNOSIS — B360 Pityriasis versicolor: Secondary | ICD-10-CM

## 2017-01-19 MED ORDER — FLUCONAZOLE 100 MG PO TABS: 300.0000 mg | ORAL_TABLET | ORAL | 0 refills | Status: DC

## 2017-01-19 NOTE — Telephone Encounter (Signed)
Walk-in to clinic - Tinea Versicolor.  Rash is not clear yet.  Patient requesting refill for Fluconazole.  RN received verbal order from Dr. Johnnye Sima, Fluconazole 100 mg tablet, take 3 tablets by mouth weekly for 4 weeks, # 12, no refills.

## 2017-01-25 ENCOUNTER — Encounter: Payer: Self-pay | Admitting: Infectious Diseases

## 2017-03-10 ENCOUNTER — Other Ambulatory Visit: Payer: Self-pay | Admitting: *Deleted

## 2017-03-10 DIAGNOSIS — B360 Pityriasis versicolor: Secondary | ICD-10-CM

## 2017-03-10 MED ORDER — FLUCONAZOLE 100 MG PO TABS: 300.0000 mg | ORAL_TABLET | ORAL | 0 refills | Status: DC

## 2017-04-21 ENCOUNTER — Other Ambulatory Visit: Payer: Self-pay | Admitting: Internal Medicine

## 2017-04-21 DIAGNOSIS — B20 Human immunodeficiency virus [HIV] disease: Secondary | ICD-10-CM

## 2017-05-09 ENCOUNTER — Telehealth: Payer: Self-pay

## 2017-05-09 NOTE — Telephone Encounter (Signed)
Thank you for IM eval

## 2017-05-09 NOTE — Telephone Encounter (Signed)
Pt called today stating that he would like for Dr. Johnnye Sima to adjust his BP medicine stated that his BP was elevated at a church even and was told to f/u with his Md. Pt stated that he was interested in trying a "water pill for BP". PT stated also during his call that he was wanting to go over his previous HIV meds. Due to concerns of bone density. PT would like to know how he can check his bone density. Asked the pt if he would like for [place] to help him establish Primary Care since they would be better suited to monitor his BP. Pt stated that he would be interested, will reach out to internal medicine to help pt get an appt. Pt is currently schedule to see Dr. Johnnye Sima at 05/18/17 at 2:45. [place]

## 2017-05-11 ENCOUNTER — Telehealth: Payer: Self-pay | Admitting: Infectious Diseases

## 2017-05-11 NOTE — Telephone Encounter (Signed)
Called patient to inform him of his appointment with Internal Medicine. Call went straight to automatic message that says that voicemail has not been set up yet.

## 2017-05-18 ENCOUNTER — Other Ambulatory Visit: Payer: Self-pay | Admitting: Internal Medicine

## 2017-05-18 ENCOUNTER — Ambulatory Visit (INDEPENDENT_AMBULATORY_CARE_PROVIDER_SITE_OTHER): Payer: Medicare Other | Admitting: Infectious Diseases

## 2017-05-18 ENCOUNTER — Other Ambulatory Visit (HOSPITAL_COMMUNITY)
Admission: RE | Admit: 2017-05-18 | Discharge: 2017-05-18 | Disposition: A | Discharge status: Home / Self Care | Payer: Medicare Other | Source: Ambulatory Visit | Attending: Infectious Diseases | Admitting: Infectious Diseases

## 2017-05-18 VITALS — BP 132/93 | HR 116 | Temp 98.1°F | Wt 216.0 lb

## 2017-05-18 DIAGNOSIS — I10 Essential (primary) hypertension: Secondary | ICD-10-CM | POA: Diagnosis not present

## 2017-05-18 DIAGNOSIS — Z79899 Other long term (current) drug therapy: Secondary | ICD-10-CM | POA: Diagnosis not present

## 2017-05-18 DIAGNOSIS — B20 Human immunodeficiency virus [HIV] disease: Secondary | ICD-10-CM

## 2017-05-18 DIAGNOSIS — Z113 Encounter for screening for infections with a predominantly sexual mode of transmission: Secondary | ICD-10-CM | POA: Diagnosis present

## 2017-05-18 DIAGNOSIS — F4310 Post-traumatic stress disorder, unspecified: Secondary | ICD-10-CM

## 2017-05-18 DIAGNOSIS — K056 Periodontal disease, unspecified: Secondary | ICD-10-CM | POA: Diagnosis not present

## 2017-05-18 MED ORDER — LOSARTAN POTASSIUM 50 MG PO TABS: 50.0000 mg | ORAL_TABLET | Freq: Every day | ORAL | 3 refills | Status: DC
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
## 2017-05-18 NOTE — Progress Notes (Signed)
   Subjective:    Patient ID: Max Ray, male    DOB: 02/15/1960, 57 y.o.   MRN: [id number]  HPI 57yo M with hx of HIV+, HTN and non-compliance. Is on complera -->odefsy. Marland KitchenHe also has a hx of rectal warts (has been seen by surgery).  He is on ARB for BP (well controlled today). He was seen at health fair and his 152/110. He has doubled up his ARB on his own.   He has gained 13# in last year.  He has f/u appt at IM on 5-20.  Is worried about being on truvada (and loss of bone mineral density). He has engaged legal services for this. Interested in getting bone density done.   HIV 1 RNA Quant (copies/mL)  Date Value  11/08/2016 <20 DETECTED (A)  04/27/2016 <20 NOT DETECTED  10/08/2015 <20   CD4 T Cell Abs (/uL)  Date Value  11/08/2016 1,320  04/27/2016 1,000  10/08/2015 1,370    Review of Systems  Constitutional: Negative for appetite change and unexpected weight change.  Respiratory: Negative for shortness of breath.   Gastrointestinal: Negative for constipation and diarrhea.  Genitourinary: Positive for urgency. Negative for difficulty urinating.  Musculoskeletal: Positive for arthralgias and myalgias.  Psychiatric/Behavioral: Positive for sleep disturbance.  "I have major prostate issues...overactive bladder, accidents throughout the week..working on natural alternatives" erectile dysfunction.  Please see HPI. All other systems reviewed and negative.     Objective:   Physical Exam  Constitutional: He appears well-developed and well-nourished.  HENT:  Mouth/Throat: No oropharyngeal exudate.  Eyes: Pupils are equal, round, and reactive to light. EOM are normal.  Neck: Normal range of motion. Neck supple.  Cardiovascular: Normal rate, regular rhythm and normal heart sounds.  Pulmonary/Chest: Effort normal and breath sounds normal.  Abdominal: Soft. Bowel sounds are normal.  Musculoskeletal: He exhibits no edema.          Assessment & Plan:

## 2017-05-18 NOTE — Assessment & Plan Note (Signed)
Will get him condoms Will get him bone density study Will check his labs today Will see him back in 9 months

## 2017-05-18 NOTE — Assessment & Plan Note (Signed)
Will get him in with dental clinic.

## 2017-05-18 NOTE — Assessment & Plan Note (Signed)
Will change his ARB dose to reflect what he is actually taking.

## 2017-05-19 LAB — LIPID PANEL
CHOL/HDL RATIO: 3.7 (calc) (ref <5.0)
Cholesterol: 190 mg/dL (ref <200)
HDL: 51 mg/dL (ref >40)
LDL CHOLESTEROL (CALC): 108 mg/dL — AB
NON-HDL CHOLESTEROL (CALC): 139 mg/dL — AB (ref <130)
Triglycerides: 185 mg/dL — ABNORMAL HIGH (ref <150)

## 2017-05-19 LAB — CBC
HCT: 45 % (ref 38.5–50.0)
Hemoglobin: 16.3 g/dL (ref 13.2–17.1)
MCH: 33.5 pg — ABNORMAL HIGH (ref 27.0–33.0)
MCHC: 36.2 g/dL — ABNORMAL HIGH (ref 32.0–36.0)
MCV: 92.4 fL (ref 80.0–100.0)
MPV: 10 fL (ref 7.5–12.5)
PLATELETS: 286 10*3/uL (ref 140–400)
RBC: 4.87 10*6/uL (ref 4.20–5.80)
RDW: 11.4 % (ref 11.0–15.0)
WBC: 9.9 10*3/uL (ref 3.8–10.8)

## 2017-05-19 LAB — RPR: RPR: REACTIVE — AB

## 2017-05-19 LAB — COMPREHENSIVE METABOLIC PANEL
AG Ratio: 1.2 (calc) (ref 1.0–2.5)
ALBUMIN MSPROF: 4.4 g/dL (ref 3.6–5.1)
ALT: 22 U/L (ref 9–46)
AST: 25 U/L (ref 10–35)
Alkaline phosphatase (APISO): 75 U/L (ref 40–115)
BUN: 11 mg/dL (ref 7–25)
CO2: 27 mmol/L (ref 20–32)
CREATININE: 1.2 mg/dL (ref 0.70–1.33)
Calcium: 9.7 mg/dL (ref 8.6–10.3)
Chloride: 104 mmol/L (ref 98–110)
Globulin: 3.8 g/dL (calc) — ABNORMAL HIGH (ref 1.9–3.7)
Glucose, Bld: 83 mg/dL (ref 65–99)
POTASSIUM: 4 mmol/L (ref 3.5–5.3)
SODIUM: 139 mmol/L (ref 135–146)
TOTAL PROTEIN: 8.2 g/dL — AB (ref 6.1–8.1)
Total Bilirubin: 0.5 mg/dL (ref 0.2–1.2)

## 2017-05-19 LAB — RPR TITER

## 2017-05-19 LAB — URINE CYTOLOGY ANCILLARY ONLY
Chlamydia: NEGATIVE
Neisseria Gonorrhea: NEGATIVE

## 2017-05-19 LAB — FLUORESCENT TREPONEMAL AB(FTA)-IGG-BLD: Fluorescent Treponemal ABS: REACTIVE — AB

## 2017-05-19 LAB — T-HELPER CELL (CD4) - (RCID CLINIC ONLY)
CD4 % Helper T Cell: 37 % (ref 33–55)
CD4 T CELL ABS: 1120 /uL (ref 400–2700)

## 2017-05-22 ENCOUNTER — Other Ambulatory Visit: Payer: Self-pay

## 2017-05-22 ENCOUNTER — Ambulatory Visit (INDEPENDENT_AMBULATORY_CARE_PROVIDER_SITE_OTHER): Payer: Medicare Other | Admitting: Internal Medicine

## 2017-05-22 ENCOUNTER — Encounter: Payer: Self-pay | Admitting: Internal Medicine

## 2017-05-22 VITALS — BP 149/98 | HR 116 | Temp 98.2°F | Ht 70.0 in | Wt 211.3 lb

## 2017-05-22 DIAGNOSIS — N401 Enlarged prostate with lower urinary tract symptoms: Secondary | ICD-10-CM | POA: Diagnosis not present

## 2017-05-22 DIAGNOSIS — R350 Frequency of micturition: Secondary | ICD-10-CM | POA: Diagnosis not present

## 2017-05-22 DIAGNOSIS — N400 Enlarged prostate without lower urinary tract symptoms: Secondary | ICD-10-CM

## 2017-05-22 DIAGNOSIS — I10 Essential (primary) hypertension: Secondary | ICD-10-CM | POA: Diagnosis not present

## 2017-05-22 DIAGNOSIS — B20 Human immunodeficiency virus [HIV] disease: Secondary | ICD-10-CM

## 2017-05-22 DIAGNOSIS — Z79899 Other long term (current) drug therapy: Secondary | ICD-10-CM

## 2017-05-22 LAB — HIV-1 RNA QUANT-NO REFLEX-BLD
HIV 1 RNA Quant: <20 copies/mL
HIV-1 RNA Quant, Log: <1.3 Log copies/mL

## 2017-05-22 MED ORDER — AMLODIPINE BESYLATE 5 MG PO TABS: 5.0000 mg | ORAL_TABLET | Freq: Every day | ORAL | 1 refills | Status: DC

## 2017-05-22 NOTE — Progress Notes (Signed)
   CC: High blood pressure  HPI:  Mr.Max Ray is a 57 y.o. male with HIV who presented to the clinic for evaluation and management of his HTN. For a detailed assessment and management plan please refer to problem based charting below.   Past Medical History:  Diagnosis Date  . Anxiety   . HIV disease ([place])   . Hypertension   . Pneumonia   . Syphilis    Family History  Problem Relation Age of Onset  . Breast cancer Paternal Aunt        x 3  . Pancreatic cancer Maternal Grandfather   . Other Father        prostate disease   Social History   Socioeconomic History  . Marital status: Divorced    Spouse name: Not on file  . Number of children: 3  . Years of education: Not on file  . Highest education level: Not on file  Occupational History  . Occupation: disabled  Social Needs  . Financial resource strain: Not on file  . Food insecurity:    Worry: Not on file    Inability: Not on file  . Transportation needs:    Medical: Not on file    Non-medical: Not on file  Tobacco Use  . Smoking status: Never Smoker  . Smokeless tobacco: Never Used  Substance and Sexual Activity  . Alcohol use: Yes    Alcohol/week: 0.6 oz    Types: 1 Standard drinks or equivalent per week    Comment: social  . Drug use: No  . Sexual activity: Yes    Partners: Male    Birth control/protection: Condom    Comment: pt. given condoms  Lifestyle  . Physical activity:    Days per week: Not on file    Minutes per session: Not on file  . Stress: Not on file  Relationships  . Social connections:    Talks on phone: Not on file    Gets together: Not on file    Attends religious service: Not on file    Active member of club or organization: Not on file    Attends meetings of clubs or organizations: Not on file    Relationship status: Not on file  . Intimate partner violence:    Fear of current or ex partner: Not on file    Emotionally abused: Not on file    Physically abused: Not on file     Forced sexual activity: Not on file  Other Topics Concern  . Not on file  Social History Narrative  . Not on file   Review of Systems:   12 point ROS preformed. All negative aside from those mentioned in the HPI.  Physical Exam: Vitals:   05/22/17 1516  BP: (!) 149/98  Pulse: (!) 116  Temp: 98.2 F (36.8 C)  TempSrc: Oral  SpO2: 96%  Weight: 211 lb 4.8 oz (95.8 kg)  Height: 5\' 10"  (1.778 m)   General: Well nourished male in no acute distress HENT: Normocephalic, atraumatic, moist mucus membranes Pulm: Good air movement with no wheezing or crackles  CV: RRR, no murmurs, no rubs  Abdomen: Active bowel sounds, soft, non-distended, no tenderness to palpation  Extremities: Pulses palpable in all extremities, no LE edema  Skin: Warm and dry  Neuro: Alert and oriented x 3  Assessment & Plan:   See Encounters Tab for problem based charting.  Patient discussed with Dr. Dareen Ray

## 2017-05-22 NOTE — Patient Instructions (Addendum)
Thank you for allowing [place] to provide your care. Today we did the following:  1. Added Amlodipine 5 mg daily   Please come back in 3 months to see your primary care physician.   Managing Your Hypertension Hypertension is commonly called high blood pressure. This is when the force of your blood pressing against the walls of your arteries is too strong. Arteries are blood vessels that carry blood from your heart throughout your body. Hypertension forces the heart to work harder to pump blood, and may cause the arteries to become narrow or stiff. Having untreated or uncontrolled hypertension can cause heart attack, stroke, kidney disease, and other problems. What are blood pressure readings? A blood pressure reading consists of a higher number over a lower number. Ideally, your blood pressure should be below 120/80. The first ("top") number is called the systolic pressure. It is a measure of the pressure in your arteries as your heart beats. The second ("bottom") number is called the diastolic pressure. It is a measure of the pressure in your arteries as the heart relaxes. What does my blood pressure reading mean? Blood pressure is classified into four stages. Based on your blood pressure reading, your health care provider may use the following stages to determine what type of treatment you need, if any. Systolic pressure and diastolic pressure are measured in a unit called mm Hg. Normal  Systolic pressure: below 834.  Diastolic pressure: below 80. Elevated  Systolic pressure: 196-222.  Diastolic pressure: below 80. Hypertension stage 1  Systolic pressure: 979-892.  Diastolic pressure: 11-94. Hypertension stage 2  Systolic pressure: 174 or above.  Diastolic pressure: 90 or above. What health risks are associated with hypertension? Managing your hypertension is an important responsibility. Uncontrolled hypertension can lead to:  A heart attack.  A stroke.  A weakened blood vessel  (aneurysm).  Heart failure.  Kidney damage.  Eye damage.  Metabolic syndrome.  Memory and concentration problems.  What changes can I make to manage my hypertension? Hypertension can be managed by making lifestyle changes and possibly by taking medicines. Your health care provider will help you make a plan to bring your blood pressure within a normal range. Eating and drinking  Eat a diet that is high in fiber and potassium, and low in salt (sodium), added sugar, and fat. An example eating plan is called the DASH (Dietary Approaches to Stop Hypertension) diet. To eat this way: ? Eat plenty of fresh fruits and vegetables. Try to fill half of your plate at each meal with fruits and vegetables. ? Eat whole grains, such as whole wheat pasta, brown rice, or whole grain bread. Fill about one quarter of your plate with whole grains. ? Eat low-fat diary products. ? Avoid fatty cuts of meat, processed or cured meats, and poultry with skin. Fill about one quarter of your plate with lean proteins such as fish, chicken without skin, beans, eggs, and tofu. ? Avoid premade and processed foods. These tend to be higher in sodium, added sugar, and fat.  Reduce your daily sodium intake. Most people with hypertension should eat less than 1,500 mg of sodium a day.  Limit alcohol intake to no more than 1 drink a day for nonpregnant women and 2 drinks a day for men. One drink equals 12 oz of beer, 5 oz of wine, or 1 oz of hard liquor. Lifestyle  Work with your health care provider to maintain a healthy body weight, or to lose weight. Ask what an  ideal weight is for you.  Get at least 30 minutes of exercise that causes your heart to beat faster (aerobic exercise) most days of the week. Activities may include walking, swimming, or biking.  Include exercise to strengthen your muscles (resistance exercise), such as weight lifting, as part of your weekly exercise routine. Try to do these types of exercises for  30 minutes at least 3 days a week.  Do not use any products that contain nicotine or tobacco, such as cigarettes and e-cigarettes. If you need help quitting, ask your health care provider.  Control any long-term (chronic) conditions you have, such as high cholesterol or diabetes. Monitoring  Monitor your blood pressure at home as told by your health care provider. Your personal target blood pressure may vary depending on your medical conditions, your age, and other factors.  Have your blood pressure checked regularly, as often as told by your health care provider. Working with your health care provider  Review all the medicines you take with your health care provider because there may be side effects or interactions.  Talk with your health care provider about your diet, exercise habits, and other lifestyle factors that may be contributing to hypertension.  Visit your health care provider regularly. Your health care provider can help you create and adjust your plan for managing hypertension. Will I need medicine to control my blood pressure? Your health care provider may prescribe medicine if lifestyle changes are not enough to get your blood pressure under control, and if:  Your systolic blood pressure is 130 or higher.  Your diastolic blood pressure is 80 or higher.  Take medicines only as told by your health care provider. Follow the directions carefully. Blood pressure medicines must be taken as prescribed. The medicine does not work as well when you skip doses. Skipping doses also puts you at risk for problems. Contact a health care provider if:  You think you are having a reaction to medicines you have taken.  You have repeated (recurrent) headaches.  You feel dizzy.  You have swelling in your ankles.  You have trouble with your vision. Get help right away if:  You develop a severe headache or confusion.  You have unusual weakness or numbness, or you feel faint.  You  have severe pain in your chest or abdomen.  You vomit repeatedly.  You have trouble breathing. Summary  Hypertension is when the force of blood pumping through your arteries is too strong. If this condition is not controlled, it may put you at risk for serious complications.  Your personal target blood pressure may vary depending on your medical conditions, your age, and other factors. For most people, a normal blood pressure is less than 120/80.  Hypertension is managed by lifestyle changes, medicines, or both. Lifestyle changes include weight loss, eating a healthy, low-sodium diet, exercising more, and limiting alcohol. This information is not intended to replace advice given to you by your health care provider. Make sure you discuss any questions you have with your health care provider. Document Released: 09/14/2011 Document Revised: 11/18/2015 Document Reviewed: 11/18/2015 Elsevier Interactive Patient Education  [employer].

## 2017-05-22 NOTE — Assessment & Plan Note (Signed)
Patient continues to have increased urinary frequency. No changes to medical management at this point.

## 2017-05-22 NOTE — Assessment & Plan Note (Signed)
Patient presents for evaluation and management of his HTN. He states that he has been on HTN medications for several years and was initially on lisinopril but stopped it because of a cough. He then was started on Losartan 25 mg QD. He felt that he was doing well on this medication until approximately 3-4 weeks ago when he when to a health fair after church. There his BP was checked and found to be elevated. He was advised to follow-up with his PCP for better BP control. He doubled his Losartan at that time to 50 mg and has been on this dose for approximately 2 weeks. He does not check his BP at home. He is concerned about CVA and MI. He is unsure if high BP runs in his family. He has gained about 20 lbs over the past 2 years and contributes this to less exercise, more Mountain Dew, and more TV dinners. He denies the use of stimulants and EtOH. He is not using Afrin daily. He denies visual changes, HA, CP, SOB, N/V, abdominal pain, myalgias/arthralgias.   We discussed lifestyle modifications including weight loss, cutting out high sodium meals, and limiting sugary beverages/stimulants. We discussed starting amlodipine vs HCTZ and possible side effects. The patient elected to start amlodipine due to his BPH and concerns that HCTZ would make him use the restroom more.   Plan: - Encouraged lifestyle modifications  - Continue Losartan 50 mg QD - Start Amlodipine 5 mg QD - Follow-up in 3 months.

## 2017-05-23 NOTE — Progress Notes (Signed)
Internal Medicine Clinic Attending  Case discussed with Dr. Helberg at the time of the visit.  We reviewed the resident's history and exam and pertinent patient test results.  I agree with the assessment, diagnosis, and plan of care documented in the resident's note.    

## 2017-06-22 ENCOUNTER — Other Ambulatory Visit: Payer: Self-pay | Admitting: Infectious Diseases

## 2017-06-22 DIAGNOSIS — B20 Human immunodeficiency virus [HIV] disease: Secondary | ICD-10-CM

## 2017-09-26 ENCOUNTER — Telehealth: Payer: Self-pay

## 2017-09-26 NOTE — Telephone Encounter (Signed)
Received flu vaccine information from [facility].  Sent for scanning.   [place]

## 2017-12-13 ENCOUNTER — Other Ambulatory Visit: Payer: Self-pay | Admitting: Infectious Diseases

## 2017-12-13 DIAGNOSIS — B20 Human immunodeficiency virus [HIV] disease: Secondary | ICD-10-CM

## 2018-01-19 ENCOUNTER — Other Ambulatory Visit: Payer: Self-pay | Admitting: Infectious Diseases

## 2018-01-19 DIAGNOSIS — B20 Human immunodeficiency virus [HIV] disease: Secondary | ICD-10-CM

## 2018-02-13 ENCOUNTER — Other Ambulatory Visit: Payer: Self-pay

## 2018-02-13 ENCOUNTER — Ambulatory Visit (INDEPENDENT_AMBULATORY_CARE_PROVIDER_SITE_OTHER): Payer: Medicare Other | Admitting: Internal Medicine

## 2018-02-13 ENCOUNTER — Encounter: Payer: Self-pay | Admitting: Internal Medicine

## 2018-02-13 ENCOUNTER — Other Ambulatory Visit (HOSPITAL_COMMUNITY)
Admission: RE | Admit: 2018-02-13 | Discharge: 2018-02-13 | Disposition: A | Discharge status: Home / Self Care | Payer: Medicare Other | Source: Ambulatory Visit | Attending: Infectious Diseases | Admitting: Infectious Diseases

## 2018-02-13 ENCOUNTER — Other Ambulatory Visit: Payer: Medicare Other

## 2018-02-13 VITALS — BP 169/121 | HR 92 | Temp 98.5°F | Ht 71.0 in

## 2018-02-13 DIAGNOSIS — Z79899 Other long term (current) drug therapy: Secondary | ICD-10-CM

## 2018-02-13 DIAGNOSIS — J328 Other chronic sinusitis: Secondary | ICD-10-CM | POA: Diagnosis not present

## 2018-02-13 DIAGNOSIS — I10 Essential (primary) hypertension: Secondary | ICD-10-CM

## 2018-02-13 DIAGNOSIS — Z113 Encounter for screening for infections with a predominantly sexual mode of transmission: Secondary | ICD-10-CM | POA: Insufficient documentation

## 2018-02-13 DIAGNOSIS — B20 Human immunodeficiency virus [HIV] disease: Secondary | ICD-10-CM

## 2018-02-13 MED ORDER — FLUTICASONE PROPIONATE 50 MCG/ACT NA SUSP: 1.0000 | Freq: Every day | NASAL | 0 refills | Status: DC

## 2018-02-13 MED ORDER — FLUTICASONE PROPIONATE 50 MCG/ACT NA SUSP: 1.0000 | Freq: Every day | NASAL | 1 refills | Status: DC

## 2018-02-13 NOTE — Patient Instructions (Addendum)
I have prescribed you:  Flonase   Please also use nasal irrigation like this:

## 2018-02-13 NOTE — Progress Notes (Signed)
   CC: Congestion sinus pressure  HPI:  Mr.Max Ray is a 58 y.o. male with HIV and hypertension who presents with congestion and sinus pressure.  He began to have a productive cough, congestion, and sinus pressure over 2 months ago.  The cough has now resolved but he continues to have congestion (clear mucus) and sinus pressure.  He has also had intermittent sinus headaches for the past 2 weeks.  He has tried over-the-counter decongestants and nasal spray (suspected to be Afrin) with minimal alleviation of his symptoms.  He is also taking Claritin 10 mg daily for the last several months for a history of chronic sinusitis.  He denies fevers and chills.  He endorses intermittent dyspnea on exertion over the last several months.  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  He denies orthopnea, PND, and leg swelling.   Past Medical History:  Diagnosis Date  . Anxiety   . HIV disease ([place])   . Hypertension   . Pneumonia   . Syphilis    Review of Systems:   Review of Systems  Constitutional: Negative for chills and fever.  HENT: Positive for congestion. Negative for ear pain and sore throat.        Sinus pressure  Respiratory: Positive for shortness of breath. Negative for cough.   Cardiovascular: Negative for chest pain, palpitations, orthopnea, claudication and leg swelling.  Gastrointestinal: Negative for abdominal pain, constipation, diarrhea, nausea and vomiting.  Genitourinary: Negative for dysuria, frequency, hematuria and urgency.  All other systems reviewed and are negative.   Physical Exam:  Vitals:   02/13/18 1022  BP: (!) 152/107  Pulse: 92  Temp: 98.5 F (36.9 C)  TempSrc: Oral  SpO2: 96%  Height: 5\' 11"  (1.803 m)   Physical Exam Vitals signs reviewed.  Constitutional:      Appearance: Normal appearance.  HENT:     Head: Normocephalic and atraumatic.     Nose: Congestion and rhinorrhea present.     Comments: Erythema of the nasal  turbinates in each nare. Cardiovascular:     Rate and Rhythm: Normal rate and regular rhythm.  Pulmonary:     Effort: Pulmonary effort is normal. No respiratory distress.     Breath sounds: Normal breath sounds.  Musculoskeletal:        General: No tenderness.     Right lower leg: No edema.     Left lower leg: No edema.  Skin:    General: Skin is warm and dry.  Neurological:     Mental Status: He is alert and oriented to person, place, and time. Mental status is at baseline.  Psychiatric:        Mood and Affect: Mood normal.        Behavior: Behavior normal.     Assessment & Plan:   See Encounters Tab for problem based charting.  Patient discussed with Dr. Eppie Gibson

## 2018-02-13 NOTE — Assessment & Plan Note (Signed)
Max Ray presents with congestion and sinus pressure.  He also had a productive cough with clear mucus which is now resolved.  I do believe that his symptoms were likely due to a combination of viral sinusitis and rebound mucosal inflammation/congestion from Afrin use.  I will prescribe Flonase to decrease her intranasal inflammation.  I have also recommended that he continue his Claritin.  I have advised against Afrin use as this can saturate a cycle of rhinorrhea.  Plan: 1.  Prescribed Flonase 2.  Continue Afrin use completely 3.  Continue Claritin

## 2018-02-13 NOTE — Assessment & Plan Note (Signed)
Blood pressure elevated today to 152/107.  He reports good compliance with amlodipine 5 mg daily and losartan 50 mg daily.  It is difficult to determine whether his elevated blood pressure is due to inadequate antihypertensive therapy versus decongestant use.  We will plan on renewing current regimen and repeating blood pressure at next clinic visit when his sinusitis resolves.  Plan: 1.  Continue losartan 50 mg daily 2.  Continue amlodipine 5 mg daily 3.  Repeat blood pressure at next clinic visit

## 2018-02-14 ENCOUNTER — Encounter: Payer: Self-pay | Admitting: Infectious Diseases

## 2018-02-14 LAB — T-HELPER CELLS (CD4) COUNT (NOT AT ARMC)
CD4 % Helper T Cell: 35 % (ref 33–55)
CD4 T CELL ABS: 1940 /uL (ref 400–2700)

## 2018-02-14 LAB — URINE CYTOLOGY ANCILLARY ONLY
Chlamydia: NEGATIVE
Neisseria Gonorrhea: NEGATIVE

## 2018-02-14 NOTE — Progress Notes (Signed)
Case discussed with Dr. Alfonse Spruce at the time of the visit.  We reviewed the resident's history and exam and pertinent patient test results.  I agree with the assessment, diagnosis and plan of care documented in the resident's note.

## 2018-02-15 LAB — COMPREHENSIVE METABOLIC PANEL
AG Ratio: 1.3 (calc) (ref 1.0–2.5)
ALT: 25 U/L (ref 9–46)
AST: 26 U/L (ref 10–35)
Albumin: 4.6 g/dL (ref 3.6–5.1)
Alkaline phosphatase (APISO): 72 U/L (ref 35–144)
BUN: 12 mg/dL (ref 7–25)
CALCIUM: 9.7 mg/dL (ref 8.6–10.3)
CO2: 31 mmol/L (ref 20–32)
Chloride: 102 mmol/L (ref 98–110)
Creat: 1.14 mg/dL (ref 0.70–1.33)
Globulin: 3.5 g/dL (calc) (ref 1.9–3.7)
Glucose, Bld: 101 mg/dL — ABNORMAL HIGH (ref 65–99)
Potassium: 4 mmol/L (ref 3.5–5.3)
Sodium: 140 mmol/L (ref 135–146)
Total Bilirubin: 0.4 mg/dL (ref 0.2–1.2)
Total Protein: 8.1 g/dL (ref 6.1–8.1)

## 2018-02-15 LAB — CBC WITH DIFFERENTIAL/PLATELET
Absolute Monocytes: 645 cells/uL (ref 200–950)
BASOS ABS: 52 {cells}/uL (ref 0–200)
Basophils Relative: 0.6 %
EOS PCT: 2.9 %
Eosinophils Absolute: 249 cells/uL (ref 15–500)
HCT: 44.5 % (ref 38.5–50.0)
Hemoglobin: 15.8 g/dL (ref 13.2–17.1)
Lymphs Abs: 2700 cells/uL (ref 850–3900)
MCH: 34.2 pg — ABNORMAL HIGH (ref 27.0–33.0)
MCHC: 35.5 g/dL (ref 32.0–36.0)
MCV: 96.3 fL (ref 80.0–100.0)
MPV: 10.2 fL (ref 7.5–12.5)
Monocytes Relative: 7.5 %
Neutro Abs: 4954 cells/uL (ref 1500–7800)
Neutrophils Relative %: 57.6 %
Platelets: 317 10*3/uL (ref 140–400)
RBC: 4.62 10*6/uL (ref 4.20–5.80)
RDW: 12.2 % (ref 11.0–15.0)
Total Lymphocyte: 31.4 %
WBC: 8.6 10*3/uL (ref 3.8–10.8)

## 2018-02-15 LAB — LIPID PANEL
CHOLESTEROL: 195 mg/dL (ref <200)
HDL: 52 mg/dL (ref >=40)
LDL Cholesterol (Calc): 104 mg/dL (calc) — ABNORMAL HIGH
Non-HDL Cholesterol (Calc): 143 mg/dL (calc) — ABNORMAL HIGH (ref <130)
TRIGLYCERIDES: 272 mg/dL — AB (ref <150)
Total CHOL/HDL Ratio: 3.8 (calc) (ref <5.0)

## 2018-02-15 LAB — HIV-1 RNA QUANT-NO REFLEX-BLD
HIV 1 RNA Quant: <20 copies/mL — AB
HIV-1 RNA Quant, Log: <1.3 Log copies/mL — AB

## 2018-02-15 LAB — RPR: RPR Ser Ql: REACTIVE — AB

## 2018-02-15 LAB — FLUORESCENT TREPONEMAL AB(FTA)-IGG-BLD: Fluorescent Treponemal ABS: REACTIVE — AB

## 2018-02-15 LAB — RPR TITER: RPR Titer: 1:2 {titer} — ABNORMAL HIGH

## 2018-02-28 ENCOUNTER — Encounter: Payer: Medicare Other | Admitting: Infectious Diseases

## 2018-04-16 DIAGNOSIS — R0602 Shortness of breath: Secondary | ICD-10-CM | POA: Diagnosis not present

## 2018-05-24 ENCOUNTER — Other Ambulatory Visit: Payer: Self-pay | Admitting: Infectious Diseases

## 2018-05-24 DIAGNOSIS — B20 Human immunodeficiency virus [HIV] disease: Secondary | ICD-10-CM

## 2018-05-29 ENCOUNTER — Other Ambulatory Visit: Payer: Self-pay | Admitting: Infectious Diseases

## 2018-05-29 DIAGNOSIS — B20 Human immunodeficiency virus [HIV] disease: Secondary | ICD-10-CM

## 2018-05-30 ENCOUNTER — Telehealth: Payer: Self-pay | Admitting: Infectious Diseases

## 2018-05-30 NOTE — Telephone Encounter (Signed)
UORVI-15 Pre-Screening Questions: 05/30/18   Do you currently have a fever (>100 F), chills or unexplained body aches? NO   Are you currently experiencing new cough, shortness of breath, sore throat, runny nose?NO   Have you recently travelled outside the state of [place] in the last 14 days? NO   Have you been in contact with someone that is currently pending confirmation of Covid19 testing or has been confirmed to have the [place] virus?  NO  **If the patient answers NO to ALL questions -  advise the patient to please call the clinic before coming to the office should any symptoms develop.

## 2018-05-30 NOTE — Progress Notes (Signed)
Name: Max Ray  DOB: 01-12-1960 MRN: [id number] PCP: Max Falcon, MD    Brief Narrative:  Max Ray is a 58 y.o. male with HIV.  CD4 nadir unknown  HIV Risk: MSM/Bisexual History of OIs: None known Intake Labs: 2013 Hep B sAg (-), sAb (+); Hep A (vax 2014), Hep C (-) Quantiferon (-) HLA B*5701 ([) G6PD: ()   Previous Regimens: . Complera . Odefsey >> undetectable   Genotypes: . None on file   Subjective:  CC: Routine HIV follow-up care.  Experience shortness of breath at night off and on for about a month.  HPI: Max Ray is here today for follow-up.  His last office visit with Dr. Johnnye Ray was over a year ago.  He had lab work done back in February indicating well-controlled HIV with undetectable viral load and a healthy CD4 count of 1940.  He denies any missed doses of his Vernell Leep and is currently taking it as prescribed once daily with food.  He has no trouble or concerns with access or side effects to this medication.  He has had several male sexual partners since last office visit.  Requesting rectal and oral swabs for further STD testing considering his exposure sites.  He is also requesting consideration to repeat hepatitis C lab work if it is not been recent.  He has been doing some reading about it and would like to discuss his risk factors.  He denies any active or previous injection or intranasal drug use, no known sexual contacts that were positive, MSM with versatile intercourse.  He has had no interval hospitalizations since his last office visit per our records.  He is in care with the [facility] health internal medicine clinic for management of his high blood pressure.  He has had trouble ongoingly with allergies and sinusitis.  He has been taking Flonase as prescribed and feels that this is helped him quite a bit.  He takes a generic over-the-counter antihistamine as well as eyedrops.  He tells me that for about a month off and on he would experience  nighttime shortness of breath and feeling like he cannot breathe coming in from his throat.  He would sit up and the symptoms would resolve.  He is wondering if this is related to reflux but a friend scared him thinking this might of been a PE.  He went to an urgent care and [place] around the time that this took place and they advised him to go to the ER which he did not do because he was afraid to catch something in the waiting room.  He is wondering if this is actually related to reflux and would like to discuss typical symptoms and recommendations.  To clarify his symptoms have resolved entirely at this point.  He has not taken any acid reducing medications.  He is currently working as a Presenter, broadcasting overnight.  Eats before he goes to bed when he gets home in the morning around 830.  He is not a smoker.  He consumes a lot of caffeine through coffee and various other drinks.  He takes multiple vitamins and he tries to be health-conscious to boost his immune system.  Currently wearing a mask gloves and frequent hand sanitation at work.  Lives by himself.  He has no concerns today about anxiety or depression.  Review of Systems  All other systems reviewed and are negative. 12 point review of systems reviewed  Past Medical History:  Diagnosis Date  .  Anxiety   . HIV disease ([place])   . Hypertension   . Pneumonia   . Syphilis     Outpatient Medications Prior to Visit  Medication Sig Dispense Refill  . Ascorbic Acid (VITAMIN C) 1000 MG tablet Take 1,000 mg by mouth daily.    . fluconazole (DIFLUCAN) 100 MG tablet Take 3 tablets (300 mg total) by mouth once a week. For 4 weeks 12 tablet 0  . Loratadine (CLARITIN PO) Take 10 mg by mouth daily.    Marland Kitchen losartan (COZAAR) 50 MG tablet Take 1 tablet (50 mg total) by mouth daily. 90 tablet 3  . Multiple Vitamin (MULTIVITAMIN) tablet Take 1 tablet by mouth daily.    . ODEFSEY 200-25-25 MG TABS tablet TAKE 1 TABLET BY MOUTH EVERY DAY 30 tablet 0  .  oxymetazoline (AFRIN) 0.05 % nasal spray Place 1 spray into both nostrils 2 (two) times daily.    . sildenafil (VIAGRA) 100 MG tablet Take 1 tablet (100 mg total) daily as needed by mouth for erectile dysfunction. Do not take more frequently than every 48 hours.  Call MD/go to ER if erection lasts more than 6 hours. 10 tablet 1  . amLODipine (NORVASC) 5 MG tablet Take 1 tablet (5 mg total) by mouth daily. 90 tablet 1  . fluticasone (FLONASE) 50 MCG/ACT nasal spray Place 1 spray into both nostrils daily for 30 days. 16 g 1   No facility-administered medications prior to visit.      Allergies  Allergen Reactions  . Sulfamethoxazole-Trimethoprim Other (See Comments)    Unknown allergic reaction  . Sulfonamide Derivatives     Social History   Tobacco Use  . Smoking status: Never Smoker  . Smokeless tobacco: Never Used  Substance Use Topics  . Alcohol use: Yes    Alcohol/week: 1.0 standard drinks    Types: 1 Standard drinks or equivalent per week    Comment: social  . Drug use: No    Family History  Problem Relation Age of Onset  . Breast cancer Paternal Aunt        x 3  . Pancreatic cancer Maternal Grandfather   . Other Father        prostate disease    Social History   Substance and Sexual Activity  Sexual Activity Yes  . Partners: Male  . Birth control/protection: Condom   Comment: pt. given condoms     Objective:   Vitals:   05/31/18 0938  BP: (!) 155/109  Pulse: 94  Temp: 98.3 F (36.8 C)  Weight: 222 lb (100.7 kg)   Body mass index is 30.96 kg/m.  Physical Exam Constitutional:      Appearance: He is well-developed.     Comments: Seated comfortably in chair during visit.   HENT:     Mouth/Throat:     Dentition: Normal dentition. No dental abscesses.  Cardiovascular:     Rate and Rhythm: Normal rate and regular rhythm.     Heart sounds: Normal heart sounds.  Pulmonary:     Effort: Pulmonary effort is normal.     Breath sounds: Normal breath  sounds.  Abdominal:     General: There is no distension.     Palpations: Abdomen is soft.     Tenderness: There is no abdominal tenderness.  Lymphadenopathy:     Cervical: No cervical adenopathy.  Skin:    General: Skin is warm and dry.     Findings: No rash.  Neurological:     Mental  Status: He is alert and oriented to person, place, and time.  Psychiatric:        Judgment: Judgment normal.     Comments: In good spirits today and engaged in care discussion.      Lab Results Lab Results  Component Value Date   WBC 8.6 02/13/2018   HGB 15.8 02/13/2018   HCT 44.5 02/13/2018   MCV 96.3 02/13/2018   PLT 317 02/13/2018    Lab Results  Component Value Date   CREATININE 1.14 02/13/2018   BUN 12 02/13/2018   NA 140 02/13/2018   K 4.0 02/13/2018   CL 102 02/13/2018   CO2 31 02/13/2018    Lab Results  Component Value Date   ALT 25 02/13/2018   AST 26 02/13/2018   ALKPHOS 69 04/27/2016   BILITOT 0.4 02/13/2018    Lab Results  Component Value Date   CHOL 195 02/13/2018   HDL 52 02/13/2018   LDLCALC 104 (H) 02/13/2018   TRIG 272 (H) 02/13/2018   CHOLHDL 3.8 02/13/2018   HIV 1 RNA Quant (copies/mL)  Date Value  02/13/2018 <20 DETECTED (A)  05/18/2017 <20 NOT DETECTED  11/08/2016 <20 DETECTED (A)   CD4 T Cell Abs (/uL)  Date Value  02/13/2018 1,940  05/18/2017 1,120  11/08/2016 1,320     Assessment & Plan:   Problem List Items Addressed This Visit      Unprioritized   History of syphilis    Most recent RPR titer 1-2 indicating serofast state.  No signs or concerns for reinfection through exam or blood work.  Continue to monitor every 6 to 12 months.      Human immunodeficiency virus (HIV) disease ([place]) - Primary    Max Ray continues to do well on his [place].  He is undetectable with a very healthy CD4 count at lab work 2 months ago.  Will not repeat lab work today with his reported adherence and good track record.  I will continue his [place] for now as  long as he does not require treatment for his possible reflux. STI testing performed today including oral and rectal gonorrhea and Chlamydia testing, hepatitis C antibody testing with risk associated with MSM population.  Recent RPR was negative. Discussed and counseled on safe sex interactions. We will update his vaccines and administer Prevnar today. He can follow-up in 6 months with labs prior to his visit with Dr. Johnnye Ray.      Relevant Orders   Hepatitis C antibody   T-helper cell (CD4)- ([facility] clinic only)   HIV-1 RNA quant-no reflex-bld   Pneumococcal conjugate vaccine 13-valent IM (Completed)   Shortness of breath    Not clear as to what was the cause of this problem for Max Ray.  He describes his episodes not with activity or rest but only when he lays flat at sleep.  He describes more feeling of his throat/chest making it difficult to breathe.  This was resolved with's upright positioning.  He denies any lower extremity swelling, activity intolerance, difficulty with stairs/ADLs.  He does not need to take frequent breaks to catch his breath during the day.  It is possible that he was experiencing reflux at the time.  I discussed and provided him information today for lifestyle changes recommended to reduce this likelihood.  I also counseled that if he were to require acid reducing medications this is a contraindication with his current Odefsey.  We would need to switch to an alternative, likely Biktarvy if that is the  case.  He will keep an eye on this and call to let us know.       Other Visit Diagnoses    Routine screening for STI (sexually transmitted infection)       Relevant Orders   Cytology (oral, anal, urethral) ancillary only   Cytology (oral, anal, urethral) ancillary only   RPR   Urine cytology ancillary only   Need for pneumococcal vaccine       Relevant Orders   Pneumococcal conjugate vaccine 13-valent IM (Completed)      Janene Madeira, MSN, NP-C [facility]  for Infectious [place] Pager: [phone] Office: [phone]  05/31/18  1:07 PM

## 2018-05-31 ENCOUNTER — Other Ambulatory Visit (HOSPITAL_COMMUNITY)
Admission: RE | Admit: 2018-05-31 | Discharge: 2018-05-31 | Disposition: A | Discharge status: Home / Self Care | Payer: Medicare Other | Source: Ambulatory Visit | Attending: Infectious Diseases | Admitting: Infectious Diseases

## 2018-05-31 ENCOUNTER — Encounter: Payer: Self-pay | Admitting: Infectious Diseases

## 2018-05-31 ENCOUNTER — Ambulatory Visit (INDEPENDENT_AMBULATORY_CARE_PROVIDER_SITE_OTHER): Payer: Medicare Other | Admitting: Infectious Diseases

## 2018-05-31 ENCOUNTER — Other Ambulatory Visit: Payer: Self-pay

## 2018-05-31 VITALS — BP 155/109 | HR 94 | Temp 98.3°F | Wt 222.0 lb

## 2018-05-31 DIAGNOSIS — B20 Human immunodeficiency virus [HIV] disease: Secondary | ICD-10-CM

## 2018-05-31 DIAGNOSIS — Z113 Encounter for screening for infections with a predominantly sexual mode of transmission: Secondary | ICD-10-CM | POA: Insufficient documentation

## 2018-05-31 DIAGNOSIS — Z23 Encounter for immunization: Secondary | ICD-10-CM | POA: Diagnosis not present

## 2018-05-31 DIAGNOSIS — Z8619 Personal history of other infectious and parasitic diseases: Secondary | ICD-10-CM | POA: Diagnosis not present

## 2018-05-31 DIAGNOSIS — R0602 Shortness of breath: Secondary | ICD-10-CM | POA: Insufficient documentation

## 2018-05-31 NOTE — Patient Instructions (Addendum)
Please continue your Vernell Leep once a day with food. Your blood work looks great!   We updated your pneumonia vaccine today.  We will check your hepatitis C lab work and other swabs as we discussed.  Please come back in 6 months to see Dr. Johnnye Sima or Colletta Maryland again with labs 2 weeks before.  Please do not start any acid reducing medications without letting us know.  We will have to change her Odefsey to a different medication if you need treatment for heartburn.   Gastroesophageal Reflux Disease, Adult Gastroesophageal reflux (GER) happens when acid from the stomach flows up into the tube that connects the mouth and the stomach (esophagus). Normally, food travels down the esophagus and stays in the stomach to be digested. With GER, food and stomach acid sometimes move back up into the esophagus. You may have a disease called gastroesophageal reflux disease (GERD) if the reflux:  Happens often.  Causes frequent or very bad symptoms.  Causes problems such as damage to the esophagus. When this happens, the esophagus becomes sore and swollen (inflamed). Over time, GERD can make small holes (ulcers) in the lining of the esophagus. What are the causes? This condition is caused by a problem with the muscle between the esophagus and the stomach. When this muscle is weak or not normal, it does not close properly to keep food and acid from coming back up from the stomach. The muscle can be weak because of:  Tobacco use.  Pregnancy.  Having a certain type of hernia (hiatal hernia).  Alcohol use.  Certain foods and drinks, such as coffee, chocolate, onions, and peppermint. What increases the risk? You are more likely to develop this condition if you:  Are overweight.  Have a disease that affects your connective tissue.  Use NSAID medicines. What are the signs or symptoms? Symptoms of this condition include:  Heartburn.  Difficult or painful swallowing.  The feeling of having a lump  in the throat.  A bitter taste in the mouth.  Bad breath.  Having a lot of saliva.  Having an upset or bloated stomach.  Belching.  Chest pain. Different conditions can cause chest pain. Make sure you see your doctor if you have chest pain.  Shortness of breath or noisy breathing (wheezing).  Ongoing (chronic) cough or a cough at night.  Wearing away of the surface of teeth (tooth enamel).  Weight loss. How is this treated? Treatment will depend on how bad your symptoms are. Your doctor may suggest:  Changes to your diet.  Medicine.  Surgery. Follow these instructions at home: Eating and drinking   Follow a diet as told by your doctor. You may need to avoid foods and drinks such as: ? Coffee and tea (with or without caffeine). ? Drinks that contain alcohol. ? Energy drinks and sports drinks. ? Bubbly (carbonated) drinks or sodas. ? Chocolate and cocoa. ? Peppermint and mint flavorings. ? Garlic and onions. ? Horseradish. ? Spicy and acidic foods. These include peppers, chili powder, curry powder, vinegar, hot sauces, and BBQ sauce. ? Citrus fruit juices and citrus fruits, such as oranges, lemons, and limes. ? Tomato-based foods. These include red sauce, chili, salsa, and pizza with red sauce. ? Fried and fatty foods. These include donuts, french fries, potato chips, and high-fat dressings. ? High-fat meats. These include hot dogs, rib eye steak, sausage, ham, and bacon. ? High-fat dairy items, such as whole milk, butter, and cream cheese.  Eat small meals often. Avoid eating  large meals.  Avoid drinking large amounts of liquid with your meals.  Avoid eating meals during the 2-3 hours before bedtime.  Avoid lying down right after you eat.  Do not exercise right after you eat. Lifestyle   Do not use any products that contain nicotine or tobacco. These include cigarettes, e-cigarettes, and chewing tobacco. If you need help quitting, ask your doctor.  Try to  lower your stress. If you need help doing this, ask your doctor.  If you are overweight, lose an amount of weight that is healthy for you. Ask your doctor about a safe weight loss goal. General instructions  Pay attention to any changes in your symptoms.  Take over-the-counter and prescription medicines only as told by your doctor. Do not take aspirin, ibuprofen, or other NSAIDs unless your doctor says it is okay.  Wear loose clothes. Do not wear anything tight around your waist.  Raise (elevate) the head of your bed about 6 inches (15 cm).  Avoid bending over if this makes your symptoms worse.  Keep all follow-up visits as told by your doctor. This is important. Contact a doctor if:  You have new symptoms.  You lose weight and you do not know why.  You have trouble swallowing or it hurts to swallow.  You have wheezing or a cough that keeps happening.  Your symptoms do not get better with treatment.  You have a hoarse voice. Get help right away if:  You have pain in your arms, neck, jaw, teeth, or back.  You feel sweaty, dizzy, or light-headed.  You have chest pain or shortness of breath.  You throw up (vomit) and your throw-up looks like blood or coffee grounds.  You pass out (faint).  Your poop (stool) is bloody or black.  You cannot swallow, drink, or eat. Summary  If a person has gastroesophageal reflux disease (GERD), food and stomach acid move back up into the esophagus and cause symptoms or problems such as damage to the esophagus.  Treatment will depend on how bad your symptoms are.  Follow a diet as told by your doctor.  Take all medicines only as told by your doctor. This information is not intended to replace advice given to you by your health care provider. Make sure you discuss any questions you have with your health care provider. Document Released: 06/08/2007 Document Revised: 06/28/2017 Document Reviewed: 06/28/2017 Elsevier Interactive Patient  Education  2019 [employer].

## 2018-05-31 NOTE — Assessment & Plan Note (Signed)
Most recent RPR titer 1-2 indicating serofast state.  No signs or concerns for reinfection through exam or blood work.  Continue to monitor every 6 to 12 months.

## 2018-05-31 NOTE — Assessment & Plan Note (Addendum)
Not clear as to what was the cause of this problem for Max Ray.  He describes his episodes not with activity or rest but only when he lays flat at sleep.  He describes more feeling of his throat/chest making it difficult to breathe.  This was resolved with's upright positioning.  He denies any lower extremity swelling, activity intolerance, difficulty with stairs/ADLs.  He does not need to take frequent breaks to catch his breath during the day.  It is possible that he was experiencing reflux at the time.  I discussed and provided him information today for lifestyle changes recommended to reduce this likelihood.  I also counseled that if he were to require acid reducing medications this is a contraindication with his current Odefsey.  We would need to switch to an alternative, likely Biktarvy if that is the case.  He will keep an eye on this and call to let us know.

## 2018-05-31 NOTE — Assessment & Plan Note (Signed)
Max Ray continues to do well on his Odefsey.  He is undetectable with a very healthy CD4 count at lab work 2 months ago.  Will not repeat lab work today with his reported adherence and good track record.  I will continue his [place] for now as long as he does not require treatment for his possible reflux. STI testing performed today including oral and rectal gonorrhea and Chlamydia testing, hepatitis C antibody testing with risk associated with MSM population.  Recent RPR was negative. Discussed and counseled on safe sex interactions. We will update his vaccines and administer Prevnar today. He can follow-up in 6 months with labs prior to his visit with Dr. Johnnye Sima.

## 2018-06-01 LAB — CYTOLOGY, (ORAL, ANAL, URETHRAL) ANCILLARY ONLY
Chlamydia: NEGATIVE
Chlamydia: NEGATIVE
Neisseria Gonorrhea: NEGATIVE
Neisseria Gonorrhea: NEGATIVE

## 2018-06-01 LAB — HEPATITIS C ANTIBODY
Hepatitis C Ab: NONREACTIVE
SIGNAL TO CUT-OFF: 0.05 (ref <1.00)

## 2018-06-02 ENCOUNTER — Other Ambulatory Visit: Payer: Self-pay | Admitting: Internal Medicine

## 2018-06-02 DIAGNOSIS — J328 Other chronic sinusitis: Secondary | ICD-10-CM

## 2018-06-04 ENCOUNTER — Other Ambulatory Visit: Payer: Self-pay | Admitting: Internal Medicine

## 2018-06-04 ENCOUNTER — Telehealth: Payer: Self-pay | Admitting: *Deleted

## 2018-06-04 DIAGNOSIS — J328 Other chronic sinusitis: Secondary | ICD-10-CM

## 2018-06-04 NOTE — Telephone Encounter (Signed)
-----   Message from  Callas, NP sent at 06/01/2018  4:25 PM EDT ----- Please call Max Ray to inform him his hepatitis c blood test was negative as was all of his other swabs.

## 2018-06-04 NOTE — Telephone Encounter (Signed)
RN advised patient I was calling from his doctor's office with results from last week, asked for confirmation of name/date of birth.  Patient would not confirm his name/date of birth. He stated he didn't go to a doctor's appointment last week, that he did not know who he was speaking to. RN advised patient to either activate his mychart account for these test results or to call back to the doctor's office. Landis Gandy, RN

## 2018-06-05 ENCOUNTER — Other Ambulatory Visit: Payer: Self-pay | Admitting: Infectious Diseases

## 2018-06-05 DIAGNOSIS — I10 Essential (primary) hypertension: Secondary | ICD-10-CM

## 2018-06-25 ENCOUNTER — Other Ambulatory Visit: Payer: Self-pay | Admitting: Infectious Diseases

## 2018-06-25 DIAGNOSIS — B20 Human immunodeficiency virus [HIV] disease: Secondary | ICD-10-CM

## 2018-10-10 ENCOUNTER — Other Ambulatory Visit: Payer: Self-pay | Admitting: Infectious Diseases

## 2018-10-10 DIAGNOSIS — B20 Human immunodeficiency virus [HIV] disease: Secondary | ICD-10-CM

## 2018-10-23 ENCOUNTER — Ambulatory Visit: Payer: Medicare Other

## 2018-10-23 ENCOUNTER — Other Ambulatory Visit: Payer: Self-pay

## 2018-10-24 ENCOUNTER — Encounter: Payer: Self-pay | Admitting: Infectious Diseases

## 2018-10-29 ENCOUNTER — Telehealth: Payer: Self-pay | Admitting: Pharmacy Technician

## 2018-10-29 NOTE — Telephone Encounter (Signed)
[facility] Patient Advocate Encounter  [facility] [place] [employer] relief Program requested further information on the treating physician. This form was filled out to completion and faxed back to the foundation at [phone]. They requested primary diagnosis, date of diagnosis, treatment date and medication being prescribed.

## 2018-10-30 NOTE — Telephone Encounter (Signed)
[facility] Patient Advocate Encounter   I was successful in securing patient a $7341.76 grant from [facility] [place] (PAF) to provide copayment coverage for [facility] This will make the out of pocket cost $0.     I have spoken with the patient and let him know of the approval.    The billing information is as follows:  RxBin: [place]  PCN: [id number] Member ID: [id number] Group ID: [id number] Dates of Eligibility: 10/22/2018 through 10/22/2019  Patient knows to call the office with questions or concerns.  Bartholomew Crews, CPhT [facility] for [facility]ious [facility] Phone: [phone] Fax: [phone] 10/30/2018 11:44 AM

## 2018-12-12 ENCOUNTER — Other Ambulatory Visit: Payer: Self-pay

## 2018-12-12 DIAGNOSIS — B20 Human immunodeficiency virus [HIV] disease: Secondary | ICD-10-CM

## 2018-12-12 MED ORDER — ODEFSEY 200-25-25 MG PO TABS: 1.0000 | ORAL_TABLET | Freq: Every day | ORAL | 0 refills | Status: DC

## 2018-12-12 NOTE — Progress Notes (Signed)
Attempted to call patient to schedule overdue follow up appointment. Will send 30 day supply of medication to pharmacy. Patient will need to schedule appointment for future refills. [place]

## 2019-01-10 ENCOUNTER — Other Ambulatory Visit: Payer: Self-pay | Admitting: Infectious Diseases

## 2019-01-10 DIAGNOSIS — B20 Human immunodeficiency virus [HIV] disease: Secondary | ICD-10-CM

## 2019-02-11 ENCOUNTER — Other Ambulatory Visit: Payer: Self-pay

## 2019-02-11 ENCOUNTER — Ambulatory Visit: Payer: Medicare Other

## 2019-02-11 ENCOUNTER — Other Ambulatory Visit: Payer: Medicare Other

## 2019-02-11 DIAGNOSIS — B20 Human immunodeficiency virus [HIV] disease: Secondary | ICD-10-CM

## 2019-02-11 DIAGNOSIS — Z113 Encounter for screening for infections with a predominantly sexual mode of transmission: Secondary | ICD-10-CM

## 2019-02-12 ENCOUNTER — Other Ambulatory Visit (HOSPITAL_COMMUNITY)
Admission: RE | Admit: 2019-02-12 | Discharge: 2019-02-12 | Disposition: A | Discharge status: Home / Self Care | Payer: Medicare Other | Source: Ambulatory Visit | Attending: Infectious Diseases | Admitting: Infectious Diseases

## 2019-02-12 DIAGNOSIS — Z113 Encounter for screening for infections with a predominantly sexual mode of transmission: Secondary | ICD-10-CM | POA: Diagnosis present

## 2019-02-12 LAB — T-HELPER CELL (CD4) - (RCID CLINIC ONLY)
CD4 % Helper T Cell: 36 % (ref 33–65)
CD4 T Cell Abs: 1286 /uL (ref 400–1790)

## 2019-02-13 ENCOUNTER — Encounter: Payer: Self-pay | Admitting: Infectious Diseases

## 2019-02-13 ENCOUNTER — Encounter: Payer: Self-pay | Admitting: *Deleted

## 2019-02-13 LAB — URINE CYTOLOGY ANCILLARY ONLY
Chlamydia: NEGATIVE
Comment: NEGATIVE
Comment: NORMAL
Neisseria Gonorrhea: NEGATIVE

## 2019-02-14 LAB — CBC WITH DIFFERENTIAL/PLATELET
Absolute Monocytes: 1050 cells/uL — ABNORMAL HIGH (ref 200–950)
Basophils Absolute: 42 cells/uL (ref 0–200)
Basophils Relative: 0.3 %
Eosinophils Absolute: 126 cells/uL (ref 15–500)
Eosinophils Relative: 0.9 %
HCT: 45.5 % (ref 38.5–50.0)
Hemoglobin: 15.9 g/dL (ref 13.2–17.1)
Lymphs Abs: 3850 cells/uL (ref 850–3900)
MCH: 34.9 pg — ABNORMAL HIGH (ref 27.0–33.0)
MCHC: 34.9 g/dL (ref 32.0–36.0)
MCV: 100 fL (ref 80.0–100.0)
MPV: 9.6 fL (ref 7.5–12.5)
Monocytes Relative: 7.5 %
Neutro Abs: 8932 cells/uL — ABNORMAL HIGH (ref 1500–7800)
Neutrophils Relative %: 63.8 %
Platelets: 302 10*3/uL (ref 140–400)
RBC: 4.55 10*6/uL (ref 4.20–5.80)
RDW: 11.5 % (ref 11.0–15.0)
Total Lymphocyte: 27.5 %
WBC: 14 10*3/uL — ABNORMAL HIGH (ref 3.8–10.8)

## 2019-02-14 LAB — COMPREHENSIVE METABOLIC PANEL
AG Ratio: 1.4 (calc) (ref 1.0–2.5)
ALT: 26 U/L (ref 9–46)
AST: 30 U/L (ref 10–35)
Albumin: 4.6 g/dL (ref 3.6–5.1)
Alkaline phosphatase (APISO): 61 U/L (ref 35–144)
BUN/Creatinine Ratio: 8 (calc) (ref 6–22)
BUN: 13 mg/dL (ref 7–25)
CO2: 25 mmol/L (ref 20–32)
Calcium: 9.8 mg/dL (ref 8.6–10.3)
Chloride: 101 mmol/L (ref 98–110)
Creat: 1.64 mg/dL — ABNORMAL HIGH (ref 0.70–1.33)
Globulin: 3.2 g/dL (calc) (ref 1.9–3.7)
Glucose, Bld: 82 mg/dL (ref 65–99)
Potassium: 4 mmol/L (ref 3.5–5.3)
Sodium: 136 mmol/L (ref 135–146)
Total Bilirubin: 1 mg/dL (ref 0.2–1.2)
Total Protein: 7.8 g/dL (ref 6.1–8.1)

## 2019-02-14 LAB — LIPID PANEL
Cholesterol: 199 mg/dL (ref <200)
HDL: 64 mg/dL (ref >=40)
LDL Cholesterol (Calc): 94 mg/dL (calc)
Non-HDL Cholesterol (Calc): 135 mg/dL (calc) — ABNORMAL HIGH (ref <130)
Total CHOL/HDL Ratio: 3.1 (calc) (ref <5.0)
Triglycerides: 286 mg/dL — ABNORMAL HIGH (ref <150)

## 2019-02-14 LAB — RPR TITER: RPR Titer: 1:2 {titer} — ABNORMAL HIGH

## 2019-02-14 LAB — HIV-1 RNA QUANT-NO REFLEX-BLD
HIV 1 RNA Quant: <20 copies/mL
HIV-1 RNA Quant, Log: <1.3 Log copies/mL

## 2019-02-14 LAB — RPR: RPR Ser Ql: REACTIVE — AB

## 2019-02-14 LAB — FLUORESCENT TREPONEMAL AB(FTA)-IGG-BLD: Fluorescent Treponemal ABS: REACTIVE — AB

## 2019-02-21 ENCOUNTER — Telehealth (INDEPENDENT_AMBULATORY_CARE_PROVIDER_SITE_OTHER): Payer: Medicare Other | Admitting: Infectious Diseases

## 2019-02-21 DIAGNOSIS — Z21 Asymptomatic human immunodeficiency virus [HIV] infection status: Secondary | ICD-10-CM

## 2019-02-21 NOTE — Progress Notes (Deleted)
Name: Max Ray  DOB: 08/08/1960 MRN: [id number] PCP: Sid Falcon, MD    Virtual Visit via Telephone Note  I connected with Max Ray on 02/21/19 at 10:30 AM EST by telephone and verified that I am speaking with the correct person using two identifiers.   I discussed the limitations, risks, security and privacy concerns of performing an evaluation and management service by telephone and the availability of in person appointments. I also discussed with the patient that there may be a patient responsible charge related to this service. The patient expressed understanding and agreed to proceed.    Brief Narrative:  Max Ray is a 59 y.o. male with HIV.  CD4 nadir unknown  HIV Risk: MSM/Bisexual History of OIs: None known Intake Labs: 2013 Hep B sAg (-), sAb (+); Hep A (vax 2014), Hep C (-) Quantiferon (-) HLA B*5701 ([) G6PD: ()   Previous Regimens: . Complera . Odefsey >> undetectable   Genotypes: . None on file   Subjective:  CC: Routine HIV follow-up care.    HPI: Max Ray is here today for follow-up.  His last office visit with Dr. Johnnye Sima was over a year ago.  He had lab work done back in February indicating well-controlled HIV with undetectable viral load and a healthy CD4 count of 1940.  He denies any missed doses of his Max Ray and is currently taking it as prescribed once daily with food.  He has no trouble or concerns with access or side effects to this medication.  He has had several male sexual partners since last office visit.  Requesting rectal and oral swabs for further STD testing considering his exposure sites.  He is also requesting consideration to repeat hepatitis C lab work if it is not been recent.  He has been doing some reading about it and would like to discuss his risk factors.    He has had no interval hospitalizations since his last office visit per our records.  He is in care with the [facility] for BP but  I don't see any visits since last rx's were given.   He is currently working as a Presenter, broadcasting overnight.  Eats before he goes to bed when he gets home in the morning around 830.  He is not a smoker.  He consumes a lot of caffeine through coffee and various other drinks.  He takes multiple vitamins and he tries to be health-conscious to boost his immune system.  Currently wearing a mask gloves and frequent hand sanitation at work.  Lives by himself.  He has no concerns today about anxiety or depression.  Review of Systems  All other systems reviewed and are negative. 12 point review of systems reviewed  Past Medical History:  Diagnosis Date  . Anxiety   . HIV disease ([place])   . Hypertension   . Pneumonia   . Syphilis     Outpatient Medications Prior to Visit  Medication Sig Dispense Refill  . amLODipine (NORVASC) 5 MG tablet Take 1 tablet (5 mg total) by mouth daily. 90 tablet 1  . Ascorbic Acid (VITAMIN C) 1000 MG tablet Take 1,000 mg by mouth daily.    . fluconazole (DIFLUCAN) 100 MG tablet Take 3 tablets (300 mg total) by mouth once a week. For 4 weeks 12 tablet 0  . fluticasone (FLONASE) 50 MCG/ACT nasal spray SHAKE LIQUID AND USE 1 SPRAY IN EACH NOSTRIL DAILY 16 g 11  . Loratadine (CLARITIN PO)  Take 10 mg by mouth daily.    Marland Kitchen losartan (COZAAR) 50 MG tablet TAKE 1 TABLET BY MOUTH EVERY DAY 90 tablet 3  . Multiple Vitamin (MULTIVITAMIN) tablet Take 1 tablet by mouth daily.    . ODEFSEY 200-25-25 MG TABS tablet TAKE 1 TABLET BY MOUTH DAILY 30 tablet 1  . oxymetazoline (AFRIN) 0.05 % nasal spray Place 1 spray into both nostrils 2 (two) times daily.    . sildenafil (VIAGRA) 100 MG tablet Take 1 tablet (100 mg total) daily as needed by mouth for erectile dysfunction. Do not take more frequently than every 48 hours.  Call MD/go to ER if erection lasts more than 6 hours. 10 tablet 1   No facility-administered medications prior to visit.     Allergies  Allergen Reactions  .  Sulfamethoxazole-Trimethoprim Other (See Comments)    Unknown allergic reaction  . Sulfonamide Derivatives     Social History   Tobacco Use  . Smoking status: Never Smoker  . Smokeless tobacco: Never Used  Substance Use Topics  . Alcohol use: Yes    Alcohol/week: 1.0 standard drinks    Types: 1 Standard drinks or equivalent per week    Comment: social  . Drug use: No    Family History  Problem Relation Age of Onset  . Breast cancer Paternal Aunt        x 3  . Pancreatic cancer Maternal Grandfather   . Other Father        prostate disease    Social History   Substance and Sexual Activity  Sexual Activity Yes  . Partners: Male  . Birth control/protection: Condom   Comment: pt. given condoms     Objective:   There were no vitals filed for this visit. There is no height or weight on file to calculate BMI.  Physical Exam Constitutional:      Appearance: He is well-developed.     Comments: Seated comfortably in chair during visit.   HENT:     Mouth/Throat:     Dentition: Normal dentition. No dental abscesses.  Cardiovascular:     Rate and Rhythm: Normal rate and regular rhythm.     Heart sounds: Normal heart sounds.  Pulmonary:     Effort: Pulmonary effort is normal.     Breath sounds: Normal breath sounds.  Abdominal:     General: There is no distension.     Palpations: Abdomen is soft.     Tenderness: There is no abdominal tenderness.  Lymphadenopathy:     Cervical: No cervical adenopathy.  Skin:    General: Skin is warm and dry.     Findings: No rash.  Neurological:     Mental Status: He is alert and oriented to person, place, and time.  Psychiatric:        Judgment: Judgment normal.     Comments: In good spirits today and engaged in care discussion.      Lab Results Lab Results  Component Value Date   WBC 14.0 (H) 02/11/2019   HGB 15.9 02/11/2019   HCT 45.5 02/11/2019   MCV 100.0 02/11/2019   PLT 302 02/11/2019    Lab Results  Component  Value Date   CREATININE 1.64 (H) 02/11/2019   BUN 13 02/11/2019   NA 136 02/11/2019   K 4.0 02/11/2019   CL 101 02/11/2019   CO2 25 02/11/2019    Lab Results  Component Value Date   ALT 26 02/11/2019   AST 30 02/11/2019  ALKPHOS 69 04/27/2016   BILITOT 1.0 02/11/2019    Lab Results  Component Value Date   CHOL 199 02/11/2019   HDL 64 02/11/2019   LDLCALC 94 02/11/2019   TRIG 286 (H) 02/11/2019   CHOLHDL 3.1 02/11/2019   HIV 1 RNA Quant (copies/mL)  Date Value  02/11/2019 <20 NOT DETECTED  02/13/2018 <20 DETECTED (A)  05/18/2017 <20 NOT DETECTED   CD4 T Cell Abs (/uL)  Date Value  02/11/2019 1,286  02/13/2018 1,940  05/18/2017 1,120     Assessment & Plan:   Problem List Items Addressed This Visit    None      Janene Madeira, MSN, NP-C [place] [facility] Pager: [phone] Office: [phone]  02/21/19  8:36 AM

## 2019-02-21 NOTE — Progress Notes (Signed)
10:28 AM attempted to contact patient for Virtual appointment. Left non-specific voicemail to patient.   10:32 AM second attempt for Evisit - LVM that he will need to reschedule.   No charge.

## 2019-02-25 ENCOUNTER — Telehealth: Payer: Self-pay

## 2019-02-25 NOTE — Telephone Encounter (Signed)
Left patient a voice mail to call back to reschedule his 2/18 appointment (stephanie) with either hatcher or greg , He needs a visit - virtual is ok - to review labs

## 2019-03-14 ENCOUNTER — Ambulatory Visit (INDEPENDENT_AMBULATORY_CARE_PROVIDER_SITE_OTHER): Payer: Medicare Other | Admitting: Infectious Diseases

## 2019-03-14 ENCOUNTER — Other Ambulatory Visit: Payer: Self-pay

## 2019-03-14 ENCOUNTER — Encounter: Payer: Self-pay | Admitting: Infectious Diseases

## 2019-03-14 VITALS — BP 145/99 | HR 94 | Ht 70.0 in | Wt 211.0 lb

## 2019-03-14 DIAGNOSIS — Z125 Encounter for screening for malignant neoplasm of prostate: Secondary | ICD-10-CM | POA: Diagnosis not present

## 2019-03-14 DIAGNOSIS — N289 Disorder of kidney and ureter, unspecified: Secondary | ICD-10-CM

## 2019-03-14 DIAGNOSIS — Z8619 Personal history of other infectious and parasitic diseases: Secondary | ICD-10-CM

## 2019-03-14 DIAGNOSIS — I10 Essential (primary) hypertension: Secondary | ICD-10-CM

## 2019-03-14 DIAGNOSIS — R933 Abnormal findings on diagnostic imaging of other parts of digestive tract: Secondary | ICD-10-CM

## 2019-03-14 DIAGNOSIS — Z131 Encounter for screening for diabetes mellitus: Secondary | ICD-10-CM

## 2019-03-14 DIAGNOSIS — R7989 Other specified abnormal findings of blood chemistry: Secondary | ICD-10-CM | POA: Diagnosis not present

## 2019-03-14 DIAGNOSIS — Z21 Asymptomatic human immunodeficiency virus [HIV] infection status: Secondary | ICD-10-CM

## 2019-03-14 DIAGNOSIS — Z113 Encounter for screening for infections with a predominantly sexual mode of transmission: Secondary | ICD-10-CM

## 2019-03-14 DIAGNOSIS — Z1211 Encounter for screening for malignant neoplasm of colon: Secondary | ICD-10-CM

## 2019-03-14 DIAGNOSIS — B20 Human immunodeficiency virus [HIV] disease: Secondary | ICD-10-CM
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
## 2019-03-14 NOTE — Patient Instructions (Addendum)
Nice to see you today.   Please make an appointment in 2 months to repeat your kidney function test.   I would like to see you make an appointment with the internal medicine team to schedule a follow up so we can have them re-evaluate your blood pressure.   I would like for you to call the [facility] team to make an appointment with Dr. Hilarie Fredrickson to re-do your colonoscopy. He saw you last in 2017 so I will put in a new referral today. Main Line: [phone]  Please consider increasing your water intake - drink enough so your urine is clear to pale yellow. Yes you will be making more bathroom trips but this is a good thing!   Please come back to see [place] again in 4 months with labs prior to your visit.

## 2019-03-15 ENCOUNTER — Telehealth: Payer: Self-pay

## 2019-03-15 ENCOUNTER — Other Ambulatory Visit: Payer: Self-pay | Admitting: Infectious Diseases

## 2019-03-15 DIAGNOSIS — B20 Human immunodeficiency virus [HIV] disease: Secondary | ICD-10-CM

## 2019-03-15 LAB — HEMOGLOBIN A1C
Hgb A1c MFr Bld: 4.5 % of total Hgb (ref <5.7)
Mean Plasma Glucose: 82 (calc)
eAG (mmol/L): 4.6 (calc)

## 2019-03-15 LAB — PSA: PSA: 3.3 ng/mL (ref <=4.0)

## 2019-03-15 NOTE — Progress Notes (Signed)
Please call Max Ray to let him know that his prostate cancer screening came back negative and his diabetes test is negative.  Excellent news.

## 2019-03-15 NOTE — Telephone Encounter (Signed)
Attempted to call patient regarding lab results. Left voicemail requesting patient call office back. [place]

## 2019-03-15 NOTE — Telephone Encounter (Signed)
-----   Message from Alta Callas, NP sent at 03/15/2019 10:49 AM EST ----- Please call Max Ray to let him know that his prostate cancer screening came back negative and his diabetes test is negative.  Excellent news.

## 2019-03-24 NOTE — Assessment & Plan Note (Addendum)
Slightly worse with recent Cr 1.64. Encouraged him to drink water and decrease caffeine. BP medications likely need adjustment as well.  Will have him repeat in 2 months.   Lab Results  Component Value Date   CREATININE 1.64 (H) 02/11/2019   CREATININE 1.14 02/13/2018   CREATININE 1.20 05/18/2017

## 2019-03-24 NOTE — Assessment & Plan Note (Signed)
Last colonoscopy was 2017, recommended at that time to get follow up in 1 year partly due to poor prep. Will help make referral today.

## 2019-03-24 NOTE — Assessment & Plan Note (Signed)
BP Readings from Last 3 Encounters:  03/14/19 (!) 145/99  05/31/18 (!) 155/109  02/13/18 (!) 169/121   Persistently elevated. Needs follow up appointment with Dr. Daryll Drown and IM team.

## 2019-03-24 NOTE — Assessment & Plan Note (Signed)
Well controlled on [place]. No trouble taking with food and no side effects to his description. No drug interactions identified.  RTC in 4 months with labs prior to.  Need to work on getting him in with PCP - has not done this yet.

## 2019-03-24 NOTE — Assessment & Plan Note (Signed)
RPR stable 1:2. Rash on fingers not c/w syphilis.

## 2019-03-26 ENCOUNTER — Telehealth: Payer: Self-pay | Admitting: *Deleted

## 2019-03-26 NOTE — Telephone Encounter (Signed)
Patient returning call, states we left the message on his son's voicemail. RN confirmed and updated all contact information for patient.  His main emergency contact is his brother Deniece Portela with whom we can share any information.  His sister Leroy Kennedy and son Candler Denger. are listed as 2nd and 3rd contact per Kamon's wishes.  He would prefer if we only ask them to pass a message for Koven to contact his doctor's office.   Landis Gandy, RN

## 2019-04-17 ENCOUNTER — Encounter: Payer: Self-pay | Admitting: Internal Medicine

## 2019-05-10 ENCOUNTER — Ambulatory Visit (AMBULATORY_SURGERY_CENTER): Payer: Self-pay | Admitting: *Deleted

## 2019-05-10 ENCOUNTER — Other Ambulatory Visit: Payer: Self-pay

## 2019-05-10 VITALS — Temp 97.9°F | Ht 71.0 in | Wt 195.0 lb

## 2019-05-10 DIAGNOSIS — Z1211 Encounter for screening for malignant neoplasm of colon: Secondary | ICD-10-CM

## 2019-05-10 NOTE — Progress Notes (Signed)

## 2019-05-10 NOTE — Progress Notes (Signed)
Sample of Sutab provided.  Opted not to do 2 day prep. Pt states last time was lack of understanding and not constipation.

## 2019-05-16 ENCOUNTER — Encounter: Payer: Self-pay | Admitting: Internal Medicine

## 2019-05-22 ENCOUNTER — Encounter: Payer: Self-pay | Admitting: Internal Medicine

## 2019-05-22 ENCOUNTER — Other Ambulatory Visit: Payer: Self-pay

## 2019-05-22 ENCOUNTER — Ambulatory Visit (AMBULATORY_SURGERY_CENTER): Payer: Medicare Other | Admitting: Internal Medicine

## 2019-05-22 VITALS — BP 134/91 | HR 78 | Temp 97.5°F | Resp 18 | Ht 71.0 in | Wt 195.0 lb

## 2019-05-22 DIAGNOSIS — K635 Polyp of colon: Secondary | ICD-10-CM

## 2019-05-22 DIAGNOSIS — D129 Benign neoplasm of anus and anal canal: Secondary | ICD-10-CM

## 2019-05-22 DIAGNOSIS — D123 Benign neoplasm of transverse colon: Secondary | ICD-10-CM

## 2019-05-22 DIAGNOSIS — Z1211 Encounter for screening for malignant neoplasm of colon: Secondary | ICD-10-CM

## 2019-05-22 DIAGNOSIS — K621 Rectal polyp: Secondary | ICD-10-CM

## 2019-05-22 HISTORY — PX: COLONOSCOPY: SHX174

## 2019-05-22 MED ORDER — SODIUM CHLORIDE 0.9 % IV SOLN: 500.0000 mL | Freq: Once | INTRAVENOUS | Status: DC

## 2019-05-22 NOTE — Patient Instructions (Signed)
YOU HAD AN ENDOSCOPIC PROCEDURE TODAY AT [facility]:   Refer to the procedure report that was given to you for any specific questions about what was found during the examination.  If the procedure report does not answer your questions, please call your gastroenterologist to clarify.  If you requested that your care partner not be given the details of your procedure findings, then the procedure report has been included in a sealed envelope for you to review at your convenience later.  **Handouts given on Diverticulosis and Polyps**  YOU SHOULD EXPECT: Some feelings of bloating in the abdomen. Passage of more gas than usual.  Walking can help get rid of the air that was put into your GI tract during the procedure and reduce the bloating. If you had a lower endoscopy (such as a colonoscopy or flexible sigmoidoscopy) you may notice spotting of blood in your stool or on the toilet paper. If you underwent a bowel prep for your procedure, you may not have a normal bowel movement for a few days.  Please Note:  You might notice some irritation and congestion in your nose or some drainage.  This is from the oxygen used during your procedure.  There is no need for concern and it should clear up in a day or so.  SYMPTOMS TO REPORT IMMEDIATELY:   Following lower endoscopy (colonoscopy or flexible sigmoidoscopy):  Excessive amounts of blood in the stool  Significant tenderness or worsening of abdominal pains  Swelling of the abdomen that is new, acute  Fever of 100F or higher   For urgent or emergent issues, a gastroenterologist can be reached at any hour by calling [phone]. Do not use MyChart messaging for urgent concerns.    DIET:  We do recommend a small meal at first, but then you may proceed to your regular diet.  Drink plenty of fluids but you should avoid alcoholic beverages for 24 hours.  ACTIVITY:  You should plan to take it easy for the rest of today and you should NOT  DRIVE or use heavy machinery until tomorrow (because of the sedation medicines used during the test).    FOLLOW UP: Our staff will call the number listed on your records 48-72 hours following your procedure to check on you and address any questions or concerns that you may have regarding the information given to you following your procedure. If we do not reach you, we will leave a message.  We will attempt to reach you two times.  During this call, we will ask if you have developed any symptoms of COVID 19. If you develop any symptoms (ie: fever, flu-like symptoms, shortness of breath, cough etc.) before then, please call [phone].  If you test positive for Covid 19 in the 2 weeks post procedure, please call and report this information to [place].    If any biopsies were taken you will be contacted by phone or by letter within the next 1-3 weeks.  Please call us at [phone] if you have not heard about the biopsies in 3 weeks.    SIGNATURES/CONFIDENTIALITY: You and/or your care partner have signed paperwork which will be entered into your electronic medical record.  These signatures attest to the fact that that the information above on your After Visit Summary has been reviewed and is understood.  Full responsibility of the confidentiality of this discharge information lies with you and/or your care-partner.

## 2019-05-22 NOTE — Progress Notes (Signed)
Called to room to assist during endoscopic procedure.  Patient ID and intended procedure confirmed with present staff. Received instructions for my participation in the procedure from the performing physician.  

## 2019-05-22 NOTE — Op Note (Signed)
[place] Patient Name: Max Ray Procedure Date: 05/22/2019 1:11 PM MRN: [id number] Endoscopist: Jerene Bears , MD Age: 59 Referring MD:  Date of Birth: 1960-08-22 Gender: Male Account #: [id number] Procedure:                Colonoscopy Indications:              Screening for colorectal malignant neoplasm, last                            colonoscopy April 2017 with fair preparation Medicines:                Monitored Anesthesia Care Procedure:                Pre-Anesthesia Assessment:                           - Prior to the procedure, a History and Physical                            was performed, and patient medications and                            allergies were reviewed. The patient's tolerance of                            previous anesthesia was also reviewed. The risks                            and benefits of the procedure and the sedation                            options and risks were discussed with the patient.                            All questions were answered, and informed consent                            was obtained. Prior Anticoagulants: The patient has                            taken no previous anticoagulant or antiplatelet                            agents. ASA Grade Assessment: III - A patient with                            severe systemic disease. After reviewing the risks                            and benefits, the patient was deemed in                            satisfactory condition to undergo the procedure.  After obtaining informed consent, the colonoscope                            was passed under direct vision. Throughout the                            procedure, the patient's blood pressure, pulse, and                            oxygen saturations were monitored continuously. The                            Colonoscope was introduced through the anus and                            advanced to the  cecum, identified by appendiceal                            orifice and ileocecal valve. The colonoscopy was                            performed without difficulty. The patient tolerated                            the procedure well. The quality of the bowel                            preparation was good. The ileocecal valve,                            appendiceal orifice, and rectum were photographed. Scope In: 1:51:17 PM Scope Out: 2:05:01 PM Scope Withdrawal Time: 0 hours 12 minutes 4 seconds  Total Procedure Duration: 0 hours 13 minutes 44 seconds  Findings:                 The digital rectal exam was normal.                           A 2 mm polyp was found in the transverse colon. The                            polyp was sessile. The polyp was removed with a                            cold biopsy forceps. Resection and retrieval were                            complete.                           A 5 mm polyp was found in the rectum. The polyp was                            sessile. The polyp was removed  with a cold snare.                            Resection and retrieval were complete.                           A 6 mm polyp was found in the anus. The polyp was                            semi-sessile. Biopsies were taken with a cold                            forceps for histology.                           Multiple small-mouthed diverticula were found in                            the sigmoid colon and descending colon.                           No additional abnormalities were found on                            retroflexion. Complications:            No immediate complications. Estimated Blood Loss:     Estimated blood loss was minimal. Impression:               - One 2 mm polyp in the transverse colon, removed                            with a cold biopsy forceps. Resected and retrieved.                           - One 5 mm polyp in the rectum, removed with a cold                             snare. Resected and retrieved.                           - One 6 mm polyp at the anus. Biopsied.                           - Diverticulosis in the sigmoid colon and in the                            descending colon. Recommendation:           - Patient has a contact number available for                            emergencies. The signs and symptoms of potential                            delayed  complications were discussed with the                            patient. Return to normal activities tomorrow.                            Written discharge instructions were provided to the                            patient.                           - Resume previous diet.                           - Continue present medications.                           - Await pathology results.                           - Repeat colonoscopy is recommended. The                            colonoscopy date will be determined after pathology                            results from today's exam become available for                            review. Jerene Bears, MD 05/22/2019 2:12:06 PM This report has been signed electronically.

## 2019-05-22 NOTE — Progress Notes (Signed)
To PACU, VSS. Report to [email or web address] 

## 2019-05-22 NOTE — Progress Notes (Signed)
Pt's states no medical or surgical changes since previsit or office visit.  Temp JB Vitals CW 

## 2019-05-24 ENCOUNTER — Telehealth: Payer: Self-pay

## 2019-05-24 ENCOUNTER — Telehealth: Payer: Self-pay | Admitting: *Deleted

## 2019-05-24 NOTE — Telephone Encounter (Signed)
LVM

## 2019-05-24 NOTE — Telephone Encounter (Signed)
Message left

## 2019-07-15 ENCOUNTER — Other Ambulatory Visit: Payer: Medicare Other

## 2019-07-16 ENCOUNTER — Other Ambulatory Visit: Payer: Self-pay

## 2019-07-16 ENCOUNTER — Other Ambulatory Visit: Payer: Medicare Other

## 2019-07-16 ENCOUNTER — Other Ambulatory Visit (HOSPITAL_COMMUNITY)
Admission: RE | Admit: 2019-07-16 | Discharge: 2019-07-16 | Disposition: A | Discharge status: Home / Self Care | Payer: Medicare Other | Source: Ambulatory Visit | Attending: Infectious Diseases | Admitting: Infectious Diseases

## 2019-07-16 DIAGNOSIS — Z113 Encounter for screening for infections with a predominantly sexual mode of transmission: Secondary | ICD-10-CM | POA: Diagnosis present

## 2019-07-16 DIAGNOSIS — B20 Human immunodeficiency virus [HIV] disease: Secondary | ICD-10-CM

## 2019-07-16 DIAGNOSIS — R7989 Other specified abnormal findings of blood chemistry: Secondary | ICD-10-CM

## 2019-07-16 DIAGNOSIS — I10 Essential (primary) hypertension: Secondary | ICD-10-CM

## 2019-07-16 DIAGNOSIS — Z21 Asymptomatic human immunodeficiency virus [HIV] infection status: Secondary | ICD-10-CM

## 2019-07-16 MED ORDER — LOSARTAN POTASSIUM 50 MG PO TABS: 50.0000 mg | ORAL_TABLET | Freq: Every day | ORAL | 0 refills | Status: DC

## 2019-07-17 LAB — URINE CYTOLOGY ANCILLARY ONLY
Chlamydia: NEGATIVE
Comment: NEGATIVE
Comment: NORMAL
Neisseria Gonorrhea: NEGATIVE

## 2019-07-19 LAB — BASIC METABOLIC PANEL
BUN: 10 mg/dL (ref 7–25)
CO2: 28 mmol/L (ref 20–32)
Calcium: 9.7 mg/dL (ref 8.6–10.3)
Chloride: 104 mmol/L (ref 98–110)
Creat: 1.29 mg/dL (ref 0.70–1.33)
Glucose, Bld: 97 mg/dL (ref 65–99)
Potassium: 4.7 mmol/L (ref 3.5–5.3)
Sodium: 141 mmol/L (ref 135–146)

## 2019-07-19 LAB — HIV-1 RNA QUANT-NO REFLEX-BLD
HIV 1 RNA Quant: <20 copies/mL
HIV-1 RNA Quant, Log: <1.3 Log copies/mL

## 2019-07-19 LAB — RPR: RPR Ser Ql: REACTIVE — AB

## 2019-07-19 LAB — FLUORESCENT TREPONEMAL AB(FTA)-IGG-BLD: Fluorescent Treponemal ABS: REACTIVE — AB

## 2019-07-19 LAB — RPR TITER: RPR Titer: 1:2 {titer} — ABNORMAL HIGH

## 2019-08-01 ENCOUNTER — Encounter: Payer: Medicare Other | Admitting: Infectious Diseases

## 2019-08-20 ENCOUNTER — Other Ambulatory Visit: Payer: Self-pay | Admitting: Infectious Diseases

## 2019-08-20 DIAGNOSIS — B20 Human immunodeficiency virus [HIV] disease: Secondary | ICD-10-CM

## 2019-10-17 ENCOUNTER — Other Ambulatory Visit: Payer: Self-pay | Admitting: Infectious Diseases

## 2019-10-17 DIAGNOSIS — I10 Essential (primary) hypertension: Secondary | ICD-10-CM

## 2019-10-30 ENCOUNTER — Other Ambulatory Visit: Payer: Self-pay | Admitting: Infectious Diseases

## 2019-10-30 DIAGNOSIS — B20 Human immunodeficiency virus [HIV] disease: Secondary | ICD-10-CM

## 2019-11-05 ENCOUNTER — Encounter: Payer: Self-pay | Admitting: Infectious Diseases

## 2019-11-05 ENCOUNTER — Ambulatory Visit: Payer: Medicare Other

## 2019-11-05 ENCOUNTER — Other Ambulatory Visit: Payer: Self-pay

## 2019-11-08 ENCOUNTER — Telehealth: Payer: Self-pay

## 2019-11-08 NOTE — Telephone Encounter (Signed)
Called patient to get an appointment scheduled, left a voicemail to call us and get that scheduled

## 2019-11-20 ENCOUNTER — Ambulatory Visit: Payer: Medicare Other | Admitting: Infectious Diseases

## 2019-11-20 ENCOUNTER — Other Ambulatory Visit: Payer: Self-pay

## 2019-11-20 ENCOUNTER — Encounter: Payer: Self-pay | Admitting: Infectious Diseases

## 2019-11-20 VITALS — BP 127/80 | HR 100 | Resp 16 | Ht 71.0 in | Wt 195.0 lb

## 2019-11-20 DIAGNOSIS — F419 Anxiety disorder, unspecified: Secondary | ICD-10-CM | POA: Diagnosis not present

## 2019-11-20 DIAGNOSIS — F32A Depression, unspecified: Secondary | ICD-10-CM

## 2019-11-20 DIAGNOSIS — Z21 Asymptomatic human immunodeficiency virus [HIV] infection status: Secondary | ICD-10-CM | POA: Diagnosis not present

## 2019-11-20 DIAGNOSIS — B20 Human immunodeficiency virus [HIV] disease: Secondary | ICD-10-CM | POA: Diagnosis not present

## 2019-11-20 NOTE — Progress Notes (Signed)
Name: KAMERON BLETHEN  DOB: May 23, 1960 MRN: [id number] PCP: Sid Falcon, MD     Brief Narrative:  Peirce Deveney is a 59 y.o. male with HIV.  CD4 nadir unknown  HIV Risk: MSM/Bisexual History of OIs: None known Intake Labs: 2013 Hep B sAg (-), sAb (+); Hep A (vax 2014), Hep C (-) Quantiferon (-) HLA B*5701 ([) G6PD: ()   Previous Regimens: . Complera . Odefsey >> undetectable   Genotypes: . None on file   Subjective:   Chief Complaint  Patient presents with  . Follow-up    b20     HPI: He has had a few low moments since our last office visit - he has realized that he has been suffering from depression with the social isolation and limited contact with people. He was drinking heavily at a time and struggling with insomnia. He has been working with his family closely for support and actually went for mental health assessment.   He has been off alcohol for 2 months now and started a few supplements that help his anxiety immensely. He has lost 10 lbs now and his blood pressure is much improved. He feels like he has excellent support now moving forward.     Review of Systems  Constitutional: Negative for chills, fever, malaise/fatigue and weight loss.  HENT: Negative for sore throat.   Respiratory: Negative for cough, sputum production and shortness of breath.   Cardiovascular: Negative.   Gastrointestinal: Negative for abdominal pain, diarrhea and vomiting.  Musculoskeletal: Negative for joint pain, myalgias and neck pain.  Skin: Negative for rash.  Neurological: Negative for headaches.  Psychiatric/Behavioral: Positive for depression. Negative for substance abuse. The patient has insomnia. The patient is not nervous/anxious.   12 point review of systems reviewed  Past Medical History:  Diagnosis Date  . Anxiety   . HIV disease ([place])   . Hypertension   . Pneumonia   . Syphilis     Outpatient Medications Prior to Visit  Medication Sig Dispense Refill   . amLODipine (NORVASC) 5 MG tablet Take 1 tablet (5 mg total) by mouth daily. 90 tablet 1  . ASHWAGANDHA PO Take by mouth.    . fluticasone (FLONASE) 50 MCG/ACT nasal spray SHAKE LIQUID AND USE 1 SPRAY IN EACH NOSTRIL DAILY 16 g 11  . Loratadine (CLARITIN PO) Take 10 mg by mouth daily. Equate brand    . Magnesium 400 MG CAPS Take 1 capsule by mouth daily.    . Misc Natural Products (IMMUNE TRIO PO) Take 1 tablet by mouth.    . Misc Natural Products (OSTEO BI-FLEX ADV JOINT SHIELD) TABS Take 1 tablet by mouth.    . Misc Natural Products (PROSTATE HEALTH) CAPS Take 1 capsule by mouth daily.    . Multiple Vitamin (MULTIVITAMIN) tablet Take 1 tablet by mouth daily. Men +50 Centrum    . TURMERIC PO Take by mouth.    . losartan (COZAAR) 50 MG tablet TAKE 1 TABLET(50 MG) BY MOUTH DAILY 90 tablet 0  . ODEFSEY 200-25-25 MG TABS tablet TAKE 1 TABLET BY MOUTH DAILY 30 tablet 0   No facility-administered medications prior to visit.     Allergies  Allergen Reactions  . Sulfamethoxazole-Trimethoprim Other (See Comments)    Unknown allergic reaction  . Sulfonamide Derivatives     Social History   Tobacco Use  . Smoking status: Never Smoker  . Smokeless tobacco: Never Used  Substance Use Topics  . Alcohol use: Not Currently  Alcohol/week: 1.0 standard drink    Types: 1 Standard drinks or equivalent per week    Comment: social  . Drug use: No     Social History   Substance and Sexual Activity  Sexual Activity Yes  . Partners: Male  . Birth control/protection: Condom   Comment: pt. given condoms     Objective:   Vitals:   11/20/19 1618  BP: 127/80  Pulse: 100  Resp: 16  SpO2: 93%  Weight: 195 lb (88.5 kg)  Height: 5' 11"  (1.803 m)   Body mass index is 27.2 kg/m.  Physical Exam Constitutional:      Appearance: Normal appearance. He is not ill-appearing.  HENT:     Head: Normocephalic.     Mouth/Throat:     Mouth: Mucous membranes are moist.     Pharynx: Oropharynx  is clear.  Eyes:     General: No scleral icterus. Pulmonary:     Effort: Pulmonary effort is normal.  Musculoskeletal:        General: Normal range of motion.     Cervical back: Normal range of motion.  Skin:    Coloration: Skin is not jaundiced or pale.  Neurological:     Mental Status: He is alert and oriented to person, place, and time.  Psychiatric:        Mood and Affect: Mood normal.        Judgment: Judgment normal.     Lab Results Lab Results  Component Value Date   WBC 14.0 (H) 02/11/2019   HGB 15.9 02/11/2019   HCT 45.5 02/11/2019   MCV 100.0 02/11/2019   PLT 302 02/11/2019    Lab Results  Component Value Date   CREATININE 1.29 07/16/2019   BUN 10 07/16/2019   NA 141 07/16/2019   K 4.7 07/16/2019   CL 104 07/16/2019   CO2 28 07/16/2019    Lab Results  Component Value Date   ALT 26 02/11/2019   AST 30 02/11/2019   ALKPHOS 69 04/27/2016   BILITOT 1.0 02/11/2019    Lab Results  Component Value Date   CHOL 199 02/11/2019   HDL 64 02/11/2019   LDLCALC 94 02/11/2019   TRIG 286 (H) 02/11/2019   CHOLHDL 3.1 02/11/2019   HIV 1 RNA Quant  Date Value  11/20/2019 <20 Copies/mL  07/16/2019 <20 NOT DETECTED copies/mL  02/11/2019 <20 NOT DETECTED copies/mL   CD4 T Cell Abs (/uL)  Date Value  11/20/2019 1,011  02/11/2019 1,286  02/13/2018 1,940     Assessment & Plan:   Problem List Items Addressed This Visit      Unprioritized   Human immunodeficiency virus (HIV) disease ([place]) (Chronic)    Will update pertinent labs for routine monitoring today. He will continue Odefsey once daily with food. Will try to get him set up with mychart for access. He always seems interested then declines later.  FU in 4-6 months        Anxiety and depression    Much improved following cessation of alcohol and addition of some helpful supplements to his regimen. No concerning interactions with review.  Congratulated him--offered support with our clinic should he need  additional resources.  Counseled to try to get better sleep as well.        Other Visit Diagnoses    Asymptomatic HIV infection, with no history of HIV-related illness ([place])    -  Primary   Relevant Orders   HIV-1 RNA quant-no reflex-bld (Completed)  T-helper cell (CD4)- ([facility] clinic only) (Completed)      Janene Madeira, MSN, NP-C [facility] Pager: [phone] Office: [phone]  01/28/20  10:09 PM

## 2019-11-20 NOTE — Patient Instructions (Addendum)
So nice to see you  Please return in 4 months   Please sign up with MyChart to access your labs and set up email communication with our clinic for non-urgent medical concerns.

## 2019-11-21 LAB — T-HELPER CELL (CD4) - (RCID CLINIC ONLY)
CD4 % Helper T Cell: 42 % (ref 33–65)
CD4 T Cell Abs: 1011 /uL (ref 400–1790)

## 2019-11-22 LAB — HIV-1 RNA QUANT-NO REFLEX-BLD
HIV 1 RNA Quant: <20 Copies/mL
HIV-1 RNA Quant, Log: <1.3 Log cps/mL

## 2019-11-25 ENCOUNTER — Telehealth: Payer: Self-pay

## 2019-11-25 NOTE — Telephone Encounter (Signed)
-----   Message from [place] Callas, NP sent at 11/25/2019 12:53 PM EST ----- Please give Max Ray a call to let him know that his viral load is undetectable and his immune system looks awesome - t cells are 1011.  I was hopeful to get him set up for MyChart so maybe a reminder to have him download the app so he can see his lab results. (we talked about this at our visit recently)  No changes from me.

## 2019-11-25 NOTE — Telephone Encounter (Signed)
Spoke with the patient to relay that his viral load is undetectable and his immune system looks great with his T cell count at 1,011 per Janene Madeira, NP. Sent patient MyChart setup link to his preferred phone number. Patient appreciative of call and has no further questions.   Beryle Flock, RN

## 2019-12-07 ENCOUNTER — Other Ambulatory Visit: Payer: Self-pay | Admitting: Infectious Diseases

## 2019-12-07 DIAGNOSIS — B20 Human immunodeficiency virus [HIV] disease: Secondary | ICD-10-CM

## 2020-01-17 ENCOUNTER — Other Ambulatory Visit: Payer: Self-pay | Admitting: Infectious Diseases

## 2020-01-17 DIAGNOSIS — I10 Essential (primary) hypertension: Secondary | ICD-10-CM

## 2020-01-28 NOTE — Assessment & Plan Note (Signed)
Will update pertinent labs for routine monitoring today. He will continue Odefsey once daily with food. Will try to get him set up with mychart for access. He always seems interested then declines later.  FU in 4-6 months

## 2020-01-28 NOTE — Assessment & Plan Note (Signed)
Much improved following cessation of alcohol and addition of some helpful supplements to his regimen. No concerning interactions with review.  Congratulated him--offered support with our clinic should he need additional resources.  Counseled to try to get better sleep as well.

## 2020-02-11 ENCOUNTER — Other Ambulatory Visit: Payer: Self-pay

## 2020-02-11 ENCOUNTER — Ambulatory Visit (INDEPENDENT_AMBULATORY_CARE_PROVIDER_SITE_OTHER): Payer: Medicare HMO | Admitting: Infectious Diseases

## 2020-02-11 ENCOUNTER — Ambulatory Visit
Admission: RE | Admit: 2020-02-11 | Discharge: 2020-02-11 | Disposition: A | Discharge status: Home / Self Care | Payer: Medicare HMO | Source: Ambulatory Visit | Attending: Infectious Diseases | Admitting: Infectious Diseases

## 2020-02-11 ENCOUNTER — Encounter: Payer: Self-pay | Admitting: Infectious Diseases

## 2020-02-11 VITALS — BP 171/112 | HR 90 | Temp 97.7°F | Wt 219.8 lb

## 2020-02-11 DIAGNOSIS — G8929 Other chronic pain: Secondary | ICD-10-CM | POA: Diagnosis not present

## 2020-02-11 DIAGNOSIS — M79645 Pain in left finger(s): Secondary | ICD-10-CM | POA: Diagnosis not present

## 2020-02-11 DIAGNOSIS — N529 Male erectile dysfunction, unspecified: Secondary | ICD-10-CM

## 2020-02-11 DIAGNOSIS — R600 Localized edema: Secondary | ICD-10-CM | POA: Diagnosis not present

## 2020-02-11 NOTE — Assessment & Plan Note (Signed)
Seems like trigger finger with audible snapping on exam with flexion movement. Will check Xray to ensure no bony deformities and consider referral to orthopedics to help with management and consideration of injection.

## 2020-02-11 NOTE — Patient Instructions (Addendum)
You have swollen parotid glands - now we are going to try to figure out why.  Please stop the lab on your way out. It may   Biotene is my favorite mouth rinse - I want you to start using this instead of Listerine. They also make gels, sprays and sucking candy.  Try also using some sour sucking candy to help try to improve the saliva in your mouth.   For your thumb please stop by [facility] across the hall for an xray of your left thumb.

## 2020-02-11 NOTE — Assessment & Plan Note (Signed)
Would like his testosterone levels checked, little late for fasting but he just woke up about 1 hour before he came for appt. Will check and supplement as needed. Can also give rx for medication with cialis or viagra if he is interested.

## 2020-02-11 NOTE — Assessment & Plan Note (Addendum)
Max Ray noticed swelling in bilateral parotid glands now for 2 months. He is very bothered and self conscious by the appearance of this and worried it is caused by something terrible. He does have some associated dry mouth and what sounds to be unrelated joint pain in the left thumb. No constitutional symptoms. Differential could be HIV-related, Sjogrens / autoimmune process, masseter hypertrophy or neoplastic process.  Will run battery of labs today and determine utility of imaging (ultrasound vs CT scan). On exam he has bilateral palpable parotid glands without any masses. Non painful.  Conservative measures with biotene products and sour candies for now while we wait.

## 2020-02-11 NOTE — Progress Notes (Signed)
Name: Max Ray  DOB: 11-Feb-1960 MRN: [id number] PCP: Sid Falcon, MD    Brief Narrative:  Max Ray is a 60 y.o. male with well controlled HIV.  CD4 nadir unknown  HIV Risk: MSM/Bisexual History of OIs: None known Intake Labs: 2013 Hep B sAg (-), sAb (+); Hep A (vax 2014), Hep C (-) Quantiferon (-) HLA B*5701 ([) G6PD: ()   Previous Regimens: . Complera . Odefsey >> undetectable   Genotypes: . None on file   Subjective:   No chief complaint on file.    HPI: Here today due to concern over swollen lymph nodes in his cheeks on the sides of cheeks. Does have associated dry mouth that wakes him up at night. COVID booster shot now about 4 months ago. No fevers, chills or weight loss. Does feel like he has intermittent sweating at night.   Left thumb pain that started 6 months ago. No injury that he recalls but he states that the previous alcohol may have produce an injury. It is painful when he flexes the thumb down.   He also describes a lump on the left deltoid that is stable now about 8-9 years. Has not changed. Has had some lipomas in the past.     Review of Systems  Constitutional: Positive for diaphoresis. Negative for chills, fever, malaise/fatigue and weight loss.  HENT: Negative for sore throat.        Dry mouth  Swelling of glands in cheeks  Respiratory: Negative for cough, sputum production and shortness of breath.   Cardiovascular: Negative for chest pain and leg swelling.  Gastrointestinal: Negative for abdominal pain, diarrhea and vomiting.  Musculoskeletal: Positive for joint pain. Negative for myalgias and neck pain.  Skin: Negative for rash.  Neurological: Negative for headaches.  Psychiatric/Behavioral: Negative for depression and substance abuse. The patient is not nervous/anxious and does not have insomnia.     Past Medical History:  Diagnosis Date  . Anxiety   . HIV disease ([place])   . Hypertension   . Pneumonia   . Syphilis      Outpatient Medications Prior to Visit  Medication Sig Dispense Refill  . ASHWAGANDHA PO Take by mouth.    . fluticasone (FLONASE) 50 MCG/ACT nasal spray SHAKE LIQUID AND USE 1 SPRAY IN EACH NOSTRIL DAILY 16 g 11  . Loratadine (CLARITIN PO) Take 10 mg by mouth daily. Equate brand    . losartan (COZAAR) 50 MG tablet TAKE 1 TABLET(50 MG) BY MOUTH DAILY 90 tablet 0  . Magnesium 400 MG CAPS Take 1 capsule by mouth daily.    . Misc Natural Products (IMMUNE TRIO PO) Take 1 tablet by mouth.    . Misc Natural Products (OSTEO BI-FLEX ADV JOINT SHIELD) TABS Take 1 tablet by mouth.    . Misc Natural Products (PROSTATE HEALTH) CAPS Take 1 capsule by mouth daily.    . Multiple Vitamin (MULTIVITAMIN) tablet Take 1 tablet by mouth daily. Men +50 Centrum    . ODEFSEY 200-25-25 MG TABS tablet TAKE 1 TABLET BY MOUTH DAILY 30 tablet 4  . TURMERIC PO Take by mouth.    [place] Kitchen amLODipine (NORVASC) 5 MG tablet Take 1 tablet (5 mg total) by mouth daily. 90 tablet 1   No facility-administered medications prior to visit.     Allergies  Allergen Reactions  . Sulfamethoxazole-Trimethoprim Other (See Comments)    Unknown allergic reaction  . Sulfonamide Derivatives     Social History   Tobacco Use  .  Smoking status: Never Smoker  . Smokeless tobacco: Never Used  Substance Use Topics  . Alcohol use: Not Currently    Alcohol/week: 1.0 standard drink    Types: 1 Standard drinks or equivalent per week    Comment: social  . Drug use: No     Social History   Substance and Sexual Activity  Sexual Activity Yes  . Partners: Male  . Birth control/protection: Condom   Comment: pt. given condoms     Objective:   Vitals:   02/11/20 0915 02/11/20 1001  BP: (!) 176/119 (!) 171/112  Pulse: 87 90  Temp: 97.7 F (36.5 C)   TempSrc: Oral   Weight: 219 lb 12.8 oz (99.7 kg)    Body mass index is 30.66 kg/m.  Physical Exam Constitutional:      Appearance: Normal appearance. He is not ill-appearing.   HENT:     Head: Normocephalic.     Mouth/Throat:     Mouth: Mucous membranes are moist.     Pharynx: Oropharynx is clear.  Eyes:     General: No scleral icterus. Pulmonary:     Effort: Pulmonary effort is normal.  Musculoskeletal:        General: Normal range of motion.     Cervical back: Normal range of motion.  Skin:    Coloration: Skin is not jaundiced or pale.  Neurological:     Mental Status: He is alert and oriented to person, place, and time.  Psychiatric:        Mood and Affect: Mood normal.        Judgment: Judgment normal.     Lab Results Lab Results  Component Value Date   WBC 14.0 (H) 02/11/2019   HGB 15.9 02/11/2019   HCT 45.5 02/11/2019   MCV 100.0 02/11/2019   PLT 302 02/11/2019    Lab Results  Component Value Date   CREATININE 1.29 07/16/2019   BUN 10 07/16/2019   NA 141 07/16/2019   K 4.7 07/16/2019   CL 104 07/16/2019   CO2 28 07/16/2019    Lab Results  Component Value Date   ALT 26 02/11/2019   AST 30 02/11/2019   ALKPHOS 69 04/27/2016   BILITOT 1.0 02/11/2019    Lab Results  Component Value Date   CHOL 199 02/11/2019   HDL 64 02/11/2019   LDLCALC 94 02/11/2019   TRIG 286 (H) 02/11/2019   CHOLHDL 3.1 02/11/2019   HIV 1 RNA Quant  Date Value  11/20/2019 <20 Copies/mL  07/16/2019 <20 NOT DETECTED copies/mL  02/11/2019 <20 NOT DETECTED copies/mL   CD4 T Cell Abs (/uL)  Date Value  11/20/2019 1,011  02/11/2019 1,286  02/13/2018 1,940     Assessment & Plan:   Patient Active Problem List   Diagnosis Date Noted  . Swelling of both parotid glands 02/11/2020  . Chronic pain of left thumb 02/11/2020  . Osteoarthritis of right knee 11/08/2016  . Prostate hypertrophy 04/27/2016  . Abnormal CT scan, colon 03/25/2015  . IBS (irritable bowel syndrome) 04/08/2014  . Erectile dysfunction 03/15/2012  . Renal insufficiency 06/22/2011  . Microalbuminuria 06/22/2011  . TIA (transient ischemic attack) 03/22/2011  . HTN (hypertension)  03/22/2011  . PTSD (post-traumatic stress disorder) 03/22/2011  . Hypogonadism male 03/22/2011  . Macrocytic anemia 03/22/2011  . Anxiety and depression 03/22/2011  . Periodontal disease 02/14/2011  . History of syphilis 03/09/2007  . GLUCOSE-6-PHOSPHATE DEHYDROGENASE DEFICIENCY 03/09/2007  . Allergic sinusitis 01/24/2007  . PRESBYOPIA 06/19/2006  . Human  immunodeficiency virus (HIV) disease ([place]) 10/04/2005  . GENITAL HERPES 10/04/2005     Problem List Items Addressed This Visit      Unprioritized   Swelling of both parotid glands - Primary    [place] noticed swelling in bilateral parotid glands now for 2 months. He is very bothered and self conscious by the appearance of this and worried it is caused by something terrible. He does have some associated dry mouth and what sounds to be unrelated joint pain in the left thumb. No constitutional symptoms. Differential could be HIV-related, Sjogrens / autoimmune process, masseter hypertrophy or neoplastic process.  Will run battery of labs today and determine utility of imaging (ultrasound vs CT scan). On exam he has bilateral palpable parotid glands without any masses. Non painful.  Conservative measures with biotene products and sour candies for now while we wait.       Relevant Orders   Sedimentation rate   C-reactive protein   ANA   CBC   HIV-1 RNA quant-no reflex-bld   Sjogrens syndrome-A extractable nuclear antibody   Sjogrens syndrome-B extractable nuclear antibody   Hemoglobin A1c   Hepatic function panel   Lactate dehydrogenase   Erectile dysfunction    Would like his testosterone levels checked, little late for fasting but he just woke up about 1 hour before he came for appt. Will check and supplement as needed. Can also give rx for medication with cialis or viagra if he is interested.       Relevant Orders   Testosterone Total,Free,Bio, Males-([facility])   Chronic pain of left thumb    Seems like trigger finger with audible  snapping on exam with flexion movement. Will check Xray to ensure no bony deformities and consider referral to orthopedics to help with management and consideration of injection.       Relevant Orders   DG Finger Thumb Left      Janene Madeira, MSN, NP-C [place] [place] Pager: [phone] Office: [phone]  02/11/20  10:04 AM

## 2020-02-12 ENCOUNTER — Telehealth: Payer: Self-pay

## 2020-02-12 NOTE — Telephone Encounter (Signed)
-----   Message from [place] Callas, NP sent at 02/12/2020 12:53 PM EST ----- Please give Max Ray a call to let him know that there is no abnormality seen on xray of his left thumb.  I suspect he has "trigger finger" that is due to some inflammation of a tendon or ligament (tissue that holds muscles and bones together). I would encourage him to use ice applications 15 minutes at a time 2-4 times a day to see if that helps decrease inflammation. Would try tylenol first for pain - 1-2 extra strength every 8 hours as needed. Would take it consistently for a few days in a row to try to help get on top of the pain.   Next step would be consideration to refer him to orthopedics for consideration of treatment / injection to the joint.   His other labs are all still pending at this time - will decide on imaging for him next week once all studies are back

## 2020-02-12 NOTE — Telephone Encounter (Signed)
Called patient to relay message from NP. Patient does not have any questions at this time. Will call office back with any additional concerns.

## 2020-02-12 NOTE — Progress Notes (Signed)
Please give Max Ray a call to let him know that there is no abnormality seen on xray of his left thumb.  I suspect he has "trigger finger" that is due to some inflammation of a tendon or ligament (tissue that holds muscles and bones together). I would encourage him to use ice applications 15 minutes at a time 2-4 times a day to see if that helps decrease inflammation. Would try tylenol first for pain - 1-2 extra strength every 8 hours as needed. Would take it consistently for a few days in a row to try to help get on top of the pain.   Next step would be consideration to refer him to orthopedics for consideration of treatment / injection to the joint.   His other labs are all still pending at this time - will decide on imaging for him next week once all studies are back

## 2020-02-14 LAB — TESTOSTERONE TOTAL,FREE,BIO, MALES
Albumin: 4.4 g/dL (ref 3.6–5.1)
Sex Hormone Binding: 40 nmol/L (ref 22–77)
Testosterone: 241 ng/dL — ABNORMAL LOW (ref 250–827)

## 2020-02-14 LAB — CBC
HCT: 45.3 % (ref 38.5–50.0)
Hemoglobin: 16.1 g/dL (ref 13.2–17.1)
MCH: 33.5 pg — ABNORMAL HIGH (ref 27.0–33.0)
MCHC: 35.5 g/dL (ref 32.0–36.0)
MCV: 94.4 fL (ref 80.0–100.0)
MPV: 10.1 fL (ref 7.5–12.5)
Platelets: 276 10*3/uL (ref 140–400)
RBC: 4.8 10*6/uL (ref 4.20–5.80)
RDW: 11.5 % (ref 11.0–15.0)
WBC: 8.3 10*3/uL (ref 3.8–10.8)

## 2020-02-14 LAB — HEPATIC FUNCTION PANEL
AG Ratio: 1.2 (calc) (ref 1.0–2.5)
ALT: 21 U/L (ref 9–46)
AST: 25 U/L (ref 10–35)
Albumin: 4.4 g/dL (ref 3.6–5.1)
Alkaline phosphatase (APISO): 71 U/L (ref 35–144)
Bilirubin, Direct: 0.1 mg/dL (ref 0.0–0.2)
Globulin: 3.7 g/dL (calc) (ref 1.9–3.7)
Indirect Bilirubin: 0.3 mg/dL (calc) (ref 0.2–1.2)
Total Bilirubin: 0.4 mg/dL (ref 0.2–1.2)
Total Protein: 8.1 g/dL (ref 6.1–8.1)

## 2020-02-14 LAB — LACTATE DEHYDROGENASE: LDH: 148 U/L (ref 120–250)

## 2020-02-14 LAB — HEMOGLOBIN A1C
Hgb A1c MFr Bld: 4.9 % of total Hgb (ref <5.7)
Mean Plasma Glucose: 94 mg/dL
eAG (mmol/L): 5.2 mmol/L

## 2020-02-14 LAB — SJOGRENS SYNDROME-A EXTRACTABLE NUCLEAR ANTIBODY: SSA (Ro) (ENA) Antibody, IgG: <1 AI

## 2020-02-14 LAB — HIV-1 RNA QUANT-NO REFLEX-BLD
HIV 1 RNA Quant: <20 Copies/mL
HIV-1 RNA Quant, Log: <1.3 Log cps/mL

## 2020-02-14 LAB — SJOGRENS SYNDROME-B EXTRACTABLE NUCLEAR ANTIBODY: SSB (La) (ENA) Antibody, IgG: <1 AI

## 2020-02-14 LAB — C-REACTIVE PROTEIN: CRP: 7.6 mg/L (ref <8.0)

## 2020-02-14 LAB — ANA: Anti Nuclear Antibody (ANA): NEGATIVE

## 2020-02-14 LAB — SEDIMENTATION RATE: Sed Rate: 11 mm/h (ref 0–20)

## 2020-02-19 ENCOUNTER — Ambulatory Visit (INDEPENDENT_AMBULATORY_CARE_PROVIDER_SITE_OTHER): Payer: Medicare HMO

## 2020-02-19 ENCOUNTER — Ambulatory Visit: Payer: Medicare HMO | Admitting: Orthopaedic Surgery

## 2020-02-19 ENCOUNTER — Encounter: Payer: Self-pay | Admitting: Orthopaedic Surgery

## 2020-02-19 ENCOUNTER — Ambulatory Visit: Payer: Self-pay

## 2020-02-19 DIAGNOSIS — M25561 Pain in right knee: Secondary | ICD-10-CM

## 2020-02-19 DIAGNOSIS — G8929 Other chronic pain: Secondary | ICD-10-CM | POA: Diagnosis not present

## 2020-02-19 DIAGNOSIS — M25562 Pain in left knee: Secondary | ICD-10-CM

## 2020-02-19 MED ORDER — BUPIVACAINE HCL 0.25 % IJ SOLN
2.0000 mL | INTRAMUSCULAR | Status: AC | PRN
  Administered 2020-02-19: 2 mL via INTRA_ARTICULAR

## 2020-02-19 MED ORDER — LIDOCAINE HCL 1 % IJ SOLN
2.0000 mL | INTRAMUSCULAR | Status: AC | PRN
  Administered 2020-02-19: 2 mL

## 2020-02-19 MED ORDER — METHYLPREDNISOLONE ACETATE 40 MG/ML IJ SUSP
40.0000 mg | INTRAMUSCULAR | Status: AC | PRN
  Administered 2020-02-19: 40 mg via INTRA_ARTICULAR

## 2020-02-19 NOTE — Progress Notes (Signed)
Office Visit Note   Patient: Max Ray           Date of Birth: Feb 20, 1960           MRN: [id number] Visit Date: 02/19/2020              Requested by: Sid Falcon, MD [place],  [place] [address] PCP: Sid Falcon, MD   Assessment & Plan: Visit Diagnoses:  1. Chronic pain of left knee     Plan: Impression is left knee arthritis flareup. We did discuss various treatment options to include aspiration and cortisone injection for which he would like to proceed. He will follow up with [place] as needed.  Follow-Up Instructions: Return if symptoms worsen or fail to improve.   Orders:  Orders Placed This Encounter  Procedures  . XR KNEE 3 VIEW LEFT  . XR KNEE 3 VIEW RIGHT   No orders of the defined types were placed in this encounter.     Procedures: Large Joint Inj: L knee on 02/19/2020 10:56 AM Indications: pain Details: 22 G needle, anterolateral approach Medications: 2 mL lidocaine 1 %; 2 mL bupivacaine 0.25 %; 40 mg methylPREDNISolone acetate 40 MG/ML      Clinical Data: No additional findings.   Subjective: Chief Complaint  Patient presents with  . Left Knee - Pain  . Right Knee - Pain    HPI patient is a 60 year old gentleman who comes in today with left knee pain for the past week. He notes that this began after "rolling around in the hay" with a friend last week. He has had pain and swelling to the entire knee since. His swelling has somewhat improved. He has increased pain with ambulation. He has been taking Tylenol without significant relief of symptoms. He does have a history of right knee pain back in 2018 for which she had a subsequent aspiration and injection which did significantly help.  Review of Systems as detailed in HPI. All others reviewed and are negative.   Objective: Vital Signs: There were no vitals taken for this visit.  Physical Exam well-developed and well-nourished gentleman in no acute distress. Alert oriented  x3.  Ortho Exam left knee exam shows a small to moderate effusion. Range of motion 0 to 110 degrees. No joint line tenderness. Ligaments are stable. He is neurovascular intact distally.  Specialty Comments:  No specialty comments available.  Imaging: XR KNEE 3 VIEW LEFT  Result Date: 02/19/2020 Moderate joint space narrowing medial and patellofemoral compartments  XR KNEE 3 VIEW RIGHT  Result Date: 02/19/2020 Moderate joint space narrowing medial and patellofemoral compartments    PMFS History: Patient Active Problem List   Diagnosis Date Noted  . Swelling of both parotid glands 02/11/2020  . Chronic pain of left thumb 02/11/2020  . Osteoarthritis of right knee 11/08/2016  . Prostate hypertrophy 04/27/2016  . Abnormal CT scan, colon 03/25/2015  . IBS (irritable bowel syndrome) 04/08/2014  . Erectile dysfunction 03/15/2012  . Renal insufficiency 06/22/2011  . Microalbuminuria 06/22/2011  . TIA (transient ischemic attack) 03/22/2011  . HTN (hypertension) 03/22/2011  . PTSD (post-traumatic stress disorder) 03/22/2011  . Hypogonadism male 03/22/2011  . Macrocytic anemia 03/22/2011  . Anxiety and depression 03/22/2011  . Periodontal disease 02/14/2011  . History of syphilis 03/09/2007  . GLUCOSE-6-PHOSPHATE DEHYDROGENASE DEFICIENCY 03/09/2007  . Allergic sinusitis 01/24/2007  . PRESBYOPIA 06/19/2006  . Human immunodeficiency virus (HIV) disease ([place]) 10/04/2005  . GENITAL HERPES 10/04/2005  Past Medical History:  Diagnosis Date  . Anxiety   . HIV disease ([place])   . Hypertension   . Pneumonia   . Syphilis     Family History  Problem Relation Age of Onset  . Breast cancer Paternal Aunt        x 3  . Pancreatic cancer Maternal Grandfather   . Colon cancer Maternal Grandfather   . Other Father        prostate disease  . Esophageal cancer Neg Hx   . Rectal cancer Neg Hx   . Stomach cancer Neg Hx   . Colon polyps Neg Hx     Past Surgical History:  Procedure  Laterality Date  . COLONOSCOPY  05/22/2019   2017-anal condyloma  . KNEE ARTHROSCOPY Right    Social History   Occupational History  . Occupation: disabled  Tobacco Use  . Smoking status: Never Smoker  . Smokeless tobacco: Never Used  Substance and Sexual Activity  . Alcohol use: Not Currently    Alcohol/week: 1.0 standard drink    Types: 1 Standard drinks or equivalent per week    Comment: social  . Drug use: No  . Sexual activity: Yes    Partners: Male    Birth control/protection: Condom    Comment: pt. given condoms

## 2020-02-25 ENCOUNTER — Telehealth: Payer: Self-pay

## 2020-02-25 NOTE — Progress Notes (Signed)
Please give Max Ray a call to let him know that all of his blood work was completely normal. Everything is extremely reassuring to me. No inflammation or concern for cancer, uncontrolled HIV (his VL is undetectable still) and no autoimmune process I can find in the blood.   Can you get an update from him how his dry mouth and glands are?   At this point if things are no worse we can continue to monitor. If he feels like swelling is getting worse we can arrange for the ultrasound of his parotid glands like we discussed.

## 2020-02-25 NOTE — Telephone Encounter (Signed)
Patient advised of lab results and verbalized understanding. Patient stated his dry mouth and glands are better and does not feel he needs an [place] at this time Patient using Biotene mouth wash and spray at this time for the dry mouth.

## 2020-02-25 NOTE — Telephone Encounter (Signed)
I called patient to relay lab results and patient did not answer.  I have left a message for patient to call back on a secured voicemail. Max Ray Brooks Sailors

## 2020-02-25 NOTE — Telephone Encounter (Signed)
-----   Message from [place] Callas, NP sent at 02/25/2020 10:08 AM EST ----- Please give Max Ray a call to let him know that all of his blood work was completely normal. Everything is extremely reassuring to me. No inflammation or concern for cancer, uncontrolled HIV (his VL is undetectable still) and no autoimmune process I can find in the blood.   Can you get an update from him how his dry mouth and glands are?   At this point if things are no worse we can continue to monitor. If he feels like swelling is getting worse we can arrange for the ultrasound of his parotid glands like we discussed.

## 2020-04-16 ENCOUNTER — Other Ambulatory Visit: Payer: Self-pay | Admitting: Infectious Diseases

## 2020-04-16 DIAGNOSIS — I10 Essential (primary) hypertension: Secondary | ICD-10-CM

## 2020-05-12 ENCOUNTER — Ambulatory Visit: Payer: Medicare HMO | Admitting: Infectious Diseases

## 2020-05-26 ENCOUNTER — Other Ambulatory Visit: Payer: Self-pay | Admitting: Infectious Diseases

## 2020-05-26 DIAGNOSIS — I10 Essential (primary) hypertension: Secondary | ICD-10-CM

## 2020-05-26 DIAGNOSIS — B20 Human immunodeficiency virus [HIV] disease: Secondary | ICD-10-CM

## 2020-06-30 ENCOUNTER — Other Ambulatory Visit (HOSPITAL_COMMUNITY)
Admission: RE | Admit: 2020-06-30 | Discharge: 2020-06-30 | Disposition: A | Discharge status: Home / Self Care | Payer: Medicare HMO | Source: Ambulatory Visit | Attending: Infectious Diseases | Admitting: Infectious Diseases

## 2020-06-30 ENCOUNTER — Other Ambulatory Visit: Payer: Medicare HMO

## 2020-06-30 ENCOUNTER — Other Ambulatory Visit: Payer: Self-pay

## 2020-06-30 DIAGNOSIS — Z21 Asymptomatic human immunodeficiency virus [HIV] infection status: Secondary | ICD-10-CM

## 2020-06-30 DIAGNOSIS — Z113 Encounter for screening for infections with a predominantly sexual mode of transmission: Secondary | ICD-10-CM | POA: Diagnosis present

## 2020-06-30 NOTE — Addendum Note (Signed)
Addended by: Caffie Pinto on: 06/30/2020 03:25 PM   Modules accepted: Orders

## 2020-06-30 NOTE — Addendum Note (Signed)
Addended by: Caffie Pinto on: 06/30/2020 03:46 PM   Modules accepted: Orders

## 2020-07-01 ENCOUNTER — Telehealth: Payer: Self-pay

## 2020-07-01 ENCOUNTER — Ambulatory Visit: Payer: Medicare HMO

## 2020-07-01 LAB — URINE CYTOLOGY ANCILLARY ONLY
Chlamydia: NEGATIVE
Comment: NEGATIVE
Comment: NORMAL
Neisseria Gonorrhea: NEGATIVE

## 2020-07-01 LAB — T-HELPER CELL (CD4) - (RCID CLINIC ONLY)
CD4 % Helper T Cell: 35 % (ref 33–65)
CD4 T Cell Abs: 1413 /uL (ref 400–1790)

## 2020-07-01 NOTE — Progress Notes (Signed)
Please call Max Ray to let him know that his syphilis test indicates he has a new infection since the last he was checked.  If he has not had treatment since the last RPR in July 2021 he needs to get treated with 2.4 million units bicillin x 1

## 2020-07-01 NOTE — Telephone Encounter (Signed)
Called patient to relay results and schedule treatment, no answer. Left HIPAA compliant voicemail requesting callback.   Per health department, patient has not been treated since 2018.   Beryle Flock, RN

## 2020-07-01 NOTE — Telephone Encounter (Signed)
Patient returned call, notified him of positive syphilis results and need for treatment. Patient scheduled for treatment this afternoon.   Beryle Flock, RN

## 2020-07-01 NOTE — Telephone Encounter (Signed)
-----   Message from [place] Callas, NP sent at 07/01/2020  1:43 PM EDT ----- Please call Max Ray to let him know that his syphilis test indicates he has a new infection since the last he was checked.  If he has not had treatment since the last RPR in July 2021 he needs to get treated with 2.4 million units bicillin x 1

## 2020-07-02 ENCOUNTER — Other Ambulatory Visit: Payer: Self-pay

## 2020-07-02 ENCOUNTER — Ambulatory Visit: Payer: Medicare HMO

## 2020-07-02 DIAGNOSIS — A539 Syphilis, unspecified: Secondary | ICD-10-CM

## 2020-07-02 MED ORDER — PENICILLIN G BENZATHINE 1200000 UNIT/2ML IM SUSY: 2.4000 10*6.[IU] | PREFILLED_SYRINGE | Freq: Once | INTRAMUSCULAR | Status: DC

## 2020-07-02 NOTE — Progress Notes (Signed)
Patient in clinic today for syphilis treatment. Verified orders per Janene Madeira previous note on 07/01/20  for Bicillin 2.4 million units IM once.  Allergies verified and verbal consent received by patient.  Advised patient to abstain from any sexual contact 10 day post treatment. Condoms accepted.  Patient also advised that partners can be tested and treated with appointment at [facility]. Or local health department. Patient verbalized understanding. No concerns or questions voiced . Chaperone present.  Eugenia Mcalpine

## 2020-07-03 LAB — CBC WITH DIFFERENTIAL/PLATELET
Absolute Monocytes: 724 cells/uL (ref 200–950)
Basophils Absolute: 20 cells/uL (ref 0–200)
Basophils Relative: 0.2 %
Eosinophils Absolute: 122 cells/uL (ref 15–500)
Eosinophils Relative: 1.2 %
HCT: 34.8 % — ABNORMAL LOW (ref 38.5–50.0)
Hemoglobin: 11.3 g/dL — ABNORMAL LOW (ref 13.2–17.1)
Lymphs Abs: 4488 cells/uL — ABNORMAL HIGH (ref 850–3900)
MCH: 30.3 pg (ref 27.0–33.0)
MCHC: 32.5 g/dL (ref 32.0–36.0)
MCV: 93.3 fL (ref 80.0–100.0)
MPV: 9.2 fL (ref 7.5–12.5)
Monocytes Relative: 7.1 %
Neutro Abs: 4845 cells/uL (ref 1500–7800)
Neutrophils Relative %: 47.5 %
Platelets: 434 10*3/uL — ABNORMAL HIGH (ref 140–400)
RBC: 3.73 10*6/uL — ABNORMAL LOW (ref 4.20–5.80)
RDW: 13.3 % (ref 11.0–15.0)
Total Lymphocyte: 44 %
WBC: 10.2 10*3/uL (ref 3.8–10.8)

## 2020-07-03 LAB — COMPLETE METABOLIC PANEL WITH GFR
AG Ratio: 1.1 (calc) (ref 1.0–2.5)
ALT: 26 U/L (ref 9–46)
AST: 22 U/L (ref 10–35)
Albumin: 3.8 g/dL (ref 3.6–5.1)
Alkaline phosphatase (APISO): 95 U/L (ref 35–144)
BUN: 9 mg/dL (ref 7–25)
CO2: 29 mmol/L (ref 20–32)
Calcium: 9.3 mg/dL (ref 8.6–10.3)
Chloride: 102 mmol/L (ref 98–110)
Creat: 0.99 mg/dL (ref 0.70–1.33)
GFR, Est African American: 96 mL/min/{1.73_m2} (ref >=60)
GFR, Est Non African American: 83 mL/min/{1.73_m2} (ref >=60)
Globulin: 3.5 g/dL (calc) (ref 1.9–3.7)
Glucose, Bld: 73 mg/dL (ref 65–99)
Potassium: 4.4 mmol/L (ref 3.5–5.3)
Sodium: 137 mmol/L (ref 135–146)
Total Bilirubin: 0.4 mg/dL (ref 0.2–1.2)
Total Protein: 7.3 g/dL (ref 6.1–8.1)

## 2020-07-03 LAB — HIV-1 RNA QUANT-NO REFLEX-BLD
HIV 1 RNA Quant: 23 Copies/mL — ABNORMAL HIGH
HIV-1 RNA Quant, Log: 1.36 Log cps/mL — ABNORMAL HIGH

## 2020-07-03 LAB — FLUORESCENT TREPONEMAL AB(FTA)-IGG-BLD: Fluorescent Treponemal ABS: REACTIVE — AB

## 2020-07-03 LAB — RPR: RPR Ser Ql: REACTIVE — AB

## 2020-07-03 LAB — RPR TITER: RPR Titer: 1:32 {titer} — ABNORMAL HIGH

## 2020-07-05 ENCOUNTER — Other Ambulatory Visit: Payer: Self-pay | Admitting: Infectious Diseases

## 2020-07-05 DIAGNOSIS — B20 Human immunodeficiency virus [HIV] disease: Secondary | ICD-10-CM

## 2020-07-23 ENCOUNTER — Ambulatory Visit: Payer: Self-pay

## 2020-07-23 ENCOUNTER — Other Ambulatory Visit: Payer: Self-pay

## 2020-07-28 ENCOUNTER — Ambulatory Visit: Payer: Medicare HMO | Admitting: Infectious Diseases

## 2020-07-28 ENCOUNTER — Ambulatory Visit: Payer: Medicare HMO

## 2020-08-25 ENCOUNTER — Encounter: Payer: Self-pay | Admitting: Infectious Diseases

## 2020-08-28 ENCOUNTER — Other Ambulatory Visit: Payer: Self-pay | Admitting: Infectious Diseases

## 2020-08-28 ENCOUNTER — Telehealth: Payer: Self-pay

## 2020-08-28 DIAGNOSIS — I10 Essential (primary) hypertension: Secondary | ICD-10-CM

## 2020-08-28 NOTE — Telephone Encounter (Signed)
Please advise on refill.

## 2020-08-28 NOTE — Telephone Encounter (Signed)
Patient called office today stating he is down to his last four doses of Odefsey. Is still waiting on financial assistance to get approved. Spoke with [facility] who does not have coverage for medication yet.  Per financial counselor patient is approved for ADAP and should be able to get medication today. Provided patient with pharmacy contact information.  Leatrice Jewels, RMA

## 2020-09-03 ENCOUNTER — Ambulatory Visit (INDEPENDENT_AMBULATORY_CARE_PROVIDER_SITE_OTHER): Payer: Self-pay | Admitting: Infectious Diseases

## 2020-09-03 ENCOUNTER — Other Ambulatory Visit: Payer: Self-pay

## 2020-09-03 ENCOUNTER — Encounter: Payer: Self-pay | Admitting: Infectious Diseases

## 2020-09-03 DIAGNOSIS — Z8619 Personal history of other infectious and parasitic diseases: Secondary | ICD-10-CM

## 2020-09-03 DIAGNOSIS — B20 Human immunodeficiency virus [HIV] disease: Secondary | ICD-10-CM

## 2020-09-03 DIAGNOSIS — F4310 Post-traumatic stress disorder, unspecified: Secondary | ICD-10-CM

## 2020-09-03 DIAGNOSIS — I10 Essential (primary) hypertension: Secondary | ICD-10-CM

## 2020-09-03 MED ORDER — LOSARTAN POTASSIUM 50 MG PO TABS: ORAL_TABLET | ORAL | 5 refills | Status: DC

## 2020-09-03 MED ORDER — ODEFSEY 200-25-25 MG PO TABS: 1.0000 | ORAL_TABLET | Freq: Every day | ORAL | 6 refills | Status: DC

## 2020-09-03 NOTE — Progress Notes (Signed)
Name: Max Ray  DOB: [birth year] MRN: [id number] PCP: Sid Falcon, MD    Brief Narrative:  Max Ray is a 60 y.o. male with well controlled HIV.  CD4 nadir unknown  HIV Risk: MSM/Bisexual History of OIs: None known Intake Labs: 2013 Hep B sAg (-), sAb (+); Hep A (vax 2014), Hep C (-) Quantiferon (-) HLA B*5701 ([) G6PD: ()   Previous Regimens: Complera Odefsey >> undetectable   Genotypes: None on file   Subjective:   Chief Complaint  Patient presents with   Follow-up    B20 -       HPI: Admitted to [facility] for bowel obstruction May 15, 2020. Had 2 surgeries and stayed in the hospital for about a month. Lost 30# over that time frame, which he is hopeful to keep off. He spent a majority of the visit recounting hospitalization events today. A primary concern for [name] today is what blood presure medication he should be using. Taking metoprolol only once daily and ran out of the Cardizem. He wishes to go back on losartan once daily as he was previously on for years. No dizziness, headaches, palpitations, SOB or fatigue.   Reports no missed doses of odefsey - that continues to go well and he is getting in enough calories to have this absorb.   PTSD history with psychiatrics. His disability was cut off during his hospitalization - submitted an appeal recently. He is in need of behavioral support; he knows alcohol is not his answer but has had some drawing thoughts to resume it lately and feels that he has had resurgence of previous traumas recently.    Review of Systems  Constitutional:  Positive for weight loss. Negative for chills and fever.  HENT:  Negative for tinnitus.   Eyes:  Negative for blurred vision and photophobia.  Respiratory:  Negative for cough and sputum production.   Cardiovascular:  Negative for chest pain.  Gastrointestinal:  Negative for abdominal pain, diarrhea, nausea and vomiting.  Genitourinary:  Negative for dysuria.   Musculoskeletal:  Negative for back pain and joint pain.  Skin:  Negative for rash.  Neurological:  Negative for weakness and headaches.  Psychiatric/Behavioral:  Positive for depression. Negative for suicidal ideas. The patient is nervous/anxious and has insomnia.    Past Medical History:  Diagnosis Date   Anxiety    HIV disease ([place])    Hypertension    Pneumonia    Syphilis     Outpatient Medications Prior to Visit  Medication Sig Dispense Refill   metoprolol tartrate (LOPRESSOR) 100 MG tablet Take 100 mg by mouth 2 (two) times daily.     ODEFSEY 200-25-25 MG TABS tablet TAKE 1 TABLET BY MOUTH DAILY 30 tablet 0   ASHWAGANDHA PO Take by mouth. (Patient not taking: Reported on 09/03/2020)     fluticasone (FLONASE) 50 MCG/ACT nasal spray SHAKE LIQUID AND USE 1 SPRAY IN EACH NOSTRIL DAILY (Patient not taking: Reported on 09/03/2020) 16 g 11   Loratadine (CLARITIN PO) Take 10 mg by mouth daily. Equate brand (Patient not taking: Reported on 09/03/2020)     Magnesium 400 MG CAPS Take 1 capsule by mouth daily. (Patient not taking: Reported on 09/03/2020)     Misc Natural Products (IMMUNE TRIO PO) Take 1 tablet by mouth. (Patient not taking: Reported on 09/03/2020)     Misc Natural Products (OSTEO BI-FLEX ADV JOINT SHIELD) TABS Take 1 tablet by mouth. (Patient not taking: Reported on 09/03/2020)  Misc Natural Products (PROSTATE HEALTH) CAPS Take 1 capsule by mouth daily. (Patient not taking: Reported on 09/03/2020)     Multiple Vitamin (MULTIVITAMIN) tablet Take 1 tablet by mouth daily. Men +50 Centrum (Patient not taking: Reported on 09/03/2020)     TURMERIC PO Take by mouth. (Patient not taking: Reported on 09/03/2020)     amLODipine (NORVASC) 5 MG tablet Take 1 tablet (5 mg total) by mouth daily. 90 tablet 1   losartan (COZAAR) 50 MG tablet TAKE 1 TABLET(50 MG) BY MOUTH DAILY (Patient not taking: Reported on 09/03/2020) 30 tablet 0   Facility-Administered Medications Prior to Visit  Medication Dose Route  Frequency Provider Last Rate Last Admin   penicillin g benzathine (BICILLIN LA) 1200000 UNIT/2ML injection 2.4 Million Units  2.4 Million Units Intramuscular Once [place] Callas, NP         Allergies  Allergen Reactions   Sulfamethoxazole-Trimethoprim Other (See Comments)    Unknown allergic reaction   Sulfonamide Derivatives     Social History   Tobacco Use   Smoking status: Never   Smokeless tobacco: Never  Substance Use Topics   Alcohol use: Not Currently    Alcohol/week: 1.0 standard drink    Types: 1 Standard drinks or equivalent per week    Comment: social   Drug use: No     Social History   Substance and Sexual Activity  Sexual Activity Yes   Partners: Male   Birth control/protection: Condom   Comment: DECLINED CONDOMS     Objective:   Vitals:   09/03/20 1610  BP: 127/86  Pulse: 67  Temp: 98.6 F (37 C)  TempSrc: Oral  SpO2: 97%  Weight: 193 lb (87.5 kg)   Body mass index is 26.92 kg/m.  Physical Exam HENT:     Mouth/Throat:     Mouth: No oral lesions.     Dentition: Normal dentition. No dental caries.  Eyes:     General: No scleral icterus. Cardiovascular:     Rate and Rhythm: Normal rate and regular rhythm.     Heart sounds: Normal heart sounds.  Pulmonary:     Effort: Pulmonary effort is normal.     Breath sounds: Normal breath sounds.  Abdominal:     General: There is no distension.     Palpations: Abdomen is soft.     Tenderness: There is no abdominal tenderness.  Lymphadenopathy:     Cervical: No cervical adenopathy.  Skin:    General: Skin is warm and dry.     Findings: No rash.  Neurological:     Mental Status: He is alert and oriented to person, place, and time.    Lab Results Lab Results  Component Value Date   WBC 10.2 06/30/2020   HGB 11.3 (L) 06/30/2020   HCT 34.8 (L) 06/30/2020   MCV 93.3 06/30/2020   PLT 434 (H) 06/30/2020    Lab Results  Component Value Date   CREATININE 0.99 06/30/2020   BUN 9  06/30/2020   NA 137 06/30/2020   K 4.4 06/30/2020   CL 102 06/30/2020   CO2 29 06/30/2020    Lab Results  Component Value Date   ALT 26 06/30/2020   AST 22 06/30/2020   ALKPHOS 69 04/27/2016   BILITOT 0.4 06/30/2020    Lab Results  Component Value Date   CHOL 199 02/11/2019   HDL 64 02/11/2019   LDLCALC 94 02/11/2019   TRIG 286 (H) 02/11/2019   CHOLHDL 3.1 02/11/2019  HIV 1 RNA Quant (Copies/mL)  Date Value  06/30/2020 23 (H)  02/11/2020 <20  11/20/2019 <20   CD4 T Cell Abs (/uL)  Date Value  06/30/2020 1,413  11/20/2019 1,011  02/11/2019 1,286     Assessment & Plan:   Patient Active Problem List   Diagnosis Date Noted   Chronic pain of left thumb 02/11/2020   Prostate hypertrophy 04/27/2016   Abnormal CT scan, colon 03/25/2015   IBS (irritable bowel syndrome) 04/08/2014   Erectile dysfunction 03/15/2012   Renal insufficiency 06/22/2011   TIA (transient ischemic attack) 03/22/2011   HTN (hypertension) 03/22/2011   PTSD (post-traumatic stress disorder) 03/22/2011   Hypogonadism male 03/22/2011   Macrocytic anemia 03/22/2011   Anxiety and depression 03/22/2011   History of syphilis 03/09/2007   GLUCOSE-6-PHOSPHATE DEHYDROGENASE DEFICIENCY 03/09/2007   Allergic sinusitis 01/24/2007   PRESBYOPIA 06/19/2006   Human immunodeficiency virus (HIV) disease ([place]) 10/04/2005   GENITAL HERPES 10/04/2005     Problem List Items Addressed This Visit       Unprioritized   Human immunodeficiency virus (HIV) disease ([place]) (Chronic)    Doing well on Odefsey once daily and taking correctly with food. No PPIs or acid reducers. VL in late June undetectable and CD4 continues to be in healthy range > 500 cells.  Vaccines up to date - recommended flu vaccine and COVID bivalent booster once available (hopefully we have access to these in a few weeks). He would like to get them here. Will send in refills of odefsey for Mohammad.   Return in about 6 weeks (around 10/15/2020).        Relevant Medications   emtricitabine-rilpivir-tenofovir AF (ODEFSEY) 200-25-25 MG TABS tablet   PTSD (post-traumatic stress disorder)    Seems to have resurgence of previous traumatic thoughts and associated depression / anxiety. Unable to see previous mental health provider as he is uninsured. I will have him get set up with Marcie Bal to start counseling here, for which he was so appreciative of.       HTN (hypertension)    Reviewed previous hospitalization records and seems he had uncontrolled HTN on previous regimen. Today his BP looks quite good. Has been out of Cardizem and metoprolol only taking 1x daily. Will switch back to losartan at previous dose and have him back in 6 weeks to repeat BMP and BP check for him. He does not have a PCP and only wants to come here for care, especially with no insurance now.       Relevant Medications   losartan (COZAAR) 50 MG tablet   History of syphilis    Recently treated for reactive RPR and increased titer in June 2022 with 1 round of bicillin for early latent syphilis. Will repeat in 3-4 months to follow for therapeutic response.  Safe sex discussed, however he left prior to being able to get further STI screening done. Will offer 3-point GC/C testing at return visit in 6 weeks.       Other Visit Diagnoses     Essential hypertension       Relevant Medications   losartan (COZAAR) 50 MG tablet      Janene Madeira, MSN, NP-C [facility] for [facility] [place] Pager: [phone] Office: [phone]  09/08/20  1:13 PM

## 2020-09-03 NOTE — Patient Instructions (Addendum)
Stop the metoprolol and cardiazem. Lets put you back on your losartan once a day.  Come back to see me in 6 weeks to repeat your blood pressure and repeat your blood work.   Dr. Brown Human = call for your records  Address: [address], [place], [place] [address] Phone: [phone]  Please schedule a visit with Marcie Bal our counselor - she is here every Tuesday and Thursday.

## 2020-09-08 NOTE — Assessment & Plan Note (Signed)
Doing well on Odefsey once daily and taking correctly with food. No PPIs or acid reducers. VL in late June undetectable and CD4 continues to be in healthy range > 500 cells.  Vaccines up to date - recommended flu vaccine and COVID bivalent booster once available (hopefully we have access to these in a few weeks). He would like to get them here. Will send in refills of odefsey for Max Ray.   Return in about 6 weeks (around 10/15/2020).

## 2020-09-08 NOTE — Assessment & Plan Note (Signed)
Seems to have resurgence of previous traumatic thoughts and associated depression / anxiety. Unable to see previous mental health provider as he is uninsured. I will have him get set up with Marcie Bal to start counseling here, for which he was so appreciative of.

## 2020-09-08 NOTE — Assessment & Plan Note (Signed)
Recently treated for reactive RPR and increased titer in June 2022 with 1 round of bicillin for early latent syphilis. Will repeat in 3-4 months to follow for therapeutic response.  Safe sex discussed, however he left prior to being able to get further STI screening done. Will offer 3-point GC/C testing at return visit in 6 weeks.

## 2020-09-08 NOTE — Assessment & Plan Note (Signed)
Reviewed previous hospitalization records and seems he had uncontrolled HTN on previous regimen. Today his BP looks quite good. Has been out of Cardizem and metoprolol only taking 1x daily. Will switch back to losartan at previous dose and have him back in 6 weeks to repeat BMP and BP check for him. He does not have a PCP and only wants to come here for care, especially with no insurance now.

## 2020-09-09 ENCOUNTER — Telehealth: Payer: Self-pay

## 2020-09-09 DIAGNOSIS — I10 Essential (primary) hypertension: Secondary | ICD-10-CM

## 2020-09-09 MED ORDER — LOSARTAN POTASSIUM 50 MG PO TABS: ORAL_TABLET | ORAL | 5 refills | Status: DC

## 2020-09-09 NOTE — Telephone Encounter (Signed)
Patient left voicemail stating he was unable to get his two medications from [facility]. RN called pharmacy, they have [place] on file, but need a new prescription for losartan. RN resent losartan prescription. [facility] to call patient to set up delivery.   Beryle Flock, RN

## 2020-09-11 ENCOUNTER — Other Ambulatory Visit: Payer: Self-pay

## 2020-09-11 ENCOUNTER — Ambulatory Visit (INDEPENDENT_AMBULATORY_CARE_PROVIDER_SITE_OTHER): Payer: Self-pay

## 2020-09-11 ENCOUNTER — Telehealth: Payer: Self-pay | Admitting: *Deleted

## 2020-09-11 DIAGNOSIS — I10 Essential (primary) hypertension: Secondary | ICD-10-CM

## 2020-09-11 DIAGNOSIS — Z23 Encounter for immunization: Secondary | ICD-10-CM

## 2020-09-11 MED ORDER — VALSARTAN 80 MG PO TABS: 80.0000 mg | ORAL_TABLET | Freq: Every day | ORAL | 1 refills | Status: DC

## 2020-09-11 NOTE — Progress Notes (Signed)
    [facility]  Name:  JODI HUGHART    MRN: [id number] DOB: [birth year]   09/11/2020  Mr. Roosevelt was observed post JYNNEOS immunization for 15 minutes without incident. He was provided with Vaccine Information Sheet and instruction to access the V-Safe system.   Mr. Gerleman was instructed to call 911 with any severe reactions post vaccine: Difficulty breathing  Swelling of face and throat  A fast heartbeat  A bad rash all over body  Dizziness and weakness

## 2020-09-11 NOTE — Telephone Encounter (Signed)
Per [facility], Losartan is not covered on ADAP formulary. Formulary available at [email or web address]  Pharmacist is recommending alternatives of benicar or diovan, which are on formulary. Will send to prescriber and to PCP for advice. Patient uses [facility], community in [place], [place]. Landis Gandy, RN

## 2020-09-11 NOTE — Addendum Note (Signed)
Addended by: Landis Gandy on: 09/11/2020 03:47 PM   Modules accepted: Orders

## 2020-09-11 NOTE — Telephone Encounter (Signed)
RN sent Diovan 80 mg one tablet daily #30 with 1 refill to [facility] per Colletta [place], notified patient. He is scheduled to follow up next month with Colletta [place] to see how the new medication is working. Landis Gandy, RN

## 2020-09-15 ENCOUNTER — Other Ambulatory Visit: Payer: Self-pay

## 2020-09-15 ENCOUNTER — Ambulatory Visit: Payer: Self-pay

## 2020-09-22 ENCOUNTER — Other Ambulatory Visit: Payer: Self-pay

## 2020-09-22 ENCOUNTER — Ambulatory Visit: Payer: Self-pay

## 2020-09-29 ENCOUNTER — Ambulatory Visit: Payer: Self-pay

## 2020-09-29 ENCOUNTER — Other Ambulatory Visit: Payer: Self-pay

## 2020-10-06 ENCOUNTER — Ambulatory Visit: Payer: Self-pay

## 2020-10-06 ENCOUNTER — Other Ambulatory Visit: Payer: Self-pay

## 2020-10-13 ENCOUNTER — Ambulatory Visit: Payer: Self-pay

## 2020-10-13 ENCOUNTER — Other Ambulatory Visit: Payer: Self-pay

## 2020-10-14 ENCOUNTER — Telehealth (HOSPITAL_COMMUNITY): Payer: Self-pay | Admitting: Psychiatry

## 2020-10-19 ENCOUNTER — Other Ambulatory Visit: Payer: Self-pay

## 2020-10-19 ENCOUNTER — Ambulatory Visit (INDEPENDENT_AMBULATORY_CARE_PROVIDER_SITE_OTHER): Payer: Self-pay | Admitting: Infectious Diseases

## 2020-10-19 VITALS — BP 129/86 | HR 99 | Resp 16 | Ht 71.0 in | Wt 200.0 lb

## 2020-10-19 DIAGNOSIS — I10 Essential (primary) hypertension: Secondary | ICD-10-CM

## 2020-10-19 DIAGNOSIS — F32A Anxiety disorder, unspecified: Secondary | ICD-10-CM

## 2020-10-19 DIAGNOSIS — Z21 Asymptomatic human immunodeficiency virus [HIV] infection status: Secondary | ICD-10-CM

## 2020-10-19 DIAGNOSIS — N529 Male erectile dysfunction, unspecified: Secondary | ICD-10-CM

## 2020-10-19 DIAGNOSIS — F419 Anxiety disorder, unspecified: Secondary | ICD-10-CM

## 2020-10-19 DIAGNOSIS — B20 Human immunodeficiency virus [HIV] disease: Secondary | ICD-10-CM

## 2020-10-19 DIAGNOSIS — Z23 Encounter for immunization: Secondary | ICD-10-CM

## 2020-10-19 DIAGNOSIS — Z8619 Personal history of other infectious and parasitic diseases: Secondary | ICD-10-CM

## 2020-10-19 DIAGNOSIS — Z113 Encounter for screening for infections with a predominantly sexual mode of transmission: Secondary | ICD-10-CM

## 2020-10-19 MED ORDER — VALSARTAN 80 MG PO TABS: 80.0000 mg | ORAL_TABLET | Freq: Every day | ORAL | 5 refills | Status: DC

## 2020-10-19 MED ORDER — SILDENAFIL CITRATE 100 MG PO TABS: 100.0000 mg | ORAL_TABLET | Freq: Every day | ORAL | 2 refills | Status: DC | PRN

## 2020-10-19 NOTE — Progress Notes (Signed)
Name: Max Ray  DOB: 05-13-1960 MRN: [id number] PCP: Sid Falcon, MD    Brief Narrative:  Max Ray is a 60 y.o. male with well controlled HIV.  CD4 nadir unknown  HIV Risk: MSM/Bisexual History of OIs: None known Intake Labs: 2013 Hep B sAg (-), sAb (+); Hep A (vax 2014), Hep C (-) Quantiferon (-) HLA B*5701 ([) G6PD: ()   Previous Regimens: Complera Odefsey >> undetectable   Genotypes: None on file   Subjective:   Chief Complaint  Patient presents with   Follow-up      HPI: Here today for follow up after changing blood pressure medications following loss of insurance and significant hospitalization. States he feels that he has had the most consistent BP in a long time and likes the medication. No side effects. Takes it everyday.   Reports no missed doses of odefsey - that continues to go well and he is getting in enough calories to have this absorb. He wants to review last set of labs to make sure all looks good still. He was so worried about mental health priorities last time he does not recall much of the conversation.   Working with counseling services here at the clinic while trying to get in to see Dr. Casimiro Needle, whom he worked with in the past. He is so appreciative of counseling and feels empowered with over his mental health. Much more peaceful.  Received mpx vaccine x 1st dose 9/09. Requesting flu shot today.   Wants to go over syphilis labs from last check also. No symptoms at that time. Had 1 partner within 6 months of labs.    Review of Systems  Constitutional:  Negative for chills, fever and weight loss.  HENT:  Negative for tinnitus.   Eyes:  Negative for blurred vision and photophobia.  Respiratory:  Negative for cough and sputum production.   Cardiovascular:  Negative for chest pain.  Gastrointestinal:  Negative for abdominal pain, diarrhea, nausea and vomiting.  Genitourinary:  Negative for dysuria.  Musculoskeletal:  Negative for  back pain and joint pain.  Skin:  Negative for rash.  Neurological:  Negative for weakness and headaches.  Psychiatric/Behavioral:  Negative for depression and suicidal ideas. The patient has insomnia. The patient is not nervous/anxious.    Past Medical History:  Diagnosis Date   Anxiety    HIV disease ([place])    Hypertension    Pneumonia    Syphilis     Outpatient Medications Prior to Visit  Medication Sig Dispense Refill   ASHWAGANDHA PO Take by mouth.     emtricitabine-rilpivir-tenofovir AF (ODEFSEY) 200-25-25 MG TABS tablet Take 1 tablet by mouth daily. 30 tablet 6   fluticasone (FLONASE) 50 MCG/ACT nasal spray SHAKE LIQUID AND USE 1 SPRAY IN EACH NOSTRIL DAILY 16 g 11   Loratadine (CLARITIN PO) Take 10 mg by mouth daily. Equate brand     Magnesium 400 MG CAPS Take 1 capsule by mouth daily.     Misc Natural Products (IMMUNE TRIO PO) Take 1 tablet by mouth.     Misc Natural Products (OSTEO BI-FLEX ADV JOINT SHIELD) TABS Take 1 tablet by mouth.     Misc Natural Products (PROSTATE HEALTH) CAPS Take 1 capsule by mouth daily.     Multiple Vitamin (MULTIVITAMIN) tablet Take 1 tablet by mouth daily. Men +50 Centrum     TURMERIC PO Take by mouth.     valsartan (DIOVAN) 80 MG tablet Take 1 tablet (80 mg total)  by mouth daily. 30 tablet 1   Facility-Administered Medications Prior to Visit  Medication Dose Route Frequency Provider Last Rate Last Admin   penicillin g benzathine (BICILLIN LA) 1200000 UNIT/2ML injection 2.4 Million Units  2.4 Million Units Intramuscular Once [place] Callas, NP         Allergies  Allergen Reactions   Sulfamethoxazole-Trimethoprim Other (See Comments)    Unknown allergic reaction   Sulfonamide Derivatives     Social History   Tobacco Use   Smoking status: Never   Smokeless tobacco: Never  Substance Use Topics   Alcohol use: Not Currently    Alcohol/week: 1.0 standard drink    Types: 1 Standard drinks or equivalent per week    Comment: social    Drug use: No     Social History   Substance and Sexual Activity  Sexual Activity Yes   Partners: Male   Birth control/protection: Condom   Comment: DECLINED CONDOMS     Objective:   Vitals:   10/19/20 1142  BP: 129/86  Pulse: 99  Resp: 16  SpO2: 95%  Weight: 200 lb (90.7 kg)  Height: 5' 11"  (1.803 m)   Body mass index is 27.89 kg/m.  Physical Exam HENT:     Mouth/Throat:     Mouth: No oral lesions.     Dentition: Normal dentition. No dental caries.  Eyes:     General: No scleral icterus. Cardiovascular:     Rate and Rhythm: Normal rate and regular rhythm.     Heart sounds: Normal heart sounds.  Pulmonary:     Effort: Pulmonary effort is normal.     Breath sounds: Normal breath sounds.  Abdominal:     General: There is no distension.     Palpations: Abdomen is soft.     Tenderness: There is no abdominal tenderness.  Lymphadenopathy:     Cervical: No cervical adenopathy.  Skin:    General: Skin is warm and dry.     Findings: No rash.  Neurological:     Mental Status: He is alert and oriented to person, place, and time.    Lab Results Lab Results  Component Value Date   WBC 10.2 06/30/2020   HGB 11.3 (L) 06/30/2020   HCT 34.8 (L) 06/30/2020   MCV 93.3 06/30/2020   PLT 434 (H) 06/30/2020    Lab Results  Component Value Date   CREATININE 0.99 06/30/2020   BUN 9 06/30/2020   NA 137 06/30/2020   K 4.4 06/30/2020   CL 102 06/30/2020   CO2 29 06/30/2020    Lab Results  Component Value Date   ALT 26 06/30/2020   AST 22 06/30/2020   ALKPHOS 69 04/27/2016   BILITOT 0.4 06/30/2020    Lab Results  Component Value Date   CHOL 199 02/11/2019   HDL 64 02/11/2019   LDLCALC 94 02/11/2019   TRIG 286 (H) 02/11/2019   CHOLHDL 3.1 02/11/2019   HIV 1 RNA Quant (Copies/mL)  Date Value  06/30/2020 23 (H)  02/11/2020 <20  11/20/2019 <20   CD4 T Cell Abs (/uL)  Date Value  06/30/2020 1,413  11/20/2019 1,011  02/11/2019 1,286     Assessment &  Plan:   Patient Active Problem List   Diagnosis Date Noted   Chronic pain of left thumb 02/11/2020   Prostate hypertrophy 04/27/2016   IBS (irritable bowel syndrome) 04/08/2014   Erectile dysfunction 03/15/2012   Renal insufficiency 06/22/2011   TIA (transient ischemic attack) 03/22/2011  Hypertension 03/22/2011   PTSD (post-traumatic stress disorder) 03/22/2011   Hypogonadism male 03/22/2011   Macrocytic anemia 03/22/2011   Anxiety and depression 03/22/2011   History of syphilis 03/09/2007   GLUCOSE-6-PHOSPHATE DEHYDROGENASE DEFICIENCY 03/09/2007   Allergic sinusitis 01/24/2007   PRESBYOPIA 06/19/2006   Human immunodeficiency virus (HIV) disease ([place]) 10/04/2005   GENITAL HERPES 10/04/2005     Problem List Items Addressed This Visit       Unprioritized   Human immunodeficiency virus (HIV) disease ([place]) (Chronic)    Well controlled with fully recovered CD4 > 1400 last check. Check VL today for reassurance and ensure he still is doing well on Odefsey after 4 week hold from his GI surgery.  STI screening as outlined  Flu shot and mpx #2 dose today. Counseled on s/e of injection site r/t mpx vaccine.       Hypertension    Well controlled on diovan. Will continue this for him - refills sent in. RTC in 32mto follow up.       Relevant Medications   sildenafil (VIAGRA) 100 MG tablet   valsartan (DIOVAN) 80 MG tablet   History of syphilis    Reviewed labs and and answered questions. Repeat titer today as it has been nearly 4 months since treatment. Will screen with other STIs today as well for verse partner.       Relevant Orders   RPR   Erectile dysfunction    Sildenafil 100 mg given today with Goodrx discount card. This dose has been effective for him in the past. Counseled over s/e.       Relevant Medications   sildenafil (VIAGRA) 100 MG tablet   Anxiety and depression    Improved - he will continue with counseling as discussed today.       Other Visit Diagnoses      Asymptomatic HIV infection, with no history of HIV-related illness ([place]    -  Primary   Relevant Orders   HIV-1 RNA quant-no reflex-bld   Routine screening for STI (sexually transmitted infection)       Relevant Orders   Urine cytology ancillary only   Cytology (oral, anal, urethral) ancillary only   Cytology (oral, anal, urethral) ancillary only   Essential hypertension       Relevant Medications   sildenafil (VIAGRA) 100 MG tablet   valsartan (DIOVAN) 80 MG tablet       SJanene Madeira MSN, NP-C RMethod[facility] Hospitalfor Infectious DFowlerPager: [phone]: [phone] 10/19/20  12:28 PM

## 2020-10-19 NOTE — Assessment & Plan Note (Signed)
Improved - he will continue with counseling as discussed today.

## 2020-10-19 NOTE — Assessment & Plan Note (Signed)
Well controlled on diovan. Will continue this for him - refills sent in. RTC in 73m to follow up.

## 2020-10-19 NOTE — Assessment & Plan Note (Signed)
Well controlled with fully recovered CD4 > 1400 last check. Check VL today for reassurance and ensure he still is doing well on Odefsey after 4 week hold from his GI surgery.  STI screening as outlined  Flu shot and mpx #2 dose today. Counseled on s/e of injection site r/t mpx vaccine.

## 2020-10-19 NOTE — Assessment & Plan Note (Signed)
Sildenafil 100 mg given today with Goodrx discount card. This dose has been effective for him in the past. Counseled over s/e.

## 2020-10-19 NOTE — Assessment & Plan Note (Signed)
Reviewed labs and and answered questions. Repeat titer today as it has been nearly 4 months since treatment. Will screen with other STIs today as well for verse partner.

## 2020-10-19 NOTE — Patient Instructions (Addendum)
Your blood pressure looks great today - please continue the blood pressure pill (Diovan) once a day. I sent in refills of this to your mail order pharmacy since it is working so well.   Please continue your Odefsey once a day with food   Pick up the Viagra at the [facility] - give them the coupon so it can be cheaper price for you.   Please come back to see me again for routine care in 4 months so we can check your blood pressure again.   Please stop by the lab on your way out.

## 2020-10-20 ENCOUNTER — Ambulatory Visit: Payer: Self-pay

## 2020-10-20 LAB — URINE CYTOLOGY ANCILLARY ONLY
Chlamydia: NEGATIVE
Comment: NEGATIVE
Comment: NORMAL
Neisseria Gonorrhea: NEGATIVE

## 2020-10-20 LAB — CYTOLOGY, (ORAL, ANAL, URETHRAL) ANCILLARY ONLY
Chlamydia: NEGATIVE
Chlamydia: POSITIVE — AB
Comment: NEGATIVE
Comment: NEGATIVE
Comment: NORMAL
Comment: NORMAL
Neisseria Gonorrhea: NEGATIVE
Neisseria Gonorrhea: NEGATIVE

## 2020-10-21 ENCOUNTER — Telehealth: Payer: Self-pay

## 2020-10-21 DIAGNOSIS — A749 Chlamydial infection, unspecified: Secondary | ICD-10-CM

## 2020-10-21 LAB — RPR TITER: RPR Titer: 1:16 {titer} — ABNORMAL HIGH

## 2020-10-21 LAB — FLUORESCENT TREPONEMAL AB(FTA)-IGG-BLD: Fluorescent Treponemal ABS: REACTIVE — AB

## 2020-10-21 LAB — HIV-1 RNA QUANT-NO REFLEX-BLD
HIV 1 RNA Quant: NOT DETECTED Copies/mL
HIV-1 RNA Quant, Log: NOT DETECTED Log cps/mL

## 2020-10-21 LAB — RPR: RPR Ser Ql: REACTIVE — AB

## 2020-10-21 MED ORDER — DOXYCYCLINE HYCLATE 100 MG PO TABS: 100.0000 mg | ORAL_TABLET | Freq: Two times a day (BID) | ORAL | 0 refills | Status: DC

## 2020-10-21 NOTE — Telephone Encounter (Signed)
Called patient to relay lab results. Patient understands that he tested positive for Chlamydia. Is aware that doxycyline 100 mg tablets will be sent to his pharmacy to pickup. Does not have any questions regarding prescription.  Patient will update recent partners to get [facility]  Called [facility] to cancel previous RX for Doxy #20 tabs. New prescription sent to pharmacy. Leatrice Jewels, RMA

## 2020-10-21 NOTE — Telephone Encounter (Signed)
-----   Message from [place] Callas, NP sent at 10/21/2020  8:58 AM EDT ----- Please call Max Ray to let him know his viral load is undetectable - continue odefsey as he has been   Syphilis tests are coming down, which is exactly what I want to see. Will check these again at his next visit. No more treatment needed and he is not infectious.   One of his swabs returned positive for chlamydia - will need to treat this with doxycycline x 7 days. Please send doxycycline [facility] to [facility] for me #14, 0 refills, one tab BID with food to help with nausea

## 2020-10-23 ENCOUNTER — Other Ambulatory Visit: Payer: Self-pay

## 2020-10-23 ENCOUNTER — Telehealth: Payer: Self-pay

## 2020-10-23 ENCOUNTER — Ambulatory Visit: Payer: Self-pay

## 2020-10-23 DIAGNOSIS — A749 Chlamydial infection, unspecified: Secondary | ICD-10-CM

## 2020-10-23 MED ORDER — DOXYCYCLINE HYCLATE 100 MG PO TABS: 100.0000 mg | ORAL_TABLET | Freq: Two times a day (BID) | ORAL | 0 refills | Status: AC

## 2020-10-23 NOTE — Telephone Encounter (Signed)
Patient called stating he had an OOP cost of $52 for his doxycycline prescription. Patient previously requested medication be sent to [facility] in [facility] and his viagra be sent to [place]. Colletta [place], NP provided patient with [facility] card for the Viagra and patient wants to see if his doxy can be sent to the same pharmacy. RN provided patient with a GoodRx for Doxy at [facility]. Information is below. Patient instructed to contact our office if he has further issues.   Carlean Purl, [place]  [id number] PCN  [facility]  [phone] Member ID  [id number]

## 2020-10-27 ENCOUNTER — Other Ambulatory Visit: Payer: Self-pay

## 2020-10-27 ENCOUNTER — Ambulatory Visit: Payer: Self-pay

## 2020-11-03 ENCOUNTER — Ambulatory Visit: Payer: Self-pay

## 2020-11-03 ENCOUNTER — Other Ambulatory Visit: Payer: Self-pay

## 2020-11-03 NOTE — Progress Notes (Unsigned)
Patient ID: Max Ray, male   DOB: 03-11-1960, 60 y.o.   MRN: [id number] Therapist met with client as scheduled and discussed treatment plan/case management progress. Today's clinical contact focused on building the level of trust with the client through consistent eye contact, active listening, unconditional positive regard, and warm acceptance. Therapist used active listening skills to provide client with opportunity to disclose previous challenges and successes. Client was given the opportunity to share what he/she recalls about the traumatic event. A discussion was held about how PTSD results from exposure to trauma and results in intrusive recollections, unwarranted fears, anxiety, and a vulnerability to other negative emotions. Therapist assessed for SI/HI during session and will follow-up with client during the next session.   Client met with therapist as scheduled. Client presented alert mood and appeared to euthymic; affect was congruent with client report of mood. Client continues to struggle making/was able to make connections between consequences, challenges and alternative thoughts of mental health recovery during this session. Client continues to express their needs in the development of treatment goals and described different communities of which can serve an added support for them. Client expressed concerns/problems in the following areas of their life regarding his PTSD and anxiety. Client continues to describe experiencing intrusive, distressing thoughts or images that recall the traumatic event and its associated intense emotional response. He describes having experienced exaggerated startle responses in the past. Client has made progress with rumination and emotional flashbacks. In previous sessions client presented as is his traumatic experience had just occurred. However, in resent sessions client continues to be vocal but in a less exaggerated format. Progress toward therapeutic goal(s)  continues to remain minimal. Client denied SI/HI.

## 2020-11-03 NOTE — Progress Notes (Unsigned)
Patient ID: Max Ray, male   DOB: 1960/03/17, 60 y.o.   MRN: [id number]

## 2020-11-10 ENCOUNTER — Ambulatory Visit: Payer: Self-pay

## 2020-11-16 ENCOUNTER — Ambulatory Visit: Payer: Self-pay

## 2020-11-17 ENCOUNTER — Other Ambulatory Visit: Payer: Self-pay

## 2020-11-17 ENCOUNTER — Ambulatory Visit (INDEPENDENT_AMBULATORY_CARE_PROVIDER_SITE_OTHER): Payer: Self-pay

## 2020-11-17 ENCOUNTER — Ambulatory Visit: Payer: Self-pay

## 2020-11-17 DIAGNOSIS — Z23 Encounter for immunization: Secondary | ICD-10-CM

## 2020-11-17 NOTE — Progress Notes (Signed)
   Covid-19 Vaccination Clinic  Name:  Max Ray    MRN: [id number] DOB: 09-17-1960  11/17/2020  [name] was observed post Covid-19 immunization for 15 minutes without incident. He was provided with Vaccine Information Sheet and instruction to access the V-Safe system.   [name] was instructed to call 911 with any severe reactions post vaccine: Difficulty breathing  Swelling of face and throat  A fast heartbeat  A bad rash all over body  Dizziness and weakness   Immunizations Administered     Name Date Dose VIS Date Route   Pfizer Covid-19 Vaccine Bivalent Booster 11/17/2020 10:45 AM 0.3 mL 09/02/2020 Intramuscular   Manufacturer: [place]   Lot: [id number]   [place]: [place], RN

## 2020-11-19 NOTE — Progress Notes (Unsigned)
Patient ID: Max Ray, male   DOB: Mar 03, 1960, 60 y.o.   MRN: [id number]  Therapist met with client as scheduled and discussed treatment plan/case management progress. Today's clinical contact focused on building the level of trust with the client through consistent eye contact, active listening, unconditional positive regard, and warm acceptance. Therapist used active listening skills to provide client with opportunity to disclose previous challenges and successes. Client was given the opportunity to share what he/she recalls about the traumatic event. A discussion was held about how PTSD results from exposure to trauma and results in intrusive recollections, unwarranted fears, anxiety, and a vulnerability to other negative emotions. Therapist assessed for SI/HI during session and will follow-up with client during the next session.   Client met with therapist as scheduled. Client presented alert mood and appeared to euthymic; affect was congruent with client report of mood. Client continues to struggle making/was able to make connections between consequences, challenges and alternative thoughts of mental health recovery during this session. Client continues to express their needs in the development of treatment goals and described different communities of which can serve an added support for them. Client continues to describe experiencing intrusive, distressing thoughts or images that recall the traumatic event and its associated intense emotional response. Client has made progress with rumination and emotional flashbacks. In previous sessions client presented as is his traumatic experience had just occurred. 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 However, in recent sessions client continues to be vocal but in a less exaggerated format. Client expressed that therapy has done a lot of god and healing for him and intends to continue that process. Progress  toward therapeutic goal(s) continues to remain minimal. Client denied SI/HI.

## 2020-11-24 ENCOUNTER — Ambulatory Visit: Payer: Self-pay

## 2020-11-24 NOTE — Progress Notes (Deleted)
NCNS

## 2020-12-01 ENCOUNTER — Ambulatory Visit: Payer: Self-pay

## 2020-12-01 ENCOUNTER — Other Ambulatory Visit: Payer: Self-pay

## 2020-12-08 ENCOUNTER — Ambulatory Visit: Payer: Self-pay

## 2020-12-08 ENCOUNTER — Other Ambulatory Visit: Payer: Self-pay

## 2020-12-15 ENCOUNTER — Ambulatory Visit: Payer: Self-pay

## 2020-12-15 ENCOUNTER — Telehealth: Payer: Self-pay

## 2020-12-15 ENCOUNTER — Other Ambulatory Visit: Payer: Self-pay

## 2020-12-15 NOTE — Progress Notes (Unsigned)
Patient ID: Max Ray, male   DOB: Apr 05, 1960, 60 y.o.   MRN: [id number] Therapist met with client as scheduled and discussed treatment plan/case management progress. Today's clinical contact focused on building the level of trust with the client through consistent eye contact, active listening, unconditional positive regard, and warm acceptance. Therapist used active listening skills to provide client with opportunity to disclose previous challenges and successes. Client was given the opportunity to share what he/she recalls about the traumatic event. A discussion was held about how PTSD results from exposure to trauma and results in intrusive recollections, unwarranted fears, anxiety, and a vulnerability to other negative emotions. Therapist assessed for SI/HI during session and will follow-up with client during the next session.   Client met with therapist as scheduled. Client presented alert mood and appeared to euthymic; affect was congruent with client report of mood. Client continues to struggle making/was able to make connections between consequences, challenges and alternative thoughts of mental health recovery during this session. Client continues to express their needs in the development of treatment goals and described different communities of which can serve an added support for them. Client continues to is making some progress with his therapy and allowing sublimation through secondary projects.. Client has made progress with rumination and emotional flashbacks. In previous sessions client presented as is his traumatic experience had just occurred; however, his presentation is more calm when describing events. 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 However, in recent sessions client continues to be vocal but in a less exaggerated format. Client expressed that therapy has done a lot of god and healing for him and intends to continue that  process. Progress toward therapeutic goal(s) continues to remain minimal. Client denied SI/HI.

## 2020-12-15 NOTE — Progress Notes (Unsigned)
Patient ID: Max Ray, male   DOB: 1960-06-10, 60 y.o.   MRN: [id number] Therapist met with client as scheduled and discussed treatment plan/case management progress. Today's clinical contact focused on building the level of trust with the client through consistent eye contact, active listening, unconditional positive regard, and warm acceptance. Therapist used active listening skills to provide client with opportunity to disclose previous challenges and successes. Client was given the opportunity to share what he/she recalls about the traumatic event. A discussion was held about how PTSD results from exposure to trauma and results in intrusive recollections, unwarranted fears, anxiety, and a vulnerability to other negative emotions. Therapist assessed for SI/HI during session and will follow-up with client during the next session.   Client met with therapist as scheduled. Client presented alert mood and appeared to euthymic; affect was congruent with client report of mood. Client continues to struggle making/was able to make connections between consequences, challenges and alternative thoughts of mental health recovery during this session. Client continues to express their needs in the development of treatment goals and described different communities of which can serve an added support for them. Client continues to with his mental health. Client is always well dressed which clinician complimented. 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 Client expressed that therapy has done a lot of god and healing for him and intends to continue that process. Progress toward therapeutic goal(s) continues to remain minimal. Client denied SI/HI.

## 2020-12-15 NOTE — Telephone Encounter (Signed)
Patient requested to speak to [facility] nurse regarding a refill he needed for medication. When I spoke with patient he states that he needed refills on his Sildenafil. Per chart patient should still have refills at [facility].  Requested patient to call pharmacy today to refill medicine. Will call with any questions/ concerns. Leatrice Jewels, RMA

## 2020-12-22 ENCOUNTER — Ambulatory Visit: Payer: Self-pay

## 2020-12-22 ENCOUNTER — Other Ambulatory Visit: Payer: Self-pay

## 2020-12-22 NOTE — Progress Notes (Unsigned)
Patient ID: Max Ray, male   DOB: 1960/07/17, 60 y.o.   MRN: [id number] Therapist met with client as scheduled and discussed treatment plan/case management progress. Today's clinical contact focused on building the level of trust with the client through consistent eye contact, active listening, unconditional positive regard, and warm acceptance. Therapist used active listening skills to provide client with opportunity to disclose previous challenges and successes. Client was given the opportunity to share what he/she recalls about the traumatic event. A discussion was held about how PTSD results from exposure to trauma and results in intrusive recollections, unwarranted fears, anxiety, and a vulnerability to other negative emotions. Therapist assessed for SI/HI during session and will follow-up with client during the next session.   Client met with therapist as scheduled. Client presented alert mood and appeared to euthymic; affect was congruent with client report of mood. Client was able to make connections between consequences, challenges and alternative thoughts of mental health recovery during this session. Client continues to express their needs in the development of treatment goals and described different communities of which can serve an added support for them. 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 Client has expressed different communities that can serve as an added support for his goals. Client expressed that therapy has done a lot of god and healing for him and intends to continue that process. Client continues to talk excessively during session; however was able to process the various steps to treatment along with talk therapy. Progress toward therapeutic goal(s) continues to remain minimal. Client denied  SI/HI.

## 2021-01-05 ENCOUNTER — Ambulatory Visit: Payer: Self-pay

## 2021-01-05 ENCOUNTER — Other Ambulatory Visit: Payer: Self-pay

## 2021-01-07 NOTE — Progress Notes (Unsigned)
Therapist met with client as scheduled and discussed treatment plan/case management progress. Therapist used active listening skills to provide client with opportunity to disclose previous challenges and successes. Therapist assessed for SI/HI during session and will follow-up with client during the next session.  Client met with therapist as scheduled. Client presented alert mood and appeared to euthymic; affect was congruent with client report of mood. Client was able to make connections between consequences, challenges and alternative thoughts of mental health recovery during this session. Client continues to express their needs in the development of treatment goals and described different communities of which can serve an added support for them. Client expressed concerns about different problematic areas in their life, particularly his bills. Client also talked about his process of trying to re-instate his SSI\SSDI. Clinician informed client that she could not provide any advice but she could suggest alternative resources. Client also talked about obtaining representation about money owed to social security. Progress toward therapeutic goal(s) continues to remain minimal at this time. Client denied SI/HI.

## 2021-01-12 ENCOUNTER — Other Ambulatory Visit: Payer: Self-pay

## 2021-01-12 ENCOUNTER — Ambulatory Visit: Payer: Self-pay

## 2021-01-12 ENCOUNTER — Encounter: Payer: Self-pay | Admitting: Infectious Diseases
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
## 2021-01-19 ENCOUNTER — Other Ambulatory Visit: Payer: Self-pay

## 2021-01-19 ENCOUNTER — Ambulatory Visit: Payer: Self-pay

## 2021-01-26 ENCOUNTER — Ambulatory Visit: Payer: Self-pay

## 2021-01-26 ENCOUNTER — Other Ambulatory Visit: Payer: Self-pay

## 2021-01-26 NOTE — Progress Notes (Unsigned)
Therapist met with client as scheduled and discussed treatment plan/case management progress. Therapist used active listening skills to provide client with opportunity to disclose previous challenges and successes. Therapist assessed for SI/HI during session and will follow-up with client during the next session.   Client met with therapist as scheduled. Client presented alert mood was euthymic; affect appeared to be congruent with client report of mood. Appearance was relaxed/casual, and attitude was appropriate and casual. Client was able to make connections between consequences, challenges and alternative thoughts of mental health recovery. Client continues to talk about his trauma and PTSD. Client progress toward goals in dealing with trauma remain minimal at this time.  Client denied SI/HI.

## 2021-01-28 NOTE — Progress Notes (Unsigned)
Therapist met with client as scheduled and discussed treatment plan/case management progress. Therapist used active listening skills to provide client with opportunity to disclose previous challenges and successes. Therapist assessed for SI/HI during session and will follow-up with client during the next session.  Client met with therapist as scheduled. Client presented alert mood and appeared to euthymic; affect was congruent with client report of mood. Client was able to make connections between consequences, challenges and alternative thoughts of mental health recovery during this session. Client continues to express their needs in the development of treatment goals and described different communities of which can serve an added support for them. Client talked about needing resources to assist with his mortgage and bills. Client and clinician also processed some of his issues with SSI/SSDI along with resources for reapplying for benefits. Client and clinician were able to process ideas about funding and renting his room out to graduate students. Progress toward therapeutic goal(s) continues to remain minimal at this time. Client denied SI/HI.

## 2021-02-02 ENCOUNTER — Ambulatory Visit: Payer: Self-pay

## 2021-02-02 ENCOUNTER — Other Ambulatory Visit: Payer: Self-pay

## 2021-02-09 ENCOUNTER — Other Ambulatory Visit: Payer: Self-pay

## 2021-02-09 ENCOUNTER — Ambulatory Visit: Payer: Self-pay

## 2021-02-09 NOTE — Progress Notes (Unsigned)
Therapist met with client as scheduled and discussed their progress. Therapist used active listening skills to provide client with opportunity to disclose previous challenges and successes since last session. Specific problem-solving skills were processed with client, including breaking down problems, brainstorming, evaluating, and choosing options. At times implementing a plan and evaluating and reevaluating results. Therapist assessed for SI/HI during session and will follow-up with client during the next session.    Client met with therapist as scheduled and presented alert; orient X4. At the start of session, client affect appeared euphoric; affect appeared to be congruent with client report. Client appeared to make connections between consequences, challenges and alternative thoughts of mental health recovery during this session. Client expressed their needs in the development of their treatment goals and described different communities of which can serve as an added support such as the [facility]. As client needed to assistance with filling out forms for disability. Client progress toward therapeutic goal(s) remain moderate at this time. Client denied SI/HI.

## 2021-02-09 NOTE — Progress Notes (Unsigned)
Therapist met with client as scheduled to continue rapport building. Therapist introduced and shared some information about herself and encouraged the same from client in order to build trust and openness within the counseling relationship. Therapist discussed and reviewed treatment plans with client based on the result from client's assessment. Therapist discussed client current mental health symptoms /diagnosis of DSM-5, as well as recommendations and target population for various eligibility services. Therapist assessed for SI/HI during session and will follow-up with client during the next session.  Client met with therapist as scheduled. Client presented alert mood and appeared to euthymic; affect was congruent with client report of mood. Client was able to make connections between consequences, challenges and alternative thoughts of mental health recovery during this session. Client continues to express their needs in the development of treatment goals and described different communities of which can serve an added support for them. Client talked about the progress he is making with getting paper work done for his disability and talked about his streaming online. Client intends to keep his psychiatric appointment with [facility] to get reassessed. Therapeutic goal(s) continues to remain minimal at this time. client denied SI/HI.

## 2021-02-16 ENCOUNTER — Ambulatory Visit: Payer: Self-pay

## 2021-02-16 ENCOUNTER — Other Ambulatory Visit: Payer: Self-pay

## 2021-02-18 ENCOUNTER — Ambulatory Visit: Payer: Self-pay | Admitting: Infectious Diseases

## 2021-02-18 NOTE — Progress Notes (Unsigned)
Therapist met with client as scheduled and discussed treatment plan/case management progress. Therapist used active listening skills to provide client with opportunity to disclose previous challenges and successes. Therapist assessed for SI/HI during session and will follow-up with client during the next session.  Client met with therapist as scheduled. Client presented alert mood and appeared to euthymic; affect was congruent with client report of mood. Client was able to make connections between consequences, challenges and alternative thoughts of mental health recovery during this session. Client continues to express their needs in the development of treatment goals and described different communities of which can serve an added support for them. Client continues to express his concerns about paying his mortgage; he stated that he is interested in a small job but has some concerns about his mental health. Client also expressed mixed emotions about his niece who is currently pregnant. Progress toward therapeutic goal(s) continues to remain minimal at this time. Client denied SI/HI.

## 2021-02-23 ENCOUNTER — Other Ambulatory Visit: Payer: Self-pay

## 2021-02-23 ENCOUNTER — Ambulatory Visit: Payer: Self-pay

## 2021-03-02 ENCOUNTER — Ambulatory Visit: Payer: Self-pay

## 2021-03-02 ENCOUNTER — Other Ambulatory Visit: Payer: Self-pay

## 2021-03-02 NOTE — Progress Notes (Unsigned)
Therapist met with client as scheduled and discussed treatment plan/case management progress. Therapist used active listening skills to provide client with opportunity to disclose previous challenges and successes. Therapist assessed for SI/HI during session and will follow-up with client during the next session.  Client met with therapist as scheduled. Client presented alert mood and appeared to euthymic; affect was congruent with client report of mood. Client continues to express their needs in the development of treatment goals and described different communities of which can serve an added support for them. Client continues to express his concerns about paying his mortgage and his current situation regarding his disability status. Client talks about his PTSD and trauma every session; as he is beginning to understand more about himself and put the pieces together about why he does some of the things he does. Client talked about his past suicidality and depression and how he has lived with depression and anxiety for a large portion of his live. Client seems to have gained more self-efficacy over the past few months of processing trauma; however, client describes his trauma as haunting at times. Progress toward therapeutic goal(s) continues to remain minimal at this time. Client denied SI/HI.

## 2021-03-02 NOTE — Progress Notes (Unsigned)
Therapist met with client as scheduled and discussed treatment plan/case management progress. Therapist used active listening skills to provide client with opportunity to disclose previous challenges and successes. Therapist assessed for SI/HI during session and will follow-up with client during the next session.  Client met with therapist as scheduled. Client presented alert mood and appeared to euthymic; affect was congruent with client report of mood. Client continues to express their needs in the development of treatment goals and described different communities of which can serve an added support for them. Client continues to express his concerns about paying his mortgage and his current situation regarding his disability status. Client reports that he reported additional information with the social security administration. Client continues to talk about the affects of his disability. Progress toward therapeutic goal(s) continues to remain minimal at this time. Client denied SI/HI.

## 2021-03-08 ENCOUNTER — Encounter (HOSPITAL_COMMUNITY): Payer: Self-pay | Admitting: Student in an Organized Health Care Education/Training Program

## 2021-03-08 ENCOUNTER — Other Ambulatory Visit: Payer: Self-pay

## 2021-03-08 ENCOUNTER — Ambulatory Visit (INDEPENDENT_AMBULATORY_CARE_PROVIDER_SITE_OTHER): Payer: No Payment, Other | Admitting: Student in an Organized Health Care Education/Training Program

## 2021-03-08 VITALS — BP 149/101 | HR 68 | Ht 71.0 in | Wt 204.0 lb

## 2021-03-08 DIAGNOSIS — F4312 Post-traumatic stress disorder, chronic: Secondary | ICD-10-CM | POA: Diagnosis not present

## 2021-03-08 MED ORDER — ZOLOFT 50 MG PO TABS: 50.0000 mg | ORAL_TABLET | Freq: Every day | ORAL | 2 refills | Status: DC
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
## 2021-03-09 ENCOUNTER — Ambulatory Visit: Payer: Self-pay

## 2021-03-09 ENCOUNTER — Ambulatory Visit (INDEPENDENT_AMBULATORY_CARE_PROVIDER_SITE_OTHER): Payer: Self-pay | Admitting: Infectious Diseases

## 2021-03-09 ENCOUNTER — Other Ambulatory Visit: Payer: Self-pay

## 2021-03-09 VITALS — BP 152/72 | HR 73 | Resp 16 | Ht 71.0 in | Wt 206.3 lb

## 2021-03-09 DIAGNOSIS — I10 Essential (primary) hypertension: Secondary | ICD-10-CM

## 2021-03-09 DIAGNOSIS — B20 Human immunodeficiency virus [HIV] disease: Secondary | ICD-10-CM

## 2021-03-09 DIAGNOSIS — Z113 Encounter for screening for infections with a predominantly sexual mode of transmission: Secondary | ICD-10-CM

## 2021-03-09 DIAGNOSIS — Z8619 Personal history of other infectious and parasitic diseases: Secondary | ICD-10-CM

## 2021-03-09 DIAGNOSIS — F4310 Post-traumatic stress disorder, unspecified: Secondary | ICD-10-CM

## 2021-03-09 DIAGNOSIS — Z21 Asymptomatic human immunodeficiency virus [HIV] infection status: Secondary | ICD-10-CM

## 2021-03-09 NOTE — Assessment & Plan Note (Addendum)
Continue odefsey once a day - update labs today that are due - CBC, lipid, CD4, VL and RPR. STI screening.  ?RTC around July for ADAP renewal and in 22mfor regular follow up care.  ?

## 2021-03-09 NOTE — Assessment & Plan Note (Signed)
Spent time discussing progress with counseling team today. Counseled sertraline is OK to take with HIV medication. If it makes his depression worse or has suicidal thoughts recommend he reach out to psychiatry team. He is willing to retrial this.  ?

## 2021-03-09 NOTE — Assessment & Plan Note (Signed)
Continue current dose - little high anxiety today. Will continue to monitor at current dose. Has regained about 10# and may need dose increase.  ?

## 2021-03-09 NOTE — Assessment & Plan Note (Signed)
Will screen with RPR today  ?

## 2021-03-09 NOTE — Progress Notes (Signed)
? ?Name: [name]  ?DOB: [birth year] ?MRN: [id number] ?PCP: Sid Falcon, MD  ? ? ?Brief Narrative:  ?Marqueze Ramcharan is a 61 y.o. male with well controlled HIV.  ?CD4 nadir unknown ? ?HIV Risk: MSM/Bisexual ?History of OIs: None known ?Intake Labs: 2013 ?Hep B sAg (-), sAb (+); Hep A (vax 2014), Hep C (-) ?Quantiferon (-) ?HLA B*5701 ([) ?G6PD: () ? ? ?Previous Regimens: ?Complera ?Odefsey >> undetectable  ? ?Genotypes: ?None on file  ? ?Subjective:  ? ?Chief Complaint  ?Patient presents with  ? Follow-up  ? ? ? ? ?HPI: ?Here today for follow up. We spent time discussing his work for PTSD care and counseling. Recommended to start zoloft for depression from his psychiatrist. We spent time discussing his history of severe anxiety / PTSD. Made association that he should completley abstain from alcohol to avoid further depression effect. Has had several partners lately and requesting STI screening - no symptoms concerning to him.  ? ? ?Review of Systems  ?Constitutional:  Negative for chills, fever and weight loss.  ?HENT:  Negative for tinnitus.   ?Eyes:  Negative for blurred vision and photophobia.  ?Respiratory:  Negative for cough and sputum production.   ?Cardiovascular:  Negative for chest pain.  ?Gastrointestinal:  Negative for abdominal pain, diarrhea, nausea and vomiting.  ?Genitourinary:  Negative for dysuria.  ?Musculoskeletal:  Negative for back pain and joint pain.  ?Skin:  Negative for rash.  ?Neurological:  Negative for weakness and headaches.  ?Psychiatric/Behavioral:  Negative for depression and suicidal ideas. The patient has insomnia. The patient is not nervous/anxious.   ? ?Past Medical History:  ?Diagnosis Date  ? Anxiety   ? HIV disease ([place])   ? Hypertension   ? Pneumonia   ? Syphilis   ? ? ?Outpatient Medications Prior to Visit  ?Medication Sig Dispense Refill  ? ASHWAGANDHA PO Take by mouth.    ? emtricitabine-rilpivir-tenofovir AF (ODEFSEY) 200-25-25 MG TABS tablet Take 1 tablet by mouth  daily. 30 tablet 6  ? fluticasone (FLONASE) 50 MCG/ACT nasal spray SHAKE LIQUID AND USE 1 SPRAY IN EACH NOSTRIL DAILY 16 g 11  ? Loratadine (CLARITIN PO) Take 10 mg by mouth daily. Equate brand    ? Magnesium 400 MG CAPS Take 1 capsule by mouth daily.    ? Misc Natural Products (IMMUNE TRIO PO) Take 1 tablet by mouth.    ? Misc Natural Products (OSTEO BI-FLEX ADV JOINT SHIELD) TABS Take 1 tablet by mouth.    ? Misc Natural Products (PROSTATE HEALTH) CAPS Take 1 capsule by mouth daily.    ? Multiple Vitamin (MULTIVITAMIN) tablet Take 1 tablet by mouth daily. Men +50 Centrum    ? sildenafil (VIAGRA) 100 MG tablet Take 1 tablet (100 mg total) by mouth daily as needed for erectile dysfunction. 30 tablet 2  ? TURMERIC PO Take by mouth.    ? valsartan (DIOVAN) 80 MG tablet Take 1 tablet (80 mg total) by mouth daily. 30 tablet 5  ? ZOLOFT 50 MG tablet Take 1 tablet (50 mg total) by mouth daily. 30 tablet 2  ? ?Facility-Administered Medications Prior to Visit  ?Medication Dose Route Frequency Provider Last Rate Last Admin  ? penicillin g benzathine (BICILLIN LA) 1200000 UNIT/2ML injection 2.4 Million Units  2.4 Million Units Intramuscular Once [place] Callas, NP      ?  ? ?Allergies  ?Allergen Reactions  ? Sulfamethoxazole-Trimethoprim Other (See Comments)  ?  Unknown allergic reaction  ? Sulfonamide  Derivatives   ? ? ?Social History  ? ?Tobacco Use  ? Smoking status: Never  ? Smokeless tobacco: Never  ?Substance Use Topics  ? Alcohol use: Not Currently  ?  Alcohol/week: 1.0 standard drink  ?  Types: 1 Standard drinks or equivalent per week  ?  Comment: social  ? Drug use: No  ? ? ? ?Social History  ? ?Substance and Sexual Activity  ?Sexual Activity Yes  ? Partners: Male  ? Birth control/protection: Condom  ? Comment: DECLINED CONDOMS  ? ? ? ?Objective:  ? ?Vitals:  ? 03/09/21 1432  ?BP: (!) 152/72  ?Pulse: 73  ?Resp: 16  ?SpO2: 99%  ?Weight: 206 lb 4.8 oz (93.6 kg)  ?Height: _0  (1.803 m)  ? ?Body mass index is  28.77 kg/m?. ? ? ?Physical Exam ?HENT:  ?   Mouth/Throat:  ?   Mouth: No oral lesions.  ?   Dentition: Normal dentition. No dental caries.  ?Eyes:  ?   General: No scleral icterus. ?Cardiovascular:  ?   Rate and Rhythm: Normal rate and regular rhythm.  ?   Heart sounds: Normal heart sounds.  ?Pulmonary:  ?   Effort: Pulmonary effort is normal.  ?   Breath sounds: Normal breath sounds.  ?Abdominal:  ?   General: There is no distension.  ?   Palpations: Abdomen is soft.  ?   Tenderness: There is no abdominal tenderness.  ?Lymphadenopathy:  ?   Cervical: No cervical adenopathy.  ?Skin: ?   General: Skin is warm and dry.  ?   Findings: No rash.  ?Neurological:  ?   Mental Status: He is alert and oriented to person, place, and time.  ? ? ?Lab Results ?Lab Results  ?Component Value Date  ? WBC 10.2 06/30/2020  ? HGB 11.3 (L) 06/30/2020  ? HCT 34.8 (L) 06/30/2020  ? MCV 93.3 06/30/2020  ? PLT 434 (H) 06/30/2020  ?  ?Lab Results  ?Component Value Date  ? CREATININE 0.99 06/30/2020  ? BUN 9 06/30/2020  ? NA 137 06/30/2020  ? K 4.4 06/30/2020  ? CL 102 06/30/2020  ? CO2 29 06/30/2020  ?  ?Lab Results  ?Component Value Date  ? ALT 26 06/30/2020  ? AST 22 06/30/2020  ? ALKPHOS 69 04/27/2016  ? BILITOT 0.4 06/30/2020  ?  ?Lab Results  ?Component Value Date  ? CHOL 199 02/11/2019  ? HDL 64 02/11/2019  ? [place] 94 02/11/2019  ? TRIG 286 (H) 02/11/2019  ? CHOLHDL 3.1 02/11/2019  ? ?HIV 1 RNA Quant (Copies/mL)  ?Date Value  ?10/19/2020 Not Detected  ?06/30/2020 23 (H)  ?02/11/2020 <20  ? ?CD4 T Cell Abs (/uL)  ?Date Value  ?06/30/2020 1,413  ?11/20/2019 1,011  ?02/11/2019 1,286  ? ? ? ?Assessment & Plan:  ? ?Patient Active Problem List  ? Diagnosis Date Noted  ? Chronic pain of left thumb 02/11/2020  ? Prostate hypertrophy 04/27/2016  ? IBS (irritable bowel syndrome) 04/08/2014  ? Erectile dysfunction 03/15/2012  ? Renal insufficiency 06/22/2011  ? TIA (transient ischemic attack) 03/22/2011  ? Hypertension 03/22/2011  ? PTSD  (post-traumatic stress disorder) 03/22/2011  ? Hypogonadism male 03/22/2011  ? Macrocytic anemia 03/22/2011  ? Anxiety and depression 03/22/2011  ? History of syphilis 03/09/2007  ? GLUCOSE-6-PHOSPHATE DEHYDROGENASE DEFICIENCY 03/09/2007  ? Allergic sinusitis 01/24/2007  ? PRESBYOPIA 06/19/2006  ? Human immunodeficiency virus (HIV) disease ([place]) 10/04/2005  ? GENITAL HERPES 10/04/2005  ? ? ? ?Problem List Items Addressed  This Visit   ? ?  ? Unprioritized  ? Human immunodeficiency virus (HIV) disease ([place]) (Chronic)  ?  Continue odefsey once a day - update labs today that are due - CBC, lipid, CD4, VL and RPR. STI screening.  ?RTC around July for ADAP renewal and in 110mfor regular follow up care.  ?  ?  ? History of syphilis  ?  Will screen with RPR today  ?  ?  ? Relevant Orders  ? RPR  ? Hypertension  ?  Continue current dose - little high anxiety today. Will continue to monitor at current dose. Has regained about 10# and may need dose increase.  ?  ?  ? PTSD (post-traumatic stress disorder)  ?  Spent time discussing progress with counseling team today. Counseled sertraline is OK to take with HIV medication. If it makes his depression worse or has suicidal thoughts recommend he reach out to psychiatry team. He is willing to retrial this.  ?  ?  ? ?Other Visit Diagnoses   ? ? Asymptomatic HIV infection, with no history of HIV-related illness ([facility]    -  Primary  ? Relevant Orders  ? HIV-1 RNA quant-no reflex-bld  ? CBC  ? Lipid panel  ? [facility]  ? Routine screening for STI (sexually transmitted infection)      ? Relevant Orders  ? C. trachomatis/N. gonorrhoeae RNA  ? GC/CT Probe, Amp (Throat)  ? CT/NG RNA, TMA Rectal  ? ?  ? ?SJanene Madeira MSN, NP-C ?[facility] ?[facility] ?Pager: [phone]?Office: [phone]? ?03/09/21  ?4:47 PM ? ? ?

## 2021-03-09 NOTE — Patient Instructions (Signed)
Sertraline (Zoloft) is OK with your Odefsey.  ?

## 2021-03-12 ENCOUNTER — Telehealth: Payer: Self-pay

## 2021-03-12 LAB — CBC
HCT: 45.8 % (ref 38.5–50.0)
Hemoglobin: 15.9 g/dL (ref 13.2–17.1)
MCH: 33.4 pg — ABNORMAL HIGH (ref 27.0–33.0)
MCHC: 34.7 g/dL (ref 32.0–36.0)
MCV: 96.2 fL (ref 80.0–100.0)
MPV: 10.7 fL (ref 7.5–12.5)
Platelets: 233 10*3/uL (ref 140–400)
RBC: 4.76 10*6/uL (ref 4.20–5.80)
RDW: 11.3 % (ref 11.0–15.0)
WBC: 7.9 10*3/uL (ref 3.8–10.8)

## 2021-03-12 LAB — LIPID PANEL
Cholesterol: 169 mg/dL (ref <200)
HDL: 57 mg/dL (ref >=40)
LDL Cholesterol (Calc): 94 mg/dL (calc)
Non-HDL Cholesterol (Calc): 112 mg/dL (calc) (ref <130)
Total CHOL/HDL Ratio: 3 (calc) (ref <5.0)
Triglycerides: 85 mg/dL (ref <150)

## 2021-03-12 LAB — T-HELPER CELLS (CD4) COUNT (NOT AT ARMC)
Absolute CD4: 1191 cells/uL (ref 490–1740)
CD4 T Helper %: 42 % (ref 30–61)
Total lymphocyte count: 2833 cells/uL (ref 850–3900)

## 2021-03-12 LAB — RPR: RPR Ser Ql: REACTIVE — AB

## 2021-03-12 LAB — GC/CHLAMYDIA PROBE, AMP (THROAT)
Chlamydia trachomatis RNA: NOT DETECTED
Neisseria gonorrhoeae RNA: NOT DETECTED

## 2021-03-12 LAB — CT/NG RNA, TMA RECTAL

## 2021-03-12 LAB — FLUORESCENT TREPONEMAL AB(FTA)-IGG-BLD: Fluorescent Treponemal ABS: REACTIVE — AB

## 2021-03-12 LAB — RPR TITER: RPR Titer: 1:8 {titer} — ABNORMAL HIGH

## 2021-03-12 LAB — C. TRACHOMATIS/N. GONORRHOEAE RNA
C. trachomatis RNA, TMA: NOT DETECTED
N. gonorrhoeae RNA, TMA: NOT DETECTED

## 2021-03-12 LAB — HIV-1 RNA QUANT-NO REFLEX-BLD
HIV 1 RNA Quant: NOT DETECTED Copies/mL
HIV-1 RNA Quant, Log: NOT DETECTED Log cps/mL

## 2021-03-12 NOTE — Telephone Encounter (Signed)
-----   Message from [place] Callas, NP sent at 03/12/2021  8:08 AM EST ----- ?Please cal Max Ray to let him know his labs are perfect - viral load is not detected, CD4 is perfect and continues to be high, Syphilis test is coming down as I would like to see. Will repeat this again for him to continue monitoring. His cholesterol looks perfect and so far the swabs that?s have returned are negative. Will call if any others return positive. ?

## 2021-03-12 NOTE — Telephone Encounter (Signed)
Patient aware of results.   Max Ray P Fauna Neuner, CMA  

## 2021-03-15 ENCOUNTER — Telehealth: Payer: Self-pay

## 2021-03-15 NOTE — Telephone Encounter (Signed)
-----   Message from [place] Callas, NP sent at 03/15/2021  9:48 AM EDT ----- ?If we can accommodate him to recollect when he comes tomorrow for regular FU with Arbie Cookey we certainly can. He has had several partners so would like to ensure he is bacteria free! ? ?----- Message ----- ?From: Beryle Flock, RN ?Sent: 03/15/2021   9:46 AM EDT ?To: [place] Callas, NP ? ?Rectal swab unable to be processed due to insufficient sample, do you want him to recollect? Thanks! ? ? ?

## 2021-03-16 ENCOUNTER — Ambulatory Visit: Payer: Self-pay

## 2021-03-18 NOTE — Progress Notes (Unsigned)
Therapist met with client as scheduled and discussed their progress. Therapist used active listening skills to provide client with opportunity to disclose previous challenges and successes since last session. Specific problem-solving skills were processed with client, including breaking down problems, brainstorming, evaluating, and choosing options. At times implementing a plan and evaluating and reevaluating results. Therapist assessed for SI/HI during session and will follow-up with client during the next session. ? ?Client met with therapist as scheduled and presented alert; orient X4. At the start of session, client affect appeared euthymic affect appeared to be congruent with client report. Client expressed their needs in the development of their treatment goals and described different communities of which can serve as an added support such as continued benefits with SSI/SSDI. Client expressed that he needed some paperwork from clinician the support his diagnosis. Client expressed his anxiety about having to visit his mother in law due to her sassiness and talkative nature. Client continues to report irritability and mood swings. Client progress toward therapeutic goal(s) remain moderate at this time. Client denied SI/HI. ?

## 2021-03-23 ENCOUNTER — Ambulatory Visit: Payer: Self-pay

## 2021-03-23 ENCOUNTER — Other Ambulatory Visit: Payer: Self-pay | Admitting: Infectious Diseases

## 2021-03-23 DIAGNOSIS — B20 Human immunodeficiency virus [HIV] disease: Secondary | ICD-10-CM

## 2021-03-23 DIAGNOSIS — I10 Essential (primary) hypertension: Secondary | ICD-10-CM

## 2021-03-30 ENCOUNTER — Ambulatory Visit: Payer: Self-pay

## 2021-04-05 ENCOUNTER — Ambulatory Visit (INDEPENDENT_AMBULATORY_CARE_PROVIDER_SITE_OTHER): Payer: No Payment, Other | Admitting: Student in an Organized Health Care Education/Training Program

## 2021-04-05 DIAGNOSIS — F4312 Post-traumatic stress disorder, chronic: Secondary | ICD-10-CM | POA: Diagnosis not present

## 2021-04-05 MED ORDER — ZOLOFT 50 MG PO TABS: 50.0000 mg | ORAL_TABLET | Freq: Every day | ORAL | 2 refills | Status: DC
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
## 2021-05-12 ENCOUNTER — Other Ambulatory Visit (HOSPITAL_COMMUNITY): Payer: Self-pay

## 2021-05-24 ENCOUNTER — Ambulatory Visit (INDEPENDENT_AMBULATORY_CARE_PROVIDER_SITE_OTHER): Payer: No Payment, Other | Admitting: Student in an Organized Health Care Education/Training Program

## 2021-05-24 DIAGNOSIS — F4312 Post-traumatic stress disorder, chronic: Secondary | ICD-10-CM | POA: Diagnosis not present

## 2021-05-24 MED ORDER — SERTRALINE HCL 50 MG PO TABS: 50.0000 mg | ORAL_TABLET | Freq: Every day | ORAL | 2 refills | Status: DC
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
## 2021-06-14 ENCOUNTER — Encounter: Payer: Self-pay | Admitting: Infectious Diseases

## 2021-08-09 ENCOUNTER — Ambulatory Visit: Payer: Self-pay

## 2021-08-09 ENCOUNTER — Other Ambulatory Visit: Payer: Self-pay

## 2021-08-09 ENCOUNTER — Encounter: Payer: Self-pay | Admitting: Infectious Diseases

## 2021-08-09 ENCOUNTER — Ambulatory Visit (INDEPENDENT_AMBULATORY_CARE_PROVIDER_SITE_OTHER): Payer: PRIVATE HEALTH INSURANCE | Admitting: Infectious Diseases

## 2021-08-09 VITALS — BP 148/91 | HR 66 | Temp 97.7°F | Ht 70.0 in | Wt 198.0 lb

## 2021-08-09 DIAGNOSIS — Z21 Asymptomatic human immunodeficiency virus [HIV] infection status: Secondary | ICD-10-CM | POA: Diagnosis not present

## 2021-08-09 DIAGNOSIS — Z8619 Personal history of other infectious and parasitic diseases: Secondary | ICD-10-CM

## 2021-08-09 DIAGNOSIS — I10 Essential (primary) hypertension: Secondary | ICD-10-CM

## 2021-08-09 DIAGNOSIS — B20 Human immunodeficiency virus [HIV] disease: Secondary | ICD-10-CM | POA: Diagnosis not present

## 2021-08-09 DIAGNOSIS — F419 Anxiety disorder, unspecified: Secondary | ICD-10-CM

## 2021-08-09 DIAGNOSIS — Z113 Encounter for screening for infections with a predominantly sexual mode of transmission: Secondary | ICD-10-CM

## 2021-08-09 DIAGNOSIS — F32A Depression, unspecified: Secondary | ICD-10-CM

## 2021-08-09 MED ORDER — VALSARTAN 80 MG PO TABS: ORAL_TABLET | ORAL | 5 refills | Status: DC

## 2021-08-09 MED ORDER — ODEFSEY 200-25-25 MG PO TABS: 1.0000 | ORAL_TABLET | Freq: Every day | ORAL | 11 refills | Status: DC

## 2021-08-09 NOTE — Progress Notes (Signed)
Name: Max Ray  DOB: [birth year] MRN: [id number] PCP: Pcp, No    Brief Narrative:  Max Ray is a 61 y.o. male with well controlled HIV.  CD4 nadir unknown  HIV Risk: MSM/Bisexual History of OIs: None known Intake Labs: 2013 Hep B sAg (-), sAb (+); Hep A (vax 2014), Hep C (-) Quantiferon (-) HLA B*5701 ([) G6PD: ()   Previous Regimens: Complera Odefsey >> undetectable   Genotypes: None on file   Subjective:   Chief Complaint  Patient presents with   Follow-up    B20      HPI: Here for follow up HIV care.  Frustrated today with finances - trying to work through disability and they are not being helpful for him. Lost paperwork for his case.   Continues taking his Odefsey everyday without any missed doses.   Continues to do beet powder and fruit/vegetable smoothies, tumeric and other supplements that help him to feel and operate well. Still with more fatigue and trouble sleeping.   Working with [facility] and started on sertraline. He has not had to come back to do counseling sessions since starting this. Keeps in touch with her but has been doing better lately. Spent time catching up on financial stressors, joys with taking care of his horse, etc.     Review of Systems  Constitutional:  Negative for chills, fever and weight loss.  HENT:  Negative for tinnitus.   Eyes:  Negative for blurred vision and photophobia.  Respiratory:  Negative for cough and sputum production.   Cardiovascular:  Negative for chest pain.  Gastrointestinal:  Negative for abdominal pain, diarrhea, nausea and vomiting.  Genitourinary:  Negative for dysuria.  Musculoskeletal:  Negative for back pain and joint pain.  Skin:  Negative for rash.  Neurological:  Negative for weakness and headaches.  Psychiatric/Behavioral:  Negative for depression and suicidal ideas. The patient has insomnia. The patient is not nervous/anxious.     Past Medical History:  Diagnosis Date   Anxiety     HIV disease ([place])    Hypertension    Pneumonia    Syphilis     Outpatient Medications Prior to Visit  Medication Sig Dispense Refill   ASHWAGANDHA PO Take by mouth.     fluticasone (FLONASE) 50 MCG/ACT nasal spray SHAKE LIQUID AND USE 1 SPRAY IN EACH NOSTRIL DAILY 16 g 11   Loratadine (CLARITIN PO) Take 10 mg by mouth daily. Equate brand     Magnesium 400 MG CAPS Take 1 capsule by mouth daily.     Misc Natural Products (IMMUNE TRIO PO) Take 1 tablet by mouth.     Misc Natural Products (OSTEO BI-FLEX ADV JOINT SHIELD) TABS Take 1 tablet by mouth.     Misc Natural Products (PROSTATE HEALTH) CAPS Take 1 capsule by mouth daily.     Multiple Vitamin (MULTIVITAMIN) tablet Take 1 tablet by mouth daily. Men +50 Centrum     sertraline (ZOLOFT) 50 MG tablet Take 1 tablet (50 mg total) by mouth daily. 30 tablet 2   sildenafil (VIAGRA) 100 MG tablet Take 1 tablet (100 mg total) by mouth daily as needed for erectile dysfunction. 30 tablet 2   TURMERIC PO Take by mouth.     ODEFSEY 200-25-25 MG TABS tablet TAKE 1 TABLET BY MOUTH EVERY DAY 30 tablet 6   valsartan (DIOVAN) 80 MG tablet TAKE 1 TABLET(80 MG) BY MOUTH DAILY 30 tablet 5   Facility-Administered Medications Prior to Visit  Medication Dose Route  Frequency Provider Last Rate Last Admin   penicillin g benzathine (BICILLIN LA) 1200000 UNIT/2ML injection 2.4 Million Units  2.4 Million Units Intramuscular Once [place] Callas, NP         Allergies  Allergen Reactions   Sulfamethoxazole-Trimethoprim Other (See Comments)    Unknown allergic reaction   Sulfonamide Derivatives     Social History   Tobacco Use   Smoking status: Never   Smokeless tobacco: Never  Substance Use Topics   Alcohol use: Not Currently    Alcohol/week: 1.0 standard drink of alcohol    Types: 1 Standard drinks or equivalent per week    Comment: social   Drug use: No     Social History   Substance and Sexual Activity  Sexual Activity Yes   Partners: Male    Birth control/protection: Condom   Comment: DECLINED CONDOMS     Objective:   Vitals:   08/09/21 0950  BP: (!) 148/91  Pulse: 66  Temp: 97.7 F (36.5 C)  TempSrc: Oral  Weight: 198 lb (89.8 kg)  Height: 5' 10"  (1.778 m)   Body mass index is 28.41 kg/m.   Physical Exam HENT:     Mouth/Throat:     Mouth: No oral lesions.     Dentition: Normal dentition. No dental caries.  Eyes:     General: No scleral icterus. Cardiovascular:     Rate and Rhythm: Normal rate and regular rhythm.     Heart sounds: Normal heart sounds.  Pulmonary:     Effort: Pulmonary effort is normal.     Breath sounds: Normal breath sounds.  Abdominal:     General: There is no distension.     Palpations: Abdomen is soft.     Tenderness: There is no abdominal tenderness.  Lymphadenopathy:     Cervical: No cervical adenopathy.  Skin:    General: Skin is warm and dry.     Findings: No rash.  Neurological:     Mental Status: He is alert and oriented to person, place, and time.     Lab Results Lab Results  Component Value Date   WBC 7.9 03/09/2021   HGB 15.9 03/09/2021   HCT 45.8 03/09/2021   MCV 96.2 03/09/2021   PLT 233 03/09/2021    Lab Results  Component Value Date   CREATININE 0.99 06/30/2020   BUN 9 06/30/2020   NA 137 06/30/2020   K 4.4 06/30/2020   CL 102 06/30/2020   CO2 29 06/30/2020    Lab Results  Component Value Date   ALT 26 06/30/2020   AST 22 06/30/2020   ALKPHOS 69 04/27/2016   BILITOT 0.4 06/30/2020    Lab Results  Component Value Date   CHOL 169 03/09/2021   HDL 57 03/09/2021   LDLCALC 94 03/09/2021   TRIG 85 03/09/2021   CHOLHDL 3.0 03/09/2021   HIV 1 RNA Quant (Copies/mL)  Date Value  03/09/2021 Not Detected  10/19/2020 Not Detected  06/30/2020 23 (H)   CD4 T Cell Abs (/uL)  Date Value  06/30/2020 1,413  11/20/2019 1,011  02/11/2019 1,286     Assessment & Plan:   Patient Active Problem List   Diagnosis Date Noted   Prostate hypertrophy  04/27/2016   IBS (irritable bowel syndrome) 04/08/2014   Erectile dysfunction 03/15/2012   Renal insufficiency 06/22/2011   TIA (transient ischemic attack) 03/22/2011   Hypertension 03/22/2011   PTSD (post-traumatic stress disorder) 03/22/2011   Hypogonadism male 03/22/2011   Macrocytic anemia 03/22/2011  Anxiety and depression 03/22/2011   History of syphilis 03/09/2007   GLUCOSE-6-PHOSPHATE DEHYDROGENASE DEFICIENCY 03/09/2007   Allergic sinusitis 01/24/2007   PRESBYOPIA 06/19/2006   Human immunodeficiency virus (HIV) disease ([place]) 10/04/2005   GENITAL HERPES 10/04/2005     Problem List Items Addressed This Visit       Unprioritized   Human immunodeficiency virus (HIV) disease ([place]) (Chronic)    Very well controlled on once daily Odefsey with food. No concerns with access or adherence to medication. They are tolerating the medication well without side effects. No drug interactions identified. Pertinent lab tests ordered today.  Reminded about ADAP re-enrollment in July / January - will meet with them today.  No dental needs today.  Sexual health and family planning discussed - screening ordered today. No symptoms at present.  Vaccines discussed - recommend flu vaccine in the fall. Discussed he would qualify for new COVID vaccine as well - he will consider.    Return in about 6 months (around 02/09/2022).        Relevant Medications   emtricitabine-rilpivir-tenofovir AF (ODEFSEY) 200-25-25 MG TABS tablet   Hypertension    Slightly increased today with stress - will continue current dose. If still elevated in 37mmay consider increase to next dose up. For now continue valsartan 80 mg daily.   BP Readings from Last 3 Encounters:  08/09/21 (!) 148/91  03/09/21 (!) 152/72  10/19/20 129/86         Relevant Medications   valsartan (DIOVAN) 80 MG tablet   History of syphilis    Screen today - no symptoms.       Anxiety and depression    Following with psychiatry team and  has been using our counselor here as needed. Feels like sertraline has been working well and achieved what he needed for mood.       Other Visit Diagnoses     Asymptomatic HIV infection, with no history of HIV-related illness ([facility]    -  Primary   Relevant Medications   emtricitabine-rilpivir-tenofovir AF (ODEFSEY) 200-25-25 MG TABS tablet   Other Relevant Orders   HIV 1 RNA quant-no reflex-bld   [facility]   Routine screening for STI (sexually transmitted infection)       Relevant Orders   Cytology (oral, anal, urethral) ancillary only   Cytology (oral, anal, urethral) ancillary only   Urine cytology ancillary only   RPR   Essential hypertension       Relevant Medications   valsartan (DIOVAN) 80 MG tablet     SJanene Madeira MSN, NP-C RJoyce Eisenberg Keef[facility] Centerfor Infectious DKearneyPager: [phone]: [phone] 08/09/21  10:19 AM

## 2021-08-09 NOTE — Assessment & Plan Note (Signed)
Slightly increased today with stress - will continue current dose. If still elevated in 95mmay consider increase to next dose up. For now continue valsartan 80 mg daily.   BP Readings from Last 3 Encounters:  08/09/21 (!) 148/91  03/09/21 (!) 152/72  10/19/20 129/86

## 2021-08-09 NOTE — Patient Instructions (Addendum)
So nice to see you!  Please stop by the financial team today to update your medication coverage.   Please continue your odefsey everyday with food like you have been.   Please plan to get your flu shot around October (we can get it to you for free here)   Would like to see you back in 6 months

## 2021-08-09 NOTE — Assessment & Plan Note (Signed)
Very well controlled on once daily Odefsey with food. No concerns with access or adherence to medication. They are tolerating the medication well without side effects. No drug interactions identified. Pertinent lab tests ordered today.  Reminded about ADAP re-enrollment in July / January - will meet with them today.  No dental needs today.  Sexual health and family planning discussed - screening ordered today. No symptoms at present.  Vaccines discussed - recommend flu vaccine in the fall. Discussed he would qualify for new COVID vaccine as well - he will consider.    Return in about 6 months (around 02/09/2022).

## 2021-08-09 NOTE — Assessment & Plan Note (Signed)
Following with psychiatry team and has been using our counselor here as needed. Feels like sertraline has been working well and achieved what he needed for mood.

## 2021-08-09 NOTE — Assessment & Plan Note (Signed)
Screen today - no symptoms.

## 2021-08-10 LAB — CYTOLOGY, (ORAL, ANAL, URETHRAL) ANCILLARY ONLY
Chlamydia: NEGATIVE
Chlamydia: NEGATIVE
Comment: NEGATIVE
Comment: NEGATIVE
Comment: NORMAL
Comment: NORMAL
Neisseria Gonorrhea: NEGATIVE
Neisseria Gonorrhea: NEGATIVE

## 2021-08-10 LAB — URINE CYTOLOGY ANCILLARY ONLY
Chlamydia: NEGATIVE
Comment: NEGATIVE
Comment: NORMAL
Neisseria Gonorrhea: NEGATIVE

## 2021-08-10 LAB — T-HELPER CELLS (CD4) COUNT (NOT AT ARMC)
CD4 % Helper T Cell: 39 % (ref 33–65)
CD4 T Cell Abs: 675 /uL (ref 400–1790)

## 2021-08-11 LAB — RPR TITER: RPR Titer: 1:8 {titer} — ABNORMAL HIGH

## 2021-08-11 LAB — HIV-1 RNA QUANT-NO REFLEX-BLD
HIV 1 RNA Quant: <20 Copies/mL — ABNORMAL HIGH
HIV-1 RNA Quant, Log: <1.3 Log cps/mL — ABNORMAL HIGH

## 2021-08-11 LAB — RPR: RPR Ser Ql: REACTIVE — AB

## 2021-08-11 LAB — FLUORESCENT TREPONEMAL AB(FTA)-IGG-BLD: Fluorescent Treponemal ABS: REACTIVE — AB

## 2021-08-13 ENCOUNTER — Other Ambulatory Visit: Payer: Self-pay

## 2021-08-13 ENCOUNTER — Other Ambulatory Visit (HOSPITAL_COMMUNITY): Payer: Self-pay

## 2021-08-13 ENCOUNTER — Telehealth: Payer: Self-pay

## 2021-08-13 DIAGNOSIS — N529 Male erectile dysfunction, unspecified: Secondary | ICD-10-CM

## 2021-08-13 MED ORDER — SILDENAFIL CITRATE 100 MG PO TABS: 100.0000 mg | ORAL_TABLET | Freq: Every day | ORAL | 5 refills | Status: DC | PRN

## 2021-08-13 NOTE — Telephone Encounter (Signed)
Patient called office today to check on lab results from 8/7. Informed patient that STD testing came back negative. Viral load is less then 20 and Cd4 count was at 675.  Leatrice Jewels, RMA

## 2021-08-16 ENCOUNTER — Telehealth: Payer: Self-pay

## 2021-08-16 NOTE — Progress Notes (Signed)
Please call Max Ray to let him know that his labs look great:  Syphilis levels are down - indicates good treatment and no new infection  HIV VL is undetectable and his TCell count is 675. I think this is a bit lower than it was a year ago due to significant stress lately. I will repeat this again for him in 6 months to continue to follow the trend - hopefully it will be back up next visit. All routine swabs have returned negative as well.

## 2021-08-16 NOTE — Telephone Encounter (Signed)
Called patient to relay results, patient answered and disconnected the call.   Beryle Flock, RN

## 2021-08-16 NOTE — Telephone Encounter (Signed)
-----   Message from [place] Callas, NP sent at 08/16/2021  3:40 PM EDT ----- Please call Adi to let him know that his labs look great:  Syphilis levels are down - indicates good treatment and no new infection  HIV VL is undetectable and his TCell count is 675. I think this is a bit lower than it was a year ago due to significant stress lately. I will repeat this again for him in 6 months to continue to follow the trend - hopefully it will be back up next visit. All routine swabs have returned negative as well.

## 2021-08-17 NOTE — Telephone Encounter (Signed)
Called patient to discuss results, no answer. Left HIPAA compliant voicemail requesting callback.   Beryle Flock, RN

## 2021-08-18 NOTE — Telephone Encounter (Signed)
Third attempt to reach patient, no answer and voicemail left with previous attempt.   Beryle Flock, RN

## 2021-08-30 ENCOUNTER — Encounter (HOSPITAL_COMMUNITY): Payer: Self-pay

## 2021-08-30 ENCOUNTER — Telehealth (HOSPITAL_COMMUNITY): Payer: No Payment, Other | Admitting: Student in an Organized Health Care Education/Training Program

## 2021-09-10 ENCOUNTER — Telehealth (HOSPITAL_COMMUNITY): Payer: Self-pay | Admitting: *Deleted

## 2021-09-10 ENCOUNTER — Other Ambulatory Visit (HOSPITAL_COMMUNITY): Payer: Self-pay | Admitting: Student in an Organized Health Care Education/Training Program

## 2021-09-10 DIAGNOSIS — F4312 Post-traumatic stress disorder, chronic: Secondary | ICD-10-CM

## 2021-09-10 MED ORDER — SERTRALINE HCL 50 MG PO TABS: 50.0000 mg | ORAL_TABLET | Freq: Every day | ORAL | 0 refills | Status: DC

## 2021-09-10 NOTE — Telephone Encounter (Signed)
Called patient back with an appt with his provider and he and Dr Kai Levins who I asked to provide a bridge of medicine till his 09/21/21 at 1 pm appt corrceted me and my previous note it is sertraline he is needing,he is not taking trazodone. Dr Kai Levins will send it in.

## 2021-09-10 NOTE — Telephone Encounter (Signed)
Fax request from his pharmacy for a new rx for trazodone. He missed his Aug appt, last seen on 05/24/21 by Dr Candie Chroman. He does not have a future appt scheduled. Dr Candie Chroman is out of the office this week, will return on Monday the 11 th. Will forward this concern for her to look at on her return to the office. May want to give a bridge of meds and have him rescheduled.

## 2021-09-10 NOTE — Progress Notes (Signed)
Received message that patient needed refill of his Zoloft.  He has made an appointment to see Dr. Candie Chroman on 9/19 so we will provide him with 2 weeks worth of medication to bridge him to that appointment.   Sent: Zoloft 50 mg daily. 14 tablets with 0 refills.   Fatima Sanger MD Resident

## 2021-09-21 ENCOUNTER — Ambulatory Visit (INDEPENDENT_AMBULATORY_CARE_PROVIDER_SITE_OTHER): Payer: No Payment, Other | Admitting: Student in an Organized Health Care Education/Training Program

## 2021-09-21 DIAGNOSIS — F4312 Post-traumatic stress disorder, chronic: Secondary | ICD-10-CM

## 2021-09-21 MED ORDER — SERTRALINE HCL 50 MG PO TABS: 50.0000 mg | ORAL_TABLET | Freq: Every day | ORAL | 3 refills | Status: DC
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
## 2021-10-20 ENCOUNTER — Telehealth (HOSPITAL_COMMUNITY): Payer: No Payment, Other | Admitting: Student in an Organized Health Care Education/Training Program

## 2021-12-07 ENCOUNTER — Telehealth (HOSPITAL_COMMUNITY): Payer: No Payment, Other | Admitting: Student in an Organized Health Care Education/Training Program

## 2021-12-14 ENCOUNTER — Telehealth (INDEPENDENT_AMBULATORY_CARE_PROVIDER_SITE_OTHER): Payer: No Payment, Other | Admitting: Student in an Organized Health Care Education/Training Program

## 2021-12-14 ENCOUNTER — Encounter (HOSPITAL_COMMUNITY): Payer: Self-pay | Admitting: Student in an Organized Health Care Education/Training Program

## 2021-12-14 DIAGNOSIS — F4312 Post-traumatic stress disorder, chronic: Secondary | ICD-10-CM

## 2021-12-14 MED ORDER — SERTRALINE HCL 25 MG PO TABS: 75.0000 mg | ORAL_TABLET | Freq: Every day | ORAL | 2 refills | Status: DC
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
## 2021-12-22 ENCOUNTER — Telehealth: Payer: Self-pay

## 2021-12-22 NOTE — Telephone Encounter (Signed)
Patient called to confirm his paperwork was indeed faxed to our office from [facility]. Confirmed with patient it was faxed to our office and he will come by to pick up this week. Nothing for provider to sign.  Eugenia Mcalpine, LPN

## 2021-12-30 ENCOUNTER — Telehealth: Payer: Self-pay

## 2021-12-30 NOTE — Telephone Encounter (Signed)
Patient called stating that he has swollen gums and a toothache - requesting a prescription for amoxicillin. I advised patient he would need to be evaluated by a provider to determine if he needs amoxicillin. I advised him we don't have any availability in our office and that he would need to go to [facility].   [place], CMA

## 2022-01-24 ENCOUNTER — Other Ambulatory Visit: Payer: Self-pay | Admitting: Infectious Diseases

## 2022-01-24 DIAGNOSIS — I10 Essential (primary) hypertension: Secondary | ICD-10-CM

## 2022-01-24 NOTE — Telephone Encounter (Signed)
Okay to refill? 

## 2022-01-25 ENCOUNTER — Telehealth (HOSPITAL_COMMUNITY): Payer: Self-pay

## 2022-01-26 ENCOUNTER — Other Ambulatory Visit (HOSPITAL_COMMUNITY): Payer: Self-pay | Admitting: Student in an Organized Health Care Education/Training Program

## 2022-01-26 DIAGNOSIS — F4312 Post-traumatic stress disorder, chronic: Secondary | ICD-10-CM

## 2022-01-26 MED ORDER — SERTRALINE HCL 25 MG PO TABS: 75.0000 mg | ORAL_TABLET | Freq: Every day | ORAL | 2 refills | Status: DC

## 2022-01-26 NOTE — Telephone Encounter (Signed)
Done

## 2022-03-07 ENCOUNTER — Ambulatory Visit (INDEPENDENT_AMBULATORY_CARE_PROVIDER_SITE_OTHER): Payer: Self-pay | Admitting: Infectious Diseases

## 2022-03-07 ENCOUNTER — Encounter: Payer: Self-pay | Admitting: Infectious Diseases

## 2022-03-07 ENCOUNTER — Other Ambulatory Visit: Payer: Self-pay

## 2022-03-07 VITALS — BP 161/104 | HR 73 | Temp 98.5°F | Ht 70.0 in | Wt 215.0 lb

## 2022-03-07 DIAGNOSIS — Z79899 Other long term (current) drug therapy: Secondary | ICD-10-CM

## 2022-03-07 DIAGNOSIS — Z21 Asymptomatic human immunodeficiency virus [HIV] infection status: Secondary | ICD-10-CM

## 2022-03-07 DIAGNOSIS — I10 Essential (primary) hypertension: Secondary | ICD-10-CM

## 2022-03-07 DIAGNOSIS — N529 Male erectile dysfunction, unspecified: Secondary | ICD-10-CM

## 2022-03-07 DIAGNOSIS — B20 Human immunodeficiency virus [HIV] disease: Secondary | ICD-10-CM

## 2022-03-07 DIAGNOSIS — Z113 Encounter for screening for infections with a predominantly sexual mode of transmission: Secondary | ICD-10-CM

## 2022-03-07 DIAGNOSIS — F419 Anxiety disorder, unspecified: Secondary | ICD-10-CM

## 2022-03-07 DIAGNOSIS — A601 Herpesviral infection of perianal skin and rectum: Secondary | ICD-10-CM | POA: Diagnosis not present

## 2022-03-07 DIAGNOSIS — Z8619 Personal history of other infectious and parasitic diseases: Secondary | ICD-10-CM

## 2022-03-07 DIAGNOSIS — F32A Depression, unspecified: Secondary | ICD-10-CM

## 2022-03-07 NOTE — Progress Notes (Signed)
Name: Max Ray  DOB: [birth year] MRN: [id number] PCP: Pcp, No    Brief Narrative:  Max Ray is a 61 y.o. male with well controlled HIV.  CD4 nadir unknown  HIV Risk: MSM/Bisexual History of OIs: None known Intake Labs: 2013 Hep B sAg (-), sAb (+); Hep A (vax 2014), Hep C (-) Quantiferon (-) HLA B*5701 ([) G6PD: ()   Previous Regimens: Complera Odefsey >> undetectable   Genotypes: None on file   Subjective:   Chief Complaint  Patient presents with   Follow-up    Itching rash on inner thighs x 2 weeks       HPI: Max Ray is here for follow-up HIV care.  He is doing well on his medication Odefsey and continues taking this once daily without any significant lapses.  No concerns or side effects.  No new medications to declare.  Concern today is a itchy/painful rash on the inner thigh near the perineum that has been present about 2 weeks now.  Largely he describes it is getting better but when it first came up it was quite irritating.  He does think he has had this rash in the past.  Reports feeling some bumps on the perineum but not quite sure what it looks like.  He is a bit frustrated today over some social/relationship challenges he tells me about.  Review of Systems  Skin:  Positive for itching and rash.  All other systems reviewed and are negative.   Past Medical History:  Diagnosis Date   Anxiety    HIV disease ([place])    Hypertension    Pneumonia    Syphilis     Outpatient Medications Prior to Visit  Medication Sig Dispense Refill   emtricitabine-rilpivir-tenofovir AF (ODEFSEY) 200-25-25 MG TABS tablet Take 1 tablet by mouth daily. 30 tablet 11   Magnesium 400 MG CAPS Take 1 capsule by mouth daily.     sertraline (ZOLOFT) 25 MG tablet Take 3 tablets (75 mg total) by mouth daily. 90 tablet 2   TURMERIC PO Take by mouth.     sildenafil (VIAGRA) 100 MG tablet Take 1 tablet (100 mg total) by mouth daily as needed for erectile dysfunction. 30 tablet  5   valsartan (DIOVAN) 80 MG tablet TAKE 1 TABLET(80 MG) BY MOUTH DAILY 30 tablet 5   ASHWAGANDHA PO Take by mouth. (Patient not taking: Reported on 03/07/2022)     fluticasone (FLONASE) 50 MCG/ACT nasal spray SHAKE LIQUID AND USE 1 SPRAY IN EACH NOSTRIL DAILY (Patient not taking: Reported on 03/07/2022) 16 g 11   Loratadine (CLARITIN PO) Take 10 mg by mouth daily. Equate brand (Patient not taking: Reported on 03/07/2022)     Misc Natural Products (IMMUNE TRIO PO) Take 1 tablet by mouth. (Patient not taking: Reported on 03/07/2022)     Misc Natural Products (OSTEO BI-FLEX ADV JOINT SHIELD) TABS Take 1 tablet by mouth. (Patient not taking: Reported on 03/07/2022)     Misc Natural Products (PROSTATE HEALTH) CAPS Take 1 capsule by mouth daily. (Patient not taking: Reported on 03/07/2022)     Multiple Vitamin (MULTIVITAMIN) tablet Take 1 tablet by mouth daily. Men +50 Centrum (Patient not taking: Reported on 03/07/2022)     penicillin g benzathine (BICILLIN LA) 1200000 UNIT/2ML injection 2.4 Million Units      No facility-administered medications prior to visit.     Allergies  Allergen Reactions   Sulfamethoxazole-Trimethoprim Other (See Comments)    Unknown allergic reaction   Sulfonamide Derivatives  Social History   Tobacco Use   Smoking status: Never   Smokeless tobacco: Never  Substance Use Topics   Alcohol use: Not Currently    Alcohol/week: 1.0 standard drink of alcohol    Types: 1 Standard drinks or equivalent per week    Comment: social   Drug use: No     Social History   Substance and Sexual Activity  Sexual Activity Yes   Partners: Male   Birth control/protection: Condom   Comment: DECLINED CONDOMS     Objective:   Vitals:   03/07/22 1545 03/07/22 1640  BP: (!) 150/106 (!) 161/104  Pulse: 73   Temp: 98.5 F (36.9 C)   TempSrc: Temporal   SpO2: 95%   Weight: 215 lb (97.5 kg)   Height: '5\' 10"'$  (1.778 m)    Body mass index is 30.85 kg/m.   Physical Exam HENT:      Mouth/Throat:     Mouth: No oral lesions.     Dentition: Normal dentition. No dental caries.  Eyes:     General: No scleral icterus. Cardiovascular:     Rate and Rhythm: Normal rate and regular rhythm.     Heart sounds: Normal heart sounds.  Pulmonary:     Effort: Pulmonary effort is normal.     Breath sounds: Normal breath sounds.  Abdominal:     General: There is no distension.     Palpations: Abdomen is soft.     Tenderness: There is no abdominal tenderness.  Lymphadenopathy:     Cervical: No cervical adenopathy.  Skin:    General: Skin is warm and dry.     Findings: No rash.  Neurological:     Mental Status: He is alert and oriented to person, place, and time.     Lab Results Lab Results  Component Value Date   WBC 8.9 03/07/2022   HGB 16.1 03/07/2022   HCT 45.7 03/07/2022   MCV 94.0 03/07/2022   PLT 295 03/07/2022    Lab Results  Component Value Date   CREATININE 1.03 03/07/2022   BUN 13 03/07/2022   NA 137 03/07/2022   K 4.5 03/07/2022   CL 101 03/07/2022   CO2 27 03/07/2022    Lab Results  Component Value Date   ALT 14 03/07/2022   AST 18 03/07/2022   ALKPHOS 69 04/27/2016   BILITOT 0.5 03/07/2022    Lab Results  Component Value Date   CHOL 194 03/07/2022   HDL 64 03/07/2022   LDLCALC 92 03/07/2022   TRIG 264 (H) 03/07/2022   CHOLHDL 3.0 03/07/2022   HIV 1 RNA Quant (Copies/mL)  Date Value  03/07/2022 Not Detected  08/09/2021 <20 (H)  03/09/2021 Not Detected   CD4 T Cell Abs (/uL)  Date Value  03/07/2022 877  08/09/2021 675  06/30/2020 1,413     Assessment & Plan:   Patient Active Problem List   Diagnosis Date Noted   Prostate hypertrophy 04/27/2016   IBS (irritable bowel syndrome) 04/08/2014   Erectile dysfunction 03/15/2012   Renal insufficiency 06/22/2011   TIA (transient ischemic attack) 03/22/2011   Hypertension 03/22/2011   PTSD (post-traumatic stress disorder) 03/22/2011   Hypogonadism male 03/22/2011   Macrocytic  anemia 03/22/2011   Anxiety and depression 03/22/2011   History of syphilis 03/09/2007   GLUCOSE-6-PHOSPHATE DEHYDROGENASE DEFICIENCY 03/09/2007   Allergic sinusitis 01/24/2007   PRESBYOPIA 06/19/2006   Human immunodeficiency virus (HIV) disease ([place]) 10/04/2005   Genital herpes 10/04/2005     Problem List  Items Addressed This Visit       Unprioritized   Human immunodeficiency virus (HIV) disease ([place]) (Chronic)    Max Ray is doing very well on once daily Odefsey.  He continues to take this correctly.  Will update pertinent blood work today.  Unfortunately we did not have time to discuss any health maintenance items at this visit, he will need an anal Pap collected looking for anorectal dysplasia as well as conversation over starting a statin therapy to reduce risk for cardiovascular disease in this gentleman with HIV.  Will have him back in 4-6 months to discuss further.       Genital herpes    His rash is isolated to the perineum with various stages of well-circumscribed annular lesions with with new skin growth over top.  None of the lesions appear erythematous.  Rash is most consistent with previous herpetic outbreak.  We discussed this today and he has had this problem for a few years now off-and-on.  We discussed episodic treatment with Valtrex.  I asked him to please contact us at the initial onset of the rash his next episode so we can prescribe him antiviral therapy with Valtrex. Counseled on transmission with herpes to partners.  If his flares are quite frequently, which they do not sound like they are, he can have suppressive therapy in an effort to decrease shedding and spread to other sexual partners.      History of syphilis    Rescreen with titer today and urine/extragenital STI testing.  Treatment as indicated based on results.      Hypertension    BP Readings from Last 3 Encounters:  03/07/22 (!) 161/104  08/09/21 (!) 148/91  03/09/21 (!) 152/72  I think organ to  need to make some changes to his blood pressure medicine to get him into better control.  He was pretty anxious/keyed up today on exam.   Once his lab work returns we will call him to increase valsartan to 160 mg daily and have him return in 2 months to recheck blood pressure for him.      Relevant Medications   sildenafil (VIAGRA) 100 MG tablet   valsartan (DIOVAN) 160 MG tablet   Anxiety and depression    Continues to work with psychiatry and managed by their team.      Erectile dysfunction    Sildenafil is effective at the current dose for him.  Refills have been sent in.      Relevant Medications   sildenafil (VIAGRA) 100 MG tablet   Other Visit Diagnoses     Asymptomatic HIV infection, with no history of HIV-related illness ([place])    -  Primary   Relevant Orders   CBC with Differential/Platelet (Completed)   COMPLETE METABOLIC PANEL WITH GFR (Completed)   HIV-1 RNA quant-no reflex-bld (Completed)   Lipid panel (Completed)   RPR (Completed)   T-helper cell (CD4)- ([facility] clinic only) (Completed)   Urine cytology ancillary only (Completed)   Routine screening for STI (sexually transmitted infection)       Relevant Orders   RPR (Completed)   Urine cytology ancillary only (Completed)   Cytology (oral, anal, urethral) ancillary only (Completed)   Cytology (oral, anal, urethral) ancillary only (Completed)   Polypharmacy       Relevant Orders   Lipid panel (Completed)   Essential hypertension       Relevant Medications   sildenafil (VIAGRA) 100 MG tablet   valsartan (DIOVAN) 160 MG tablet  Janene Madeira, MSN, NP-C [facility] [place] Pager: [phone] Office: [phone]  03/16/22  11:23 AM

## 2022-03-08 LAB — T-HELPER CELL (CD4) - (RCID CLINIC ONLY)
CD4 % Helper T Cell: 37 % (ref 33–65)
CD4 T Cell Abs: 877 /uL (ref 400–1790)

## 2022-03-09 LAB — URINE CYTOLOGY ANCILLARY ONLY
Chlamydia: NEGATIVE
Comment: NEGATIVE
Comment: NORMAL
Neisseria Gonorrhea: NEGATIVE

## 2022-03-09 LAB — CYTOLOGY, (ORAL, ANAL, URETHRAL) ANCILLARY ONLY
Chlamydia: NEGATIVE
Comment: NEGATIVE
Comment: NORMAL
Comment: NEGATIVE
Comment: NORMAL
Neisseria Gonorrhea: NEGATIVE

## 2022-03-10 LAB — CBC WITH DIFFERENTIAL/PLATELET
Absolute Monocytes: 623 cells/uL (ref 200–950)
Basophils Absolute: 53 cells/uL (ref 0–200)
Basophils Relative: 0.6 %
Eosinophils Absolute: 142 cells/uL (ref 15–500)
Eosinophils Relative: 1.6 %
HCT: 45.7 % (ref 38.5–50.0)
Hemoglobin: 16.1 g/dL (ref 13.2–17.1)
Lymphs Abs: 2706 cells/uL (ref 850–3900)
MCH: 33.1 pg — ABNORMAL HIGH (ref 27.0–33.0)
MCHC: 35.2 g/dL (ref 32.0–36.0)
MCV: 94 fL (ref 80.0–100.0)
MPV: 9.9 fL (ref 7.5–12.5)
Monocytes Relative: 7 %
Neutro Abs: 5376 cells/uL (ref 1500–7800)
Neutrophils Relative %: 60.4 %
Platelets: 295 10*3/uL (ref 140–400)
RBC: 4.86 10*6/uL (ref 4.20–5.80)
RDW: 11.8 % (ref 11.0–15.0)
Total Lymphocyte: 30.4 %
WBC: 8.9 10*3/uL (ref 3.8–10.8)

## 2022-03-10 LAB — LIPID PANEL
Cholesterol: 194 mg/dL (ref <200)
HDL: 64 mg/dL (ref >=40)
LDL Cholesterol (Calc): 92 mg/dL (calc)
Non-HDL Cholesterol (Calc): 130 mg/dL (calc) — ABNORMAL HIGH (ref <130)
Total CHOL/HDL Ratio: 3 (calc) (ref <5.0)
Triglycerides: 264 mg/dL — ABNORMAL HIGH (ref <150)

## 2022-03-10 LAB — COMPLETE METABOLIC PANEL WITH GFR
AG Ratio: 1.4 (calc) (ref 1.0–2.5)
ALT: 14 U/L (ref 9–46)
AST: 18 U/L (ref 10–35)
Albumin: 4.9 g/dL (ref 3.6–5.1)
Alkaline phosphatase (APISO): 71 U/L (ref 35–144)
BUN: 13 mg/dL (ref 7–25)
CO2: 27 mmol/L (ref 20–32)
Calcium: 9.9 mg/dL (ref 8.6–10.3)
Chloride: 101 mmol/L (ref 98–110)
Creat: 1.03 mg/dL (ref 0.70–1.35)
Globulin: 3.4 g/dL (calc) (ref 1.9–3.7)
Glucose, Bld: 100 mg/dL — ABNORMAL HIGH (ref 65–99)
Potassium: 4.5 mmol/L (ref 3.5–5.3)
Sodium: 137 mmol/L (ref 135–146)
Total Bilirubin: 0.5 mg/dL (ref 0.2–1.2)
Total Protein: 8.3 g/dL — ABNORMAL HIGH (ref 6.1–8.1)
eGFR: 83 mL/min/{1.73_m2} (ref >=60)

## 2022-03-10 LAB — T PALLIDUM AB: T Pallidum Abs: POSITIVE — AB

## 2022-03-10 LAB — HIV-1 RNA QUANT-NO REFLEX-BLD
HIV 1 RNA Quant: NOT DETECTED Copies/mL
HIV-1 RNA Quant, Log: NOT DETECTED Log cps/mL

## 2022-03-10 LAB — RPR: RPR Ser Ql: REACTIVE — AB

## 2022-03-10 LAB — RPR TITER: RPR Titer: 1:8 {titer} — ABNORMAL HIGH

## 2022-03-14 ENCOUNTER — Telehealth: Payer: Self-pay

## 2022-03-14 NOTE — Telephone Encounter (Signed)
Patient made aware of lab results. HE wishes to discuss adding cholesterol medication at his next visit. This information will be added to appointment notes.   Eugenia Mcalpine, LPN

## 2022-03-14 NOTE — Telephone Encounter (Signed)
-----   Message from Frank[place] Callas, NP sent at 03/11/2022  2:12 PM EST ----- Please call Max Ray to let him know that his labs look great - no new syphilis infection, all bacterial STI swabs are negative, his viral load is undetectable and his CD4 remains very good @ 877.  He would benefit from adding a cholesterol medication once a day to help prevent heart disease based on REPRIEVE data. If he prefer to talk more about this at the next visit we can certainly wait to consider adding it.   Otherwsie his kidney and liver function tests are normal and no concern for diabetes from me.

## 2022-03-16 ENCOUNTER — Telehealth: Payer: Self-pay

## 2022-03-16 MED ORDER — SILDENAFIL CITRATE 100 MG PO TABS: 100.0000 mg | ORAL_TABLET | Freq: Every day | ORAL | 5 refills | Status: DC | PRN

## 2022-03-16 MED ORDER — VALSARTAN 160 MG PO TABS: 160.0000 mg | ORAL_TABLET | Freq: Every day | ORAL | 2 refills | Status: DC

## 2022-03-16 NOTE — Assessment & Plan Note (Signed)
Sildenafil is effective at the current dose for him.  Refills have been sent in.

## 2022-03-16 NOTE — Assessment & Plan Note (Signed)
Rescreen with titer today and urine/extragenital STI testing.  Treatment as indicated based on results.

## 2022-03-16 NOTE — Addendum Note (Signed)
Addended by: Philomath Callas on: 03/16/2022 11:24 AM   Modules accepted: Level of Service

## 2022-03-16 NOTE — Telephone Encounter (Signed)
-----   Message from Laurel Callas, NP sent at 03/16/2022 11:24 AM EDT ----- Can someone please give Tia a call and let him know I would like to increase his blood pressure medicine to 160 mg a day.  He can take 2 pills from his current bottle of valsartan but I will send in a new prescription for him with a higher dose.  All of his lab work look very good, viral load was undetectable CD4 count looks great all STI screening labs were negative.  Can we ask if he would be willing to come back in 2 months or so to recheck his blood pressure and discuss other health maintenance items that we did not get a chance to talk about this recent visit I also refilled his sildenafil  Thank you!

## 2022-03-16 NOTE — Assessment & Plan Note (Signed)
Continues to work with psychiatry and managed by their team.

## 2022-03-16 NOTE — Telephone Encounter (Signed)
Spoke with and informed him about blood pressure dose change. Did not have any questions at this time. Is scheduled to follow up in May. Leatrice Jewels, RMA

## 2022-03-16 NOTE — Assessment & Plan Note (Signed)
His rash is isolated to the perineum with various stages of well-circumscribed annular lesions with with new skin growth over top.  None of the lesions appear erythematous.  Rash is most consistent with previous herpetic outbreak.  We discussed this today and he has had this problem for a few years now off-and-on.  We discussed episodic treatment with Valtrex.  I asked him to please contact us at the initial onset of the rash his next episode so we can prescribe him antiviral therapy with Valtrex. Counseled on transmission with herpes to partners.  If his flares are quite frequently, which they do not sound like they are, he can have suppressive therapy in an effort to decrease shedding and spread to other sexual partners.

## 2022-03-16 NOTE — Assessment & Plan Note (Signed)
Jackob is doing very well on once daily Odefsey.  He continues to take this correctly.  Will update pertinent blood work today.  Unfortunately we did not have time to discuss any health maintenance items at this visit, he will need an anal Pap collected looking for anorectal dysplasia as well as conversation over starting a statin therapy to reduce risk for cardiovascular disease in this gentleman with HIV.  Will have him back in 4-6 months to discuss further.

## 2022-03-16 NOTE — Assessment & Plan Note (Signed)
BP Readings from Last 3 Encounters:  03/07/22 (!) 161/104  08/09/21 (!) 148/91  03/09/21 (!) 152/72   I think organ to need to make some changes to his blood pressure medicine to get him into better control.  He was pretty anxious/keyed up today on exam.   Once his lab work returns we will call him to increase valsartan to 160 mg daily and have him return in 2 months to recheck blood pressure for him.

## 2022-03-29 ENCOUNTER — Telehealth: Payer: Self-pay

## 2022-03-29 NOTE — Telephone Encounter (Signed)
Glad he called - and yes I think it is jock itch based on what he showed me and described last time.   I want him to pick up over the counter clotrimazole 1% cream - this is an antifungal that is commonly advertised as "athletes foot" cream.  Start using that twice a day for 3 weeks.   To try to prevent this in the future, need to reduce skin moisture in the skin folds - Goldbond Powder or drying lotions over the counter can help. Also would try to use cotton underwear, well fitting / not overly tight to help decrease moisture/friction.

## 2022-03-29 NOTE — Telephone Encounter (Signed)
Patient calling stating the rash on his testicles is getting worse. He states he discussed this at his last appointment with you and was informed it was possible jock itch. He reports peeling of the skin the with the rash.  Please advise .Cressie Betzler T Brooks Sailors

## 2022-03-29 NOTE — Telephone Encounter (Signed)
Patient advised what to purchase OTC how to use it along with additional instructions for the future to prevent this. Patient verbalized understandings.  Max Ray

## 2022-04-13 ENCOUNTER — Other Ambulatory Visit: Payer: Self-pay | Admitting: Psychiatry

## 2022-05-12 ENCOUNTER — Other Ambulatory Visit: Payer: Self-pay | Admitting: Infectious Diseases

## 2022-05-12 ENCOUNTER — Telehealth: Payer: Self-pay

## 2022-05-12 DIAGNOSIS — I10 Essential (primary) hypertension: Secondary | ICD-10-CM

## 2022-05-12 MED ORDER — VALSARTAN 160 MG PO TABS: 160.0000 mg | ORAL_TABLET | Freq: Every day | ORAL | 0 refills | Status: DC

## 2022-05-12 NOTE — Telephone Encounter (Signed)
Received refill request for patient's Valsartan 80 mg tablets. Per chart review, Shermaine was changed to 160 mg tablets.   It appears the new dose has not been dispensed. Changed Rx from [facility] pharmacy to [facility] due to ADAP coverage.   Called Gearl to assess if he has been taking 80 mg or 160 mg dose, no answer. Left HIPAA compliant voicemail requesting callback.   Sandie Ano, RN

## 2022-05-12 NOTE — Telephone Encounter (Signed)
Huber called back, says he has been taking two 80 mg tablets for the last couple of months. Explained that new prescription has been sent in for 160 mg tablet and that he will only need to take 1 tablet daily with this new formulation. Reminded him of upcoming appointment later this month. Patient verbalized understanding and has no further questions.   Sandie Ano, RN

## 2022-05-31 ENCOUNTER — Other Ambulatory Visit: Payer: Self-pay

## 2022-05-31 ENCOUNTER — Encounter: Payer: Self-pay | Admitting: Infectious Diseases

## 2022-05-31 ENCOUNTER — Ambulatory Visit (INDEPENDENT_AMBULATORY_CARE_PROVIDER_SITE_OTHER): Payer: Self-pay | Admitting: Infectious Diseases

## 2022-05-31 VITALS — BP 157/101 | HR 76 | Temp 98.4°F | Ht 70.0 in | Wt 220.0 lb

## 2022-05-31 DIAGNOSIS — N529 Male erectile dysfunction, unspecified: Secondary | ICD-10-CM

## 2022-05-31 DIAGNOSIS — I10 Essential (primary) hypertension: Secondary | ICD-10-CM

## 2022-05-31 DIAGNOSIS — B20 Human immunodeficiency virus [HIV] disease: Secondary | ICD-10-CM

## 2022-05-31 MED ORDER — PITAVASTATIN CALCIUM 4 MG PO TABS
4.0000 mg | ORAL_TABLET | Freq: Every day | ORAL | 11 refills | Status: DC
Start: 2022-05-31 — End: 2023-05-02

## 2022-05-31 NOTE — Patient Instructions (Addendum)
Separate the odefsey from the supplement by at least 10 hours apart. Don't put them in the stomach at the same time.   The goal for your blood pressure -  < 130/80  Message me after you get about a month of measurements to see if we need to adjust your blood pressure medication dose.  Get an automated blood pressure cuff that goes on your arm.   START the pitavastatin- one pill once a day with you odefsey to help with heart disease risk.

## 2022-05-31 NOTE — Progress Notes (Unsigned)
Name: Max Ray  DOB: 1960/09/03 MRN: [id number] PCP: Pcp, No    Brief Narrative:  Max Ray is a 62 y.o. male with well controlled HIV.  CD4 nadir unknown  HIV Risk: MSM/Bisexual History of OIs: None known Intake Labs: 2013 Hep B sAg (-), sAb (+); Hep A (vax 2014), Hep C (-) Quantiferon (-) HLA B*5701 ([) G6PD: ()   Previous Regimens: Complera Odefsey >> undetectable   Genotypes: None on file   Subjective:   Chief Complaint  Patient presents with   Follow-up     HPI: Max Ray is here for follow-up HIV care.  Max Ray is doing well on Max Ray medication Odefsey and continues taking this once daily with food without any significant lapses.  Max Ray states that Max Ray misses up to 2 doses per month because Max Ray does not have enough food at the time of taking Max Ray medicine.  Max Ray continues on Max Ray valsartan once daily for blood pressure management.  On a new supplement called Nitric Oxide for a month now - has noticed favorable effect with Max Ray ED and feels it is helping out. Started on vitamin D3/K2.  Max Ray does not smoke cigarettes.  Max Ray does have some history of cardiac events in Max Ray family.   Review of Systems  All other systems reviewed and are negative.   Past Medical History:  Diagnosis Date   Anxiety    HIV disease (HCC)    Hypertension    Pneumonia    Syphilis     Outpatient Medications Prior to Visit  Medication Sig Dispense Refill   emtricitabine-rilpivir-tenofovir AF (ODEFSEY) 200-25-25 MG TABS tablet Take 1 tablet by mouth daily. 30 tablet 11   fluticasone (FLONASE) 50 MCG/ACT nasal spray SHAKE LIQUID AND USE 1 SPRAY IN EACH NOSTRIL DAILY 16 g 11   Loratadine (CLARITIN PO) Take 10 mg by mouth daily. Equate brand     Magnesium 400 MG CAPS Take 1 capsule by mouth daily.     Misc Natural Products (IMMUNE TRIO PO) Take 1 tablet by mouth.     Misc Natural Products (OSTEO BI-FLEX ADV JOINT SHIELD) TABS Take 1 tablet by mouth.     Misc Natural Products (PROSTATE HEALTH) CAPS  Take 1 capsule by mouth daily.     Multiple Vitamin (MULTIVITAMIN) tablet Take 1 tablet by mouth daily. Men +50 Centrum     NON FORMULARY Nitric oxide - 2 capsules a day     sertraline (ZOLOFT) 25 MG tablet Take 3 tablets (75 mg total) by mouth daily. 90 tablet 2   sildenafil (VIAGRA) 100 MG tablet Take 1 tablet (100 mg total) by mouth daily as needed for erectile dysfunction. 30 tablet 5   TURMERIC PO Take by mouth.     valsartan (DIOVAN) 160 MG tablet Take 1 tablet (160 mg total) by mouth daily. 30 tablet 0   ASHWAGANDHA PO Take by mouth. (Patient not taking: Reported on 03/07/2022)     No facility-administered medications prior to visit.     Allergies  Allergen Reactions   Sulfamethoxazole-Trimethoprim Other (See Comments)    Unknown allergic reaction   Sulfonamide Derivatives     Social History   Tobacco Use   Smoking status: Never   Smokeless tobacco: Never  Substance Use Topics   Alcohol use: Not Currently    Alcohol/week: 1.0 standard drink of alcohol    Types: 1 Standard drinks or equivalent per week    Comment: social   Drug use: No     Social  History   Substance and Sexual Activity  Sexual Activity Yes   Partners: Male   Birth control/protection: Condom   Comment: DECLINED CONDOMS     Objective:   Vitals:   05/31/22 1543 05/31/22 1625  BP: (!) 146/98 (!) 157/101  Pulse: 76   Temp: 98.4 F (36.9 C)   TempSrc: Temporal   SpO2: 97%   Weight: 220 lb (99.8 kg)   Height: 5\' 10"  (1.778 m)    Body mass index is 31.57 kg/m.   Physical Exam HENT:     Mouth/Throat:     Mouth: No oral lesions.     Dentition: Normal dentition. No dental caries.  Eyes:     General: No scleral icterus. Cardiovascular:     Rate and Rhythm: Normal rate and regular rhythm.     Heart sounds: Normal heart sounds.  Pulmonary:     Effort: Pulmonary effort is normal.     Breath sounds: Normal breath sounds.  Abdominal:     General: There is no distension.     Palpations:  Abdomen is soft.     Tenderness: There is no abdominal tenderness.  Lymphadenopathy:     Cervical: No cervical adenopathy.  Skin:    General: Skin is warm and dry.     Findings: No rash.  Neurological:     Mental Status: Max Ray is alert and oriented to person, place, and time.     Lab Results Lab Results  Component Value Date   WBC 8.9 03/07/2022   HGB 16.1 03/07/2022   HCT 45.7 03/07/2022   MCV 94.0 03/07/2022   PLT 295 03/07/2022    Lab Results  Component Value Date   CREATININE 1.03 03/07/2022   BUN 13 03/07/2022   NA 137 03/07/2022   K 4.5 03/07/2022   CL 101 03/07/2022   CO2 27 03/07/2022    Lab Results  Component Value Date   ALT 14 03/07/2022   AST 18 03/07/2022   ALKPHOS 69 04/27/2016   BILITOT 0.5 03/07/2022    Lab Results  Component Value Date   CHOL 194 03/07/2022   HDL 64 03/07/2022   LDLCALC 92 03/07/2022   TRIG 264 (H) 03/07/2022   CHOLHDL 3.0 03/07/2022   HIV 1 RNA Quant (Copies/mL)  Date Value  03/07/2022 Not Detected  08/09/2021 <20 (H)  03/09/2021 Not Detected   CD4 T Cell Abs (/uL)  Date Value  03/07/2022 877  08/09/2021 675  06/30/2020 1,413     Assessment & Plan:   Patient Active Problem List   Diagnosis Date Noted   Prostate hypertrophy 04/27/2016   IBS (irritable bowel syndrome) 04/08/2014   Erectile dysfunction 03/15/2012   Renal insufficiency 06/22/2011   TIA (transient ischemic attack) 03/22/2011   Hypertension 03/22/2011   PTSD (post-traumatic stress disorder) 03/22/2011   Hypogonadism male 03/22/2011   Macrocytic anemia 03/22/2011   Anxiety and depression 03/22/2011   History of syphilis 03/09/2007   GLUCOSE-6-PHOSPHATE DEHYDROGENASE DEFICIENCY 03/09/2007   Allergic sinusitis 01/24/2007   PRESBYOPIA 06/19/2006   Human immunodeficiency virus (HIV) disease (HCC) 10/04/2005   Genital herpes 10/04/2005     Problem List Items Addressed This Visit       Unprioritized   Human immunodeficiency virus (HIV) disease  (HCC) - Primary (Chronic)    Perfectly controlled on Odefsey once daily.  We discussed supplements and interaction potential -I like for Max Ray to space out by 10 to 12 hours between doses if Max Ray finds some of these beneficial.  Max Ray  is also taking intermittent Tums which I advised not to take [facility] fact the absorption.  Max Ray is having some p.m. abdominal bloating and gas.  We discussed some different options that may be helpful to identify a pattern of triggers for Max Ray to avoid. Max Ray immunizations are up-to-date.    With new research coming out by way of the REPRIEVE study regarding cardiovascular event risk reduction for PLWH, I discussed that over the 8 year trial initiation of statin (pitavastatin) therapy was shown to reduce CV disease by 35% independent of other risk factors.    The ASCVD Risk score (Arnett DK, et al., 2019) failed to calculate for the following reasons:   The patient has a prior MI or stroke diagnosis   Will proceed with pitavastatin 4 mg once daily.  Will update diabetes screen next lab draw when Max Ray returns.  Max Ray will also need discussion regarding anal cancer screening at next office visit.  Return in about 3 months (around 08/31/2022).         Hypertension    BP Readings from Last 3 Encounters:  05/31/22 (!) 157/101  03/07/22 (!) 161/104  08/09/21 (!) 148/91  Blood pressures have been above target.  We spent time discussing having a home automated blood pressure cuff for the arm.  Max Ray will pick one up and start measuring values twice a week.  If Max Ray finds that Max Ray is persistently over 130/80 next step will be to will increase valsartan vs adding amlodipine once daily.   Max Ray would benefit from internal medicine given history of TIA -Max Ray is hopeful to have new insurance available to Max Ray soon so Max Ray can afford visits.  Although [facility] explained [facility] program for modified payments for self-pay individuals.  Max Ray will consider       Relevant Medications   Pitavastatin Calcium 4 MG TABS   Erectile dysfunction    Sildenafil is effective at the current dose, Max Ray has refills available to Max Ray      Other Visit Diagnoses     Essential hypertension       Relevant Medications   Pitavastatin Calcium 4 MG TABS   Other Relevant Orders   HIV 1 RNA quant-no reflex-bld      Rexene Alberts, MSN, NP-C [facility] Pager: [phone] Office: [phone]  06/01/22  11:07 AM

## 2022-06-01 NOTE — Assessment & Plan Note (Signed)
Sildenafil is effective at the current dose, he has refills available to him

## 2022-06-01 NOTE — Assessment & Plan Note (Signed)
Perfectly controlled on Odefsey once daily.  We discussed supplements and interaction potential -I like for him to space out by 10 to 12 hours between doses if he finds some of these beneficial.  He is also taking intermittent Tums which I advised not to take [facility] fact the absorption.  He is having some p.m. abdominal bloating and gas.  We discussed some different options that may be helpful to identify a pattern of triggers for him to avoid. His immunizations are up-to-date.    With new research coming out by way of the REPRIEVE study regarding cardiovascular event risk reduction for PLWH, I discussed that over the 8 year trial initiation of statin (pitavastatin) therapy was shown to reduce CV disease by 35% independent of other risk factors.    The ASCVD Risk score (Arnett DK, et al., 2019) failed to calculate for the following reasons:   The patient has a prior MI or stroke diagnosis   Will proceed with pitavastatin 4 mg once daily.  Will update diabetes screen next lab draw when he returns.  He will also need discussion regarding anal cancer screening at next office visit.  Return in about 3 months (around 08/31/2022).

## 2022-06-01 NOTE — Assessment & Plan Note (Addendum)
BP Readings from Last 3 Encounters:  05/31/22 (!) 157/101  03/07/22 (!) 161/104  08/09/21 (!) 148/91   Blood pressures have been above target.  We spent time discussing having a home automated blood pressure cuff for the arm.  He will pick one up and start measuring values twice a week.  If he finds that he is persistently over 130/80 next step will be to will increase valsartan vs adding amlodipine once daily.   He would benefit from internal medicine given history of TIA -he is hopeful to have new insurance available to him soon so he can afford visits.  Although I explained that [facility] have a program for modified payments for self-pay individuals.  He will consider

## 2022-06-03 LAB — HIV-1 RNA QUANT-NO REFLEX-BLD
HIV 1 RNA Quant: <20 Copies/mL — ABNORMAL HIGH
HIV-1 RNA Quant, Log: <1.3 Log cps/mL — ABNORMAL HIGH

## 2022-06-13 ENCOUNTER — Telehealth: Payer: Self-pay

## 2022-06-13 NOTE — Telephone Encounter (Signed)
Patient aware and voiced his understanding.   Khloei Spiker P Rudi Knippenberg, CMA  

## 2022-06-13 NOTE — Telephone Encounter (Signed)
-----   Message from Blanchard Kelch, NP sent at 06/13/2022  3:15 PM EDT ----- Please call Merton to let him know his viral load remains undetectable.

## 2022-07-04 ENCOUNTER — Other Ambulatory Visit: Payer: Self-pay | Admitting: Infectious Diseases

## 2022-07-04 ENCOUNTER — Telehealth (HOSPITAL_COMMUNITY): Payer: Self-pay | Admitting: Student in an Organized Health Care Education/Training Program

## 2022-07-04 ENCOUNTER — Telehealth (HOSPITAL_COMMUNITY): Payer: Self-pay

## 2022-07-04 DIAGNOSIS — I10 Essential (primary) hypertension: Secondary | ICD-10-CM

## 2022-07-04 DIAGNOSIS — F4312 Post-traumatic stress disorder, chronic: Secondary | ICD-10-CM

## 2022-07-04 MED ORDER — SERTRALINE HCL 25 MG PO TABS: 75.0000 mg | ORAL_TABLET | Freq: Every day | ORAL | 2 refills | Status: DC

## 2022-07-04 NOTE — Telephone Encounter (Signed)
Medication management - Patient called stating he was in need of a new Sertraline order and to set up his next appointment with Dr. Morrie Sheldon, last seen 01/26/22. Agreed to send request to provider and to have patient access staff call back to schedule for first available with Dr. Morrie Sheldon.

## 2022-07-04 NOTE — Telephone Encounter (Signed)
done

## 2022-07-04 NOTE — Telephone Encounter (Signed)
Ok to refill 

## 2022-07-04 NOTE — Telephone Encounter (Addendum)
I will send in refills to get to his next appt, but please schedule patient with either Dr. Jerrel Ivory or Dr. Alfonse Flavors. I am only working 1 half day each week. A lot of these patient's have missed appts and cannot be rescheduled with me.   PGY-4 Eliseo Gum, MD

## 2022-07-29 ENCOUNTER — Other Ambulatory Visit: Payer: Self-pay | Admitting: Infectious Diseases

## 2022-07-29 DIAGNOSIS — B20 Human immunodeficiency virus [HIV] disease: Secondary | ICD-10-CM

## 2022-08-12 ENCOUNTER — Telehealth (INDEPENDENT_AMBULATORY_CARE_PROVIDER_SITE_OTHER): Payer: No Payment, Other | Admitting: Student in an Organized Health Care Education/Training Program

## 2022-08-12 ENCOUNTER — Encounter (HOSPITAL_COMMUNITY): Payer: Self-pay | Admitting: Student in an Organized Health Care Education/Training Program

## 2022-08-12 DIAGNOSIS — F4312 Post-traumatic stress disorder, chronic: Secondary | ICD-10-CM | POA: Diagnosis not present

## 2022-08-12 MED ORDER — SERTRALINE HCL 100 MG PO TABS
100.0000 mg | ORAL_TABLET | Freq: Every day | ORAL | 2 refills | Status: DC
Start: 2022-08-12 — End: 2022-10-28
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
## 2022-08-29 ENCOUNTER — Ambulatory Visit: Payer: Self-pay | Admitting: Infectious Diseases

## 2022-09-15 ENCOUNTER — Other Ambulatory Visit: Payer: Self-pay | Admitting: Infectious Diseases

## 2022-09-15 DIAGNOSIS — I10 Essential (primary) hypertension: Secondary | ICD-10-CM

## 2022-09-15 NOTE — Telephone Encounter (Signed)
Okay to refill? 

## 2022-09-19 ENCOUNTER — Other Ambulatory Visit: Payer: Self-pay

## 2022-09-19 ENCOUNTER — Ambulatory Visit (INDEPENDENT_AMBULATORY_CARE_PROVIDER_SITE_OTHER): Payer: Self-pay | Admitting: Infectious Diseases

## 2022-09-19 ENCOUNTER — Encounter: Payer: Self-pay | Admitting: Infectious Diseases

## 2022-09-19 ENCOUNTER — Ambulatory Visit (INDEPENDENT_AMBULATORY_CARE_PROVIDER_SITE_OTHER): Payer: Self-pay

## 2022-09-19 VITALS — BP 149/94 | HR 88 | Resp 16 | Ht 70.0 in | Wt 214.2 lb

## 2022-09-19 DIAGNOSIS — B20 Human immunodeficiency virus [HIV] disease: Secondary | ICD-10-CM

## 2022-09-19 DIAGNOSIS — Z23 Encounter for immunization: Secondary | ICD-10-CM

## 2022-09-19 DIAGNOSIS — I10 Essential (primary) hypertension: Secondary | ICD-10-CM

## 2022-09-19 DIAGNOSIS — F419 Anxiety disorder, unspecified: Secondary | ICD-10-CM

## 2022-09-19 DIAGNOSIS — Z113 Encounter for screening for infections with a predominantly sexual mode of transmission: Secondary | ICD-10-CM

## 2022-09-19 DIAGNOSIS — Z1212 Encounter for screening for malignant neoplasm of rectum: Secondary | ICD-10-CM

## 2022-09-19 DIAGNOSIS — Z21 Asymptomatic human immunodeficiency virus [HIV] infection status: Secondary | ICD-10-CM

## 2022-09-19 DIAGNOSIS — Z8619 Personal history of other infectious and parasitic diseases: Secondary | ICD-10-CM

## 2022-09-19 DIAGNOSIS — F32A Depression, unspecified: Secondary | ICD-10-CM
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
## 2022-09-19 NOTE — Assessment & Plan Note (Signed)
No symptoms / exposures - repeat testing today.

## 2022-09-19 NOTE — Patient Instructions (Addendum)
Vitamin E oil - scar massage to the finger 2 times a day - spend a minute rubbing over the scar in different directions   I think stopping the alcohol will help with your mood, sleep and blood pressure.   Come back to see me in 3 months and we will repeat your blood pressure. May need to add another type of medication to control this for you.

## 2022-09-19 NOTE — Assessment & Plan Note (Signed)
Exacerbation in agitation / depressed mood. He has some social withdrawal but still ensures his health, animals, personal appearance and home environment are well tended to. He agreed that the alcohol was not helping him and I also highlighted this will interfere with his sleep.  He had increase in sertraline recently by his [facility] team and FU with them in October.

## 2022-09-19 NOTE — Assessment & Plan Note (Signed)
Very well controlled on once daily odefsey taken with food. No concerns with access or adherence to medication. They are tolerating the medication well without side effects. No drug interactions identified. Pertinent lab tests ordered today.  Reminded about ADAP re-enrollment in July. No dental needs today.  Increased depression in setting of stress - see problem based charting  Sexual health and family planning discussed - asymptomatic screening today.  Vaccines updated today - see health maintenance section.  Anal pap smear collected today (self collection).  Statin treatment for primary cardiac prevention.  Colon cancer screen due 2031.   Return in about 3 months (around 12/19/2022).

## 2022-09-19 NOTE — Assessment & Plan Note (Addendum)
BP Readings from Last 3 Encounters:  09/19/22 (!) 149/94  05/31/22 (!) 157/101  03/07/22 (!) 161/104   He will stop ETOH use and will see him back in 73m to repeat - if still elevated will need to combo the losartan with hydrochlorothiazide or CBB.

## 2022-09-20 LAB — CYTOLOGY, (ORAL, ANAL, URETHRAL) ANCILLARY ONLY
Chlamydia: NEGATIVE
Chlamydia: NEGATIVE
Comment: NEGATIVE
Comment: NORMAL
Comment: NEGATIVE
Comment: NORMAL
Neisseria Gonorrhea: NEGATIVE
Neisseria Gonorrhea: NEGATIVE

## 2022-09-20 LAB — URINE CYTOLOGY ANCILLARY ONLY
Chlamydia: NEGATIVE
Comment: NEGATIVE
Comment: NORMAL
Neisseria Gonorrhea: NEGATIVE

## 2022-09-20 LAB — T-HELPER CELLS (CD4) COUNT (NOT AT ARMC)
CD4 % Helper T Cell: 41 % (ref 33–65)
CD4 T Cell Abs: 851 /uL (ref 400–1790)

## 2022-09-21 LAB — RPR: RPR Ser Ql: REACTIVE — AB

## 2022-09-21 LAB — RPR TITER: RPR Titer: 1:4 {titer} — ABNORMAL HIGH

## 2022-09-26 LAB — CYTOLOGY - PAP
Adequacy: ABSENT
Diagnosis: NEGATIVE

## 2022-10-14 ENCOUNTER — Encounter (HOSPITAL_COMMUNITY): Payer: No Payment, Other | Admitting: Student in an Organized Health Care Education/Training Program

## 2022-10-18 ENCOUNTER — Telehealth (HOSPITAL_COMMUNITY): Payer: Self-pay | Admitting: *Deleted

## 2022-10-18 NOTE — Telephone Encounter (Signed)
Patient called asking for a letter about his PTSD and anxiety because the city of [place] keeps citing him for having a horse in his yard. He wants a letter to help him get approved for the horse being his emotional support animal. 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

## 2022-10-20 NOTE — Telephone Encounter (Signed)
Attempted to call patient back, I will not be able to do a letter for him as we do not write these letters. He may have to find a therapist. I was not able to leave a VM because the box was full, but if he calls again we can just let him know that I wont be able to give him a letter. Thanks

## 2022-10-28 ENCOUNTER — Encounter (HOSPITAL_COMMUNITY): Payer: Self-pay | Admitting: Student in an Organized Health Care Education/Training Program

## 2022-10-28 ENCOUNTER — Ambulatory Visit (HOSPITAL_COMMUNITY): Payer: No Payment, Other | Admitting: Student in an Organized Health Care Education/Training Program

## 2022-10-28 VITALS — BP 139/93 | HR 86 | Wt 213.0 lb

## 2022-10-28 DIAGNOSIS — F411 Generalized anxiety disorder: Secondary | ICD-10-CM

## 2022-10-28 DIAGNOSIS — F4312 Post-traumatic stress disorder, chronic: Secondary | ICD-10-CM

## 2022-10-28 MED ORDER — SERTRALINE HCL 50 MG PO TABS: 50.0000 mg | ORAL_TABLET | Freq: Every day | ORAL | 1 refills | Status: DC

## 2022-10-28 MED ORDER — QUETIAPINE FUMARATE 100 MG PO TABS: 100.0000 mg | ORAL_TABLET | Freq: Every day | ORAL | 1 refills | Status: DC
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
## 2022-11-10 ENCOUNTER — Ambulatory Visit: Payer: Self-pay

## 2022-11-24 ENCOUNTER — Other Ambulatory Visit: Payer: Self-pay | Admitting: Infectious Diseases

## 2022-11-24 DIAGNOSIS — I10 Essential (primary) hypertension: Secondary | ICD-10-CM

## 2022-11-24 NOTE — Telephone Encounter (Signed)
Okay to refill? 

## 2022-12-16 ENCOUNTER — Encounter (HOSPITAL_COMMUNITY): Payer: Self-pay

## 2022-12-16 ENCOUNTER — Telehealth (HOSPITAL_COMMUNITY): Payer: No Payment, Other | Admitting: Student in an Organized Health Care Education/Training Program

## 2022-12-16 NOTE — Progress Notes (Unsigned)
No show, patient mailbox full cannot leave message. Waited for patient in video room patient did not show.   PGY-4 Eliseo Gum, MD

## 2022-12-20 ENCOUNTER — Other Ambulatory Visit (HOSPITAL_COMMUNITY): Payer: Self-pay | Admitting: Psychiatry

## 2022-12-30 ENCOUNTER — Telehealth (HOSPITAL_COMMUNITY): Payer: Self-pay

## 2022-12-30 NOTE — Telephone Encounter (Signed)
Medication refills - Call from patient stating he is in need of a new Quetiapine 100 mg order, last provided 10/28/22 with 1 refill. Pt was not aware he missed an appointmetn 12/16/22 and requested refills and agreed to have staff call back to reschedule. Called patient's preferred [facility] on [address] in [place] as they verified patient was in need of a new order. Agreed with patient to send request to Dr. Morrie Sheldon.

## 2023-01-02 ENCOUNTER — Telehealth (HOSPITAL_COMMUNITY): Payer: Self-pay | Admitting: Student in an Organized Health Care Education/Training Program

## 2023-01-02 ENCOUNTER — Other Ambulatory Visit (HOSPITAL_COMMUNITY): Payer: Self-pay | Admitting: Family

## 2023-01-02 ENCOUNTER — Other Ambulatory Visit: Payer: Self-pay | Admitting: Family

## 2023-01-02 MED ORDER — QUETIAPINE FUMARATE 100 MG PO TABS: 100.0000 mg | ORAL_TABLET | Freq: Every day | ORAL | 1 refills | Status: DC

## 2023-01-02 MED ORDER — QUETIAPINE FUMARATE 100 MG PO TABS: 100.0000 mg | ORAL_TABLET | Freq: Two times a day (BID) | ORAL | 0 refills | Status: DC

## 2023-01-02 NOTE — Progress Notes (Cosign Needed)
Courtesy refill was provided with quetiapine 100 mg nightly.

## 2023-01-02 NOTE — Telephone Encounter (Signed)
I am fine with him getting a refill and a new appt. Unfortunately, I will need assistance with the refill because he is a [employer] patient.

## 2023-01-11 ENCOUNTER — Telehealth: Payer: Self-pay

## 2023-01-11 ENCOUNTER — Other Ambulatory Visit (HOSPITAL_COMMUNITY): Payer: Self-pay

## 2023-01-11 NOTE — Telephone Encounter (Signed)
 Tried to return patient's call. The line said not available and mailbox is full.  Pt needs to schedule to renew financial application and apply for Medicaid.   Max Ray

## 2023-01-13 ENCOUNTER — Encounter (HOSPITAL_COMMUNITY): Payer: Self-pay | Admitting: Student in an Organized Health Care Education/Training Program

## 2023-01-13 ENCOUNTER — Telehealth (INDEPENDENT_AMBULATORY_CARE_PROVIDER_SITE_OTHER): Payer: No Payment, Other | Admitting: Student in an Organized Health Care Education/Training Program

## 2023-01-13 DIAGNOSIS — F411 Generalized anxiety disorder: Secondary | ICD-10-CM | POA: Diagnosis not present

## 2023-01-13 DIAGNOSIS — F4312 Post-traumatic stress disorder, chronic: Secondary | ICD-10-CM | POA: Diagnosis not present

## 2023-01-13 DIAGNOSIS — F1011 Alcohol abuse, in remission: Secondary | ICD-10-CM | POA: Diagnosis not present

## 2023-01-13 DIAGNOSIS — F3181 Bipolar II disorder: Secondary | ICD-10-CM

## 2023-01-13 MED ORDER — QUETIAPINE FUMARATE 100 MG PO TABS
100.0000 mg | ORAL_TABLET | Freq: Every day | ORAL | 1 refills | Status: DC
Start: 2023-01-13 — End: 2023-02-03

## 2023-01-13 MED ORDER — SERTRALINE HCL 25 MG PO TABS: 25.0000 mg | ORAL_TABLET | Freq: Every day | ORAL | 1 refills | Status: DC
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
## 2023-02-03 ENCOUNTER — Other Ambulatory Visit: Payer: Self-pay | Admitting: Child and Adolescent Psychiatry

## 2023-02-03 ENCOUNTER — Telehealth (INDEPENDENT_AMBULATORY_CARE_PROVIDER_SITE_OTHER): Payer: No Payment, Other | Admitting: Student in an Organized Health Care Education/Training Program

## 2023-02-03 ENCOUNTER — Encounter (HOSPITAL_COMMUNITY): Payer: Self-pay | Admitting: Student in an Organized Health Care Education/Training Program

## 2023-02-03 DIAGNOSIS — F1011 Alcohol abuse, in remission: Secondary | ICD-10-CM

## 2023-02-03 DIAGNOSIS — F1994 Other psychoactive substance use, unspecified with psychoactive substance-induced mood disorder: Secondary | ICD-10-CM

## 2023-02-03 DIAGNOSIS — F3181 Bipolar II disorder: Secondary | ICD-10-CM

## 2023-02-03 DIAGNOSIS — F4310 Post-traumatic stress disorder, unspecified: Secondary | ICD-10-CM

## 2023-02-03 MED ORDER — QUETIAPINE FUMARATE 100 MG PO TABS: 100.0000 mg | ORAL_TABLET | Freq: Every day | ORAL | 1 refills | Status: DC

## 2023-02-03 MED ORDER — SERTRALINE HCL 50 MG PO TABS: 75.0000 mg | ORAL_TABLET | Freq: Every day | ORAL | 1 refills | Status: DC
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
## 2023-02-16 ENCOUNTER — Other Ambulatory Visit: Payer: Self-pay

## 2023-02-16 ENCOUNTER — Ambulatory Visit: Payer: Self-pay

## 2023-02-16 ENCOUNTER — Encounter: Payer: Self-pay | Admitting: Infectious Diseases

## 2023-02-16 ENCOUNTER — Ambulatory Visit (INDEPENDENT_AMBULATORY_CARE_PROVIDER_SITE_OTHER): Payer: Self-pay | Admitting: Infectious Diseases

## 2023-02-16 VITALS — BP 149/100 | HR 80 | Temp 98.3°F | Ht 70.0 in | Wt 214.0 lb

## 2023-02-16 DIAGNOSIS — Z8619 Personal history of other infectious and parasitic diseases: Secondary | ICD-10-CM

## 2023-02-16 DIAGNOSIS — Z21 Asymptomatic human immunodeficiency virus [HIV] infection status: Secondary | ICD-10-CM

## 2023-02-16 DIAGNOSIS — T7421XA Adult sexual abuse, confirmed, initial encounter: Secondary | ICD-10-CM

## 2023-02-16 DIAGNOSIS — Z113 Encounter for screening for infections with a predominantly sexual mode of transmission: Secondary | ICD-10-CM

## 2023-02-16 DIAGNOSIS — B20 Human immunodeficiency virus [HIV] disease: Secondary | ICD-10-CM
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
## 2023-02-16 NOTE — Patient Instructions (Addendum)
Please continue your [facility] everyday.   Please continue your Pitavastatin and your Diovan (blood pressure medicine)

## 2023-02-17 LAB — CYTOLOGY, (ORAL, ANAL, URETHRAL) ANCILLARY ONLY
Chlamydia: NEGATIVE
Chlamydia: NEGATIVE
Comment: NEGATIVE
Comment: NEGATIVE
Comment: NORMAL
Comment: NORMAL
Neisseria Gonorrhea: NEGATIVE
Neisseria Gonorrhea: NEGATIVE

## 2023-02-17 LAB — URINE CYTOLOGY ANCILLARY ONLY
Chlamydia: NEGATIVE
Comment: NEGATIVE
Comment: NORMAL
Neisseria Gonorrhea: NEGATIVE

## 2023-02-17 LAB — T-HELPER CELLS (CD4) COUNT (NOT AT ARMC)
CD4 % Helper T Cell: 43 % (ref 33–65)
CD4 T Cell Abs: 1110 /uL (ref 400–1790)

## 2023-02-19 LAB — RPR TITER: RPR Titer: 1:4 {titer} — ABNORMAL HIGH

## 2023-02-19 LAB — RPR: RPR Ser Ql: REACTIVE — AB

## 2023-02-19 LAB — HIV-1 RNA QUANT-NO REFLEX-BLD
HIV 1 RNA Quant: NOT DETECTED {copies}/mL
HIV-1 RNA Quant, Log: NOT DETECTED {Log}

## 2023-02-19 LAB — T PALLIDUM AB: T Pallidum Abs: POSITIVE — AB

## 2023-02-21 ENCOUNTER — Telehealth: Payer: Self-pay

## 2023-02-21 NOTE — Telephone Encounter (Signed)
-----   Message from [place] sent at 02/21/2023 12:19 PM EST ----- Please call [place] to let him know that all STI testing has returned negative.   Will need to get him back in for an appointment in 6 months with me - I think we forgot to schedule that when he left.

## 2023-02-21 NOTE — Telephone Encounter (Signed)
Called [place] to relay results and schedule follow up, no answer and voicemail is full.  Sandie Ano, RN

## 2023-02-24 NOTE — Telephone Encounter (Signed)
Second attempt to reach patient, no answer and voicemail full.   Beryle Flock, RN

## 2023-02-27 NOTE — Telephone Encounter (Signed)
 Spoke to patient relayed negative STI results and scheduled for follow up

## 2023-03-10 ENCOUNTER — Telehealth (INDEPENDENT_AMBULATORY_CARE_PROVIDER_SITE_OTHER): Payer: No Payment, Other | Admitting: Student in an Organized Health Care Education/Training Program

## 2023-03-10 ENCOUNTER — Encounter (HOSPITAL_COMMUNITY): Payer: Self-pay | Admitting: Student in an Organized Health Care Education/Training Program

## 2023-03-10 DIAGNOSIS — F1011 Alcohol abuse, in remission: Secondary | ICD-10-CM

## 2023-03-10 DIAGNOSIS — F1994 Other psychoactive substance use, unspecified with psychoactive substance-induced mood disorder: Secondary | ICD-10-CM

## 2023-03-10 DIAGNOSIS — F4310 Post-traumatic stress disorder, unspecified: Secondary | ICD-10-CM | POA: Diagnosis not present

## 2023-03-10 MED ORDER — SERTRALINE HCL 50 MG PO TABS: 75.0000 mg | ORAL_TABLET | Freq: Every day | ORAL | 1 refills | Status: DC

## 2023-03-10 MED ORDER — QUETIAPINE FUMARATE 150 MG PO TABS
150.0000 mg | ORAL_TABLET | Freq: Every day | ORAL | 2 refills | Status: DC
Start: 2023-03-10 — End: 2023-04-14
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
## 2023-03-20 ENCOUNTER — Telehealth: Payer: Self-pay

## 2023-03-20 ENCOUNTER — Other Ambulatory Visit: Payer: Self-pay | Admitting: Infectious Diseases

## 2023-03-20 DIAGNOSIS — N529 Male erectile dysfunction, unspecified: Secondary | ICD-10-CM

## 2023-03-20 NOTE — Telephone Encounter (Signed)
 Requesting refill for Sildenafil  be sent to [facility] [address]

## 2023-03-20 NOTE — Telephone Encounter (Signed)
 Done

## 2023-04-14 ENCOUNTER — Encounter (HOSPITAL_COMMUNITY): Payer: Self-pay | Admitting: Student in an Organized Health Care Education/Training Program

## 2023-04-14 ENCOUNTER — Telehealth (HOSPITAL_COMMUNITY): Admitting: Student in an Organized Health Care Education/Training Program

## 2023-04-14 DIAGNOSIS — F1994 Other psychoactive substance use, unspecified with psychoactive substance-induced mood disorder: Secondary | ICD-10-CM | POA: Diagnosis not present

## 2023-04-14 DIAGNOSIS — Z5181 Encounter for therapeutic drug level monitoring: Secondary | ICD-10-CM

## 2023-04-14 DIAGNOSIS — F4310 Post-traumatic stress disorder, unspecified: Secondary | ICD-10-CM | POA: Diagnosis not present

## 2023-04-14 MED ORDER — QUETIAPINE FUMARATE 200 MG PO TABS: 200.0000 mg | ORAL_TABLET | Freq: Every day | ORAL | 1 refills | Status: DC

## 2023-04-14 MED ORDER — SERTRALINE HCL 50 MG PO TABS: 75.0000 mg | ORAL_TABLET | Freq: Every day | ORAL | 1 refills | Status: DC
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
## 2023-05-01 ENCOUNTER — Telehealth: Payer: Self-pay

## 2023-05-01 ENCOUNTER — Other Ambulatory Visit (HOSPITAL_COMMUNITY): Payer: Self-pay

## 2023-05-01 ENCOUNTER — Other Ambulatory Visit (HOSPITAL_COMMUNITY)

## 2023-05-01 DIAGNOSIS — F1994 Other psychoactive substance use, unspecified with psychoactive substance-induced mood disorder: Secondary | ICD-10-CM

## 2023-05-01 DIAGNOSIS — Z5181 Encounter for therapeutic drug level monitoring: Secondary | ICD-10-CM

## 2023-05-01 MED ORDER — CHLORHEXIDINE GLUCONATE 0.12 % MT SOLN: 15.0000 mL | Freq: Two times a day (BID) | OROMUCOSAL | 1 refills | Status: DC

## 2023-05-01 MED ORDER — AMOXICILLIN-POT CLAVULANATE 875-125 MG PO TABS: 1.0000 | ORAL_TABLET | Freq: Two times a day (BID) | ORAL | 0 refills | Status: DC

## 2023-05-01 NOTE — Telephone Encounter (Signed)
 Patient walked in today complains of tooth pain on the right side of his mouth. Patient reports pain for a couple of weeks. Patient is requesting an abx. I have had the patient fill out an updated dental form and scheduled with our dental team for 05/23/23 @ 3 pm. Patient advised also to make sure he makes it to the appointment. Per [facility] patient missed several appointments with them in the past.  Danelia Snodgrass Adel Holt, CMA

## 2023-05-01 NOTE — Addendum Note (Signed)
 Addended by: Orson Blalock on: 05/01/2023 03:07 PM   Modules accepted: Orders

## 2023-05-01 NOTE — Telephone Encounter (Signed)
 Will send in augmentin x 7d for him and some CHG mouth rinse to use twice a day

## 2023-05-01 NOTE — Telephone Encounter (Signed)
Patient aware medications have been sent to the pharmacy.

## 2023-05-02 ENCOUNTER — Telehealth: Payer: Self-pay

## 2023-05-02 LAB — COMPREHENSIVE METABOLIC PANEL WITH GFR
ALT: 17 [IU]/L (ref 0–44)
AST: 24 [IU]/L (ref 0–40)
Albumin: 5.2 g/dL — ABNORMAL HIGH (ref 3.9–4.9)
Alkaline Phosphatase: 110 [IU]/L (ref 44–121)
BUN/Creatinine Ratio: 6 — ABNORMAL LOW (ref 10–24)
BUN: 7 mg/dL — ABNORMAL LOW (ref 8–27)
Bilirubin Total: 0.7 mg/dL (ref 0.0–1.2)
CO2: 24 mmol/L (ref 20–29)
Calcium: 10.4 mg/dL — ABNORMAL HIGH (ref 8.6–10.2)
Chloride: 98 mmol/L (ref 96–106)
Creatinine, Ser: 1.22 mg/dL (ref 0.76–1.27)
Globulin, Total: 3.6 g/dL (ref 1.5–4.5)
Glucose: 102 mg/dL — ABNORMAL HIGH (ref 70–99)
Potassium: 4.3 mmol/L (ref 3.5–5.2)
Sodium: 139 mmol/L (ref 134–144)
Total Protein: 8.8 g/dL — ABNORMAL HIGH (ref 6.0–8.5)
eGFR: 67 mL/min/{1.73_m2}

## 2023-05-02 LAB — CBC WITH DIFFERENTIAL/PLATELET
Basophils Absolute: 0 10*3/uL (ref 0.0–0.2)
Basos: 0 %
EOS (ABSOLUTE): 0 10*3/uL (ref 0.0–0.4)
Eos: 0 %
Hematocrit: 48.5 % (ref 37.5–51.0)
Hemoglobin: 16.7 g/dL (ref 13.0–17.7)
Immature Grans (Abs): 0.1 10*3/uL (ref 0.0–0.1)
Immature Granulocytes: 1 %
Lymphocytes Absolute: 3 10*3/uL (ref 0.7–3.1)
Lymphs: 19 %
MCH: 33.8 pg — ABNORMAL HIGH (ref 26.6–33.0)
MCHC: 34.4 g/dL (ref 31.5–35.7)
MCV: 98 fL — ABNORMAL HIGH (ref 79–97)
Monocytes Absolute: 1.3 10*3/uL — ABNORMAL HIGH (ref 0.1–0.9)
Monocytes: 8 %
Neutrophils Absolute: 11.6 10*3/uL — ABNORMAL HIGH (ref 1.4–7.0)
Neutrophils: 72 %
Platelets: 291 10*3/uL (ref 150–450)
RBC: 4.94 x10E6/uL (ref 4.14–5.80)
RDW: 12 % (ref 11.6–15.4)
WBC: 16 10*3/uL — ABNORMAL HIGH (ref 3.4–10.8)

## 2023-05-02 LAB — LIPID PANEL
Chol/HDL Ratio: 3.6 ratio (ref 0.0–5.0)
Cholesterol, Total: 267 mg/dL — ABNORMAL HIGH (ref 100–199)
HDL: 75 mg/dL
LDL Chol Calc (NIH): 134 mg/dL — ABNORMAL HIGH (ref 0–99)
Triglycerides: 325 mg/dL — ABNORMAL HIGH (ref 0–149)
VLDL Cholesterol Cal: 58 mg/dL — ABNORMAL HIGH (ref 5–40)

## 2023-05-02 LAB — HEMOGLOBIN A1C
Est. average glucose Bld gHb Est-mCnc: 91 mg/dL
Hgb A1c MFr Bld: 4.8 % (ref 4.8–5.6)

## 2023-05-02 LAB — TSH: TSH: 1.6 u[IU]/mL (ref 0.450–4.500)

## 2023-05-02 MED ORDER — ROSUVASTATIN CALCIUM 10 MG PO TABS: 10.0000 mg | ORAL_TABLET | Freq: Every day | ORAL | 5 refills | Status: DC

## 2023-05-02 NOTE — Telephone Encounter (Signed)
 Crestor 10 mg tabs sent in - these appear to be on formulary for him

## 2023-05-02 NOTE — Telephone Encounter (Signed)
 Patient aware of medication change.

## 2023-05-02 NOTE — Addendum Note (Signed)
 Addended by: Orson Blalock on: 05/02/2023 01:28 PM   Modules accepted: Orders

## 2023-05-02 NOTE — Telephone Encounter (Signed)
 Pitavastatin  not covered under insurance. Please send in alternative for patient to [facility] speciality.

## 2023-05-05 ENCOUNTER — Telehealth (HOSPITAL_COMMUNITY): Payer: Self-pay | Admitting: Student in an Organized Health Care Education/Training Program

## 2023-05-05 DIAGNOSIS — F4310 Post-traumatic stress disorder, unspecified: Secondary | ICD-10-CM

## 2023-05-05 DIAGNOSIS — F1994 Other psychoactive substance use, unspecified with psychoactive substance-induced mood disorder: Secondary | ICD-10-CM

## 2023-05-05 MED ORDER — SERTRALINE HCL 50 MG PO TABS: 75.0000 mg | ORAL_TABLET | Freq: Every day | ORAL | 1 refills | Status: DC

## 2023-05-05 NOTE — Telephone Encounter (Signed)
 Spoke with patient about his labs and concern for hypertriglyceridemia and hyperlipidemia. Have attached PCP at pt request and will talk to pt at next appt about possibly adjustments to Seroquel  regimen. Pt also requested refill on Zoloft .   PGY-4 Marvetta Sloan, MD

## 2023-05-11 ENCOUNTER — Other Ambulatory Visit (HOSPITAL_COMMUNITY): Payer: Self-pay

## 2023-05-12 ENCOUNTER — Telehealth (HOSPITAL_COMMUNITY): Admitting: Student in an Organized Health Care Education/Training Program

## 2023-05-12 ENCOUNTER — Encounter (HOSPITAL_COMMUNITY): Payer: Self-pay

## 2023-05-12 DIAGNOSIS — E785 Hyperlipidemia, unspecified: Secondary | ICD-10-CM | POA: Insufficient documentation

## 2023-05-23 ENCOUNTER — Ambulatory Visit: Payer: Self-pay

## 2023-05-23 ENCOUNTER — Other Ambulatory Visit: Payer: Self-pay

## 2023-06-06 ENCOUNTER — Telehealth (HOSPITAL_COMMUNITY): Payer: Self-pay | Admitting: *Deleted

## 2023-06-06 ENCOUNTER — Telehealth (HOSPITAL_COMMUNITY): Payer: Self-pay

## 2023-06-06 NOTE — Telephone Encounter (Signed)
 Fax received for PA approval of Quetiapine  Fumarate 200mg  until 06/05/24. Called to notified pharmacy.

## 2023-06-06 NOTE — Telephone Encounter (Signed)
 Pts PA was started for his QUEtiapine  Fumarate 200MG  tablets. Which has been approved from 06/06/2023-06/06/2023. Called to notify pharmacy.  JNL
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
## 2023-07-27 ENCOUNTER — Encounter (HOSPITAL_COMMUNITY): Admitting: Psychiatry

## 2023-08-23 ENCOUNTER — Other Ambulatory Visit: Payer: Self-pay | Admitting: Infectious Diseases

## 2023-08-23 DIAGNOSIS — B20 Human immunodeficiency virus [HIV] disease: Secondary | ICD-10-CM

## 2023-08-28 ENCOUNTER — Other Ambulatory Visit: Payer: Self-pay

## 2023-08-28 ENCOUNTER — Encounter: Payer: Self-pay | Admitting: Infectious Diseases

## 2023-08-28 ENCOUNTER — Other Ambulatory Visit (HOSPITAL_COMMUNITY)
Admission: RE | Admit: 2023-08-28 | Discharge: 2023-08-28 | Disposition: A | Discharge status: Home / Self Care | Source: Ambulatory Visit | Attending: Infectious Diseases | Admitting: Infectious Diseases

## 2023-08-28 ENCOUNTER — Ambulatory Visit: Payer: Self-pay | Admitting: Infectious Diseases

## 2023-08-28 VITALS — BP 164/101 | HR 95 | Temp 97.6°F | Ht 70.0 in | Wt 217.0 lb

## 2023-08-28 DIAGNOSIS — B20 Human immunodeficiency virus [HIV] disease: Secondary | ICD-10-CM

## 2023-08-28 DIAGNOSIS — Z23 Encounter for immunization: Secondary | ICD-10-CM

## 2023-08-28 DIAGNOSIS — I10 Essential (primary) hypertension: Secondary | ICD-10-CM | POA: Diagnosis not present

## 2023-08-28 DIAGNOSIS — Z113 Encounter for screening for infections with a predominantly sexual mode of transmission: Secondary | ICD-10-CM | POA: Insufficient documentation

## 2023-08-28 DIAGNOSIS — K047 Periapical abscess without sinus: Secondary | ICD-10-CM

## 2023-08-28 DIAGNOSIS — E785 Hyperlipidemia, unspecified: Secondary | ICD-10-CM | POA: Diagnosis not present

## 2023-08-28 MED ORDER — ROSUVASTATIN CALCIUM 20 MG PO TABS: 20.0000 mg | ORAL_TABLET | Freq: Every day | ORAL | 5 refills | Status: DC

## 2023-08-28 MED ORDER — AMOXICILLIN-POT CLAVULANATE 875-125 MG PO TABS: 1.0000 | ORAL_TABLET | Freq: Two times a day (BID) | ORAL | 0 refills | Status: DC

## 2023-08-28 MED ORDER — CHLORHEXIDINE GLUCONATE 0.12 % MT SOLN: 15.0000 mL | Freq: Two times a day (BID) | OROMUCOSAL | 1 refills | Status: DC

## 2023-08-28 NOTE — Progress Notes (Signed)
 Name: Max Ray  DOB: Aug 02, 1960 MRN: [id number] PCP: Pcp, No    Brief Narrative:  Max Ray is a 63 y.o. male with well controlled HIV.  CD4 nadir unknown  HIV Risk: MSM/Bisexual History of OIs: None known Intake Labs: 2013 Hep B sAg (-), sAb (+); Hep A (vax 2014), Hep C (-) Quantiferon (-) HLA B*5701 ([) G6PD: ()   Previous Regimens: Complera  Odefsey  >> undetectable   Genotypes: None on file  Subjective   Subjective:   Chief Complaint  Patient presents with   Follow-up    Discussed the use of AI scribe software for clinical note transcription with the patient, who gave verbal consent to proceed.  History of Present Illness   Max Ray is a 63 year old male with HIV who presents for routine follow-up care for HIV management.  He is currently on Odefsey , taken once daily with food, and his last viral load was undetectable with a CD4 count of 1110. A recent RPR was reactive with a titer of 1:4, and urine cytology was negative.  In April, he experienced a dental infection treated with Augmentin  and CHG rinse. The infection has returned, causing significant pain and sleep disturbance. He has been using Tylenol  for pain relief and plans to contact his dental provider for further evaluation.  He visited urgent care on June 17th for right foot pain following an injury from his horse. The visit included discussions about his dental issues, HIV medication, use of a natural supplement for cholesterol, nightmares and restless leg syndrome, and his upcoming appointment with a psychiatrist. The provider plans to see him again in three months and requested an earlier visit for a fasting blood test. His current medication regimen was reviewed, including Odefsey , which must be taken with food and has restrictions with other medications.  He experiences intense and vivid dreams, which have increased in frequency over the past three to four months. These dreams sometimes  cause physical reactions during sleep, such as kicking or punching, which wake him up. He attributes these episodes to PTSD and notes they have become more pronounced since his sertraline  dose was increased to 200 mg. He is concerned about these symptoms and their impact on his sleep. He was supposed to follow up with his psychiatrist earlier this year.   He is currently taking Odefsey  for HIV, Crestor  for cholesterol, and sertraline  for mood support. He also uses a natural supplement for cholesterol management. He is taking valsartan  along with a natural remedy for blood pressure management.      Review of Systems  Constitutional:  Negative for chills and fever.  Eyes:  Negative for double vision and photophobia.  Respiratory:  Negative for cough and shortness of breath.   Cardiovascular:  Negative for chest pain and leg swelling.  Gastrointestinal:  Negative for abdominal pain, diarrhea and vomiting.  Genitourinary:  Negative for dysuria.  Skin:  Negative for rash.  Psychiatric/Behavioral:  Positive for depression. The patient is nervous/anxious.     Past Medical History:  Diagnosis Date   Anxiety    HIV disease (HCC)    Hypertension    Pneumonia    Syphilis     Outpatient Medications Prior to Visit  Medication Sig Dispense Refill   acetaminophen  (TYLENOL ) 650 MG CR tablet Take by mouth.     Multiple Vitamin (MULTIVITAMIN) tablet Take 1 tablet by mouth daily. Men +50 Centrum     ODEFSEY  200-25-25 MG TABS tablet TAKE 1 TABLET BY  MOUTH DAILY 30 tablet 2   QUEtiapine  (SEROQUEL ) 200 MG tablet Take 1 tablet (200 mg total) by mouth at bedtime. 30 tablet 1   sertraline  (ZOLOFT ) 50 MG tablet Take 1.5 tablets (75 mg total) by mouth daily. 45 tablet 1   sildenafil  (VIAGRA ) 100 MG tablet TAKE 1 TABLET BY MOUTH ONCE DAILY AS NEEDED FOR ERECTILE DYSFUNCTION 30 tablet 2   Turmeric Curcumin 500 MG CAPS Take 1 capsule by mouth daily.     valsartan  (DIOVAN ) 160 MG tablet TAKE 1 TABLET(160 MG) BY  MOUTH DAILY 30 tablet 11   rosuvastatin  (CRESTOR ) 10 MG tablet Take 1 tablet (10 mg total) by mouth daily. 30 tablet 5   Apple Cid Vn-Grn Tea-Bit Or-Cr (APPLE CIDER VINEGAR PLUS) TABS Take by mouth. (Patient not taking: Reported on 02/16/2023)     Magnesium 400 MG CAPS Take 1 capsule by mouth daily. (Patient not taking: Reported on 02/16/2023)     Misc Natural Products (IMMUNE TRIO PO) Take 1 tablet by mouth. (Patient not taking: Reported on 02/16/2023)     Misc Natural Products (OSTEO BI-FLEX ADV JOINT SHIELD) TABS Take 1 tablet by mouth. (Patient not taking: Reported on 02/16/2023)     Misc Natural Products (PROSTATE HEALTH) CAPS Take 1 capsule by mouth daily. (Patient not taking: Reported on 02/16/2023)     NON FORMULARY Nitric oxide - 2 capsules a day     Sodium Selenite POWD Take 1 tablet by mouth daily. (Patient not taking: Reported on 02/16/2023)     amoxicillin -clavulanate (AUGMENTIN ) 875-125 MG tablet Take 1 tablet by mouth 2 (two) times daily. 14 tablet 0   chlorhexidine  (PERIDEX ) 0.12 % solution Use as directed 15 mLs in the mouth or throat 2 (two) times daily. 473 mL 1   No facility-administered medications prior to visit.     Allergies  Allergen Reactions   Sulfamethoxazole-Trimethoprim Other (See Comments)    Unknown allergic reaction   Sulfonamide Derivatives     Social History   Tobacco Use   Smoking status: Never    Passive exposure: Never   Smokeless tobacco: Never  Substance Use Topics   Alcohol use: Not Currently    Alcohol/week: 1.0 standard drink of alcohol    Types: 1 Standard drinks or equivalent per week    Comment: social   Drug use: No     Social History   Substance and Sexual Activity  Sexual Activity Yes   Partners: Male   Birth control/protection: Condom   Comment: DECLINED CONDOMS     Objective    Objective:   Vitals:   08/28/23 1300  BP: (!) 164/101  Pulse: 95  Temp: 97.6 F (36.4 C)  TempSrc: Temporal  SpO2: 96%  Weight: 217 lb  (98.4 kg)  Height: 5' 10 (1.778 m)    Body mass index is 31.14 kg/m.   Physical Exam HENT:     Mouth/Throat:     Mouth: No oral lesions.     Dentition: Normal dentition. No dental caries.  Eyes:     General: No scleral icterus. Cardiovascular:     Rate and Rhythm: Normal rate and regular rhythm.     Heart sounds: Normal heart sounds.  Pulmonary:     Effort: Pulmonary effort is normal.     Breath sounds: Normal breath sounds.  Abdominal:     General: There is no distension.     Palpations: Abdomen is soft.     Tenderness: There is no abdominal tenderness.  Lymphadenopathy:  Cervical: No cervical adenopathy.  Skin:    General: Skin is warm and dry.     Findings: No rash.  Neurological:     Mental Status: He is alert and oriented to person, place, and time.     Lab Results Lab Results  Component Value Date   WBC 16.0 (H) 05/01/2023   HGB 16.7 05/01/2023   HCT 48.5 05/01/2023   MCV 98 (H) 05/01/2023   PLT 291 05/01/2023    Lab Results  Component Value Date   CREATININE 1.22 05/01/2023   BUN 7 (L) 05/01/2023   NA 139 05/01/2023   K 4.3 05/01/2023   CL 98 05/01/2023   CO2 24 05/01/2023    Lab Results  Component Value Date   ALT 17 05/01/2023   AST 24 05/01/2023   ALKPHOS 110 05/01/2023   BILITOT 0.7 05/01/2023    Lab Results  Component Value Date   CHOL 267 (H) 05/01/2023   HDL 75 05/01/2023   LDLCALC 134 (H) 05/01/2023   TRIG 325 (H) 05/01/2023   CHOLHDL 3.6 05/01/2023   HIV 1 RNA Quant (Copies/mL)  Date Value  02/16/2023 Not Detected  09/19/2022 Not Detected  05/31/2022 <20 (H)   CD4 T Cell Abs (/uL)  Date Value  02/16/2023 1,110  09/19/2022 851  03/07/2022 877       Assessment & Plan:     HIV infection - HIV infection is well-managed with Odefsey . Viral load is undetectable, and CD4 count is 1110. No signs of resistance or reactivation. Avoid PPIs and H2 blockers due to interactions with rilpivirine . Sexual health screenings  (asymptomatic today)  - Continue Odefsey  once daily with food - Recheck viral load today - Avoid PPIs and H2 blockers  Dental Infection, Relapsed from April -  Recurrent dental abscess causing significant pain, likely related to a previous dental infection. Throbbing pain and swelling have recurred after four months of being asymptomatic. Non-compliance with previous antibiotic course noted.  - Prescribe Augmentin  for dental abscess - Prescribe CHG mouth rinse twice daily - Advise follow-up with dental team for further evaluation and possible root canal  Hyperlipidemia - Hyperlipidemia with total cholesterol of 267 and elevated triglycerides. Managed with rosuvastatin  - will increase to 20 mg daily and repeat a fasting lipid at return in 3 months.  - Increase dose of rosuvastatin  - Recheck cholesterol levels at next visit  Hypertension - Hypertension management is suboptimal, possibly due to non-adherence to valsartan  and dietary factors. Blood pressure was elevated during the visit. - Encourage adherence to valsartan  - Advise monitoring blood pressure at home  Sleep disturbance with nightmares and parasomnias - Experiencing intense nightmares and parasomnias, possibly related to PTSD and / or recent increase in sertraline  dose. Symptoms include physical movements during sleep, such as kicking and punching, occurring for the past 3-4 months. - Discuss symptoms with psychiatrist, Dr. Reggie - Advise keeping a dream journal to track patterns and triggers       Meds ordered this encounter  Medications   amoxicillin -clavulanate (AUGMENTIN ) 875-125 MG tablet    Sig: Take 1 tablet by mouth 2 (two) times daily.    Dispense:  14 tablet    Refill:  0   chlorhexidine  (PERIDEX ) 0.12 % solution    Sig: Use as directed 15 mLs in the mouth or throat 2 (two) times daily.    Dispense:  473 mL    Refill:  1   rosuvastatin  (CRESTOR ) 20 MG tablet    Sig: Take 1  tablet (20 mg total) by mouth daily.     Dispense:  30 tablet    Refill:  5    Orders Placed This Encounter  Procedures   HIV 1 RNA quant-no reflex-bld   T-helper cells (CD4) count   RPR    Return in about 3 months (around 11/28/2023).   Corean Fireman, MSN, NP-C [facility] for Infectious Disease [facility] Pager: [phone] Office: [phone]  08/28/23  2:10 PM

## 2023-08-28 NOTE — Patient Instructions (Addendum)
 Please call Dr. Reggie to discuss the changes you told me about. I do worry you may be having some side effects to the sertraline  dose you are on.   Start the augmentin  twice a day with food for the dental infection - please give the dental team a call to update them   Start the Peridex  rinse twice a day.   Will have you back in 3 months to do a fasting blood check to look at the.

## 2023-08-29 LAB — CYTOLOGY, (ORAL, ANAL, URETHRAL) ANCILLARY ONLY
Chlamydia: NEGATIVE
Chlamydia: NEGATIVE
Comment: NEGATIVE
Comment: NEGATIVE
Comment: NORMAL
Comment: NORMAL
Neisseria Gonorrhea: NEGATIVE
Neisseria Gonorrhea: NEGATIVE

## 2023-08-29 LAB — URINE CYTOLOGY ANCILLARY ONLY
Chlamydia: NEGATIVE
Comment: NEGATIVE
Comment: NORMAL
Neisseria Gonorrhea: NEGATIVE

## 2023-08-30 LAB — RPR: RPR Ser Ql: REACTIVE — AB

## 2023-08-30 LAB — T-HELPER CELLS (CD4) COUNT (NOT AT ARMC)
CD4 % Helper T Cell: 45 % (ref 33–65)
CD4 T Cell Abs: 751 /uL (ref 400–1790)

## 2023-08-30 LAB — HIV-1 RNA QUANT-NO REFLEX-BLD
HIV 1 RNA Quant: NOT DETECTED {copies}/mL
HIV-1 RNA Quant, Log: NOT DETECTED {Log_copies}/mL

## 2023-08-30 LAB — T PALLIDUM AB: T Pallidum Abs: POSITIVE — AB

## 2023-08-30 LAB — RPR TITER: RPR Titer: 1:2 {titer} — ABNORMAL HIGH

## 2023-09-11 ENCOUNTER — Ambulatory Visit: Payer: Self-pay | Admitting: Infectious Diseases

## 2023-09-28 ENCOUNTER — Other Ambulatory Visit: Payer: Self-pay | Admitting: Infectious Diseases

## 2023-09-28 DIAGNOSIS — N529 Male erectile dysfunction, unspecified: Secondary | ICD-10-CM

## 2023-11-16 ENCOUNTER — Telehealth: Payer: Self-pay

## 2023-11-16 DIAGNOSIS — K047 Periapical abscess without sinus: Secondary | ICD-10-CM

## 2023-11-16 MED ORDER — CHLORHEXIDINE GLUCONATE 0.12 % MT SOLN: 15.0000 mL | Freq: Two times a day (BID) | OROMUCOSAL | 1 refills | Status: AC

## 2023-11-16 MED ORDER — AMOXICILLIN 500 MG PO CAPS: 500.0000 mg | ORAL_CAPSULE | Freq: Three times a day (TID) | ORAL | 0 refills | Status: AC

## 2023-11-16 NOTE — Addendum Note (Signed)
 Addended by: MELVENIA COREAN SAILOR on: 11/16/2023 03:29 PM   Modules accepted: Orders

## 2023-11-16 NOTE — Telephone Encounter (Signed)
 Patient left a voicemail requesting refill on Amoxicillin  for his oral pain. Patient reports gums swelling as well. Rx sent to [facility] in [place]. Main. Arda Daggs ONEIDA Ligas, CMA

## 2023-11-16 NOTE — Telephone Encounter (Signed)
 ADVISED RXS SENT

## 2023-12-05 ENCOUNTER — Other Ambulatory Visit: Payer: Self-pay | Admitting: Infectious Diseases

## 2023-12-05 DIAGNOSIS — B20 Human immunodeficiency virus [HIV] disease: Secondary | ICD-10-CM

## 2023-12-05 DIAGNOSIS — I10 Essential (primary) hypertension: Secondary | ICD-10-CM

## 2023-12-13 ENCOUNTER — Ambulatory Visit (INDEPENDENT_AMBULATORY_CARE_PROVIDER_SITE_OTHER): Admitting: Infectious Diseases

## 2023-12-13 ENCOUNTER — Other Ambulatory Visit: Payer: Self-pay

## 2023-12-13 ENCOUNTER — Encounter: Payer: Self-pay | Admitting: Infectious Diseases

## 2023-12-13 DIAGNOSIS — E785 Hyperlipidemia, unspecified: Secondary | ICD-10-CM

## 2023-12-13 DIAGNOSIS — B20 Human immunodeficiency virus [HIV] disease: Secondary | ICD-10-CM

## 2023-12-13 DIAGNOSIS — F1010 Alcohol abuse, uncomplicated: Secondary | ICD-10-CM

## 2023-12-13 MED ORDER — ODEFSEY 200-25-25 MG PO TABS: 1.0000 | ORAL_TABLET | Freq: Every day | ORAL | 11 refills | Status: AC

## 2023-12-13 MED ORDER — ROSUVASTATIN CALCIUM 20 MG PO TABS: 20.0000 mg | ORAL_TABLET | Freq: Every day | ORAL | 11 refills | Status: AC

## 2023-12-13 NOTE — Patient Instructions (Addendum)
°  I think it would be a good idea for you to see your psychiatry team to talk more about what we discussed today    [place] Medicaid Dental Providers:
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
## 2023-12-15 LAB — COMPLETE METABOLIC PANEL WITHOUT GFR
AG Ratio: 1.6 (calc) (ref 1.0–2.5)
ALT: 35 U/L (ref 9–46)
AST: 33 U/L (ref 10–35)
Albumin: 5.1 g/dL (ref 3.6–5.1)
Alkaline phosphatase (APISO): 87 U/L (ref 35–144)
BUN: 13 mg/dL (ref 7–25)
CO2: 28 mmol/L (ref 20–32)
Calcium: 9.9 mg/dL (ref 8.6–10.3)
Chloride: 103 mmol/L (ref 98–110)
Creat: 1.09 mg/dL (ref 0.70–1.35)
Globulin: 3.2 g/dL (ref 1.9–3.7)
Glucose, Bld: 92 mg/dL (ref 65–99)
Potassium: 4.7 mmol/L (ref 3.5–5.3)
Sodium: 139 mmol/L (ref 135–146)
Total Bilirubin: 0.5 mg/dL (ref 0.2–1.2)
Total Protein: 8.3 g/dL — ABNORMAL HIGH (ref 6.1–8.1)

## 2023-12-15 LAB — HIV-1 RNA QUANT-NO REFLEX-BLD
HIV 1 RNA Quant: NOT DETECTED {copies}/mL
HIV-1 RNA Quant, Log: NOT DETECTED {Log_copies}/mL

## 2023-12-19 ENCOUNTER — Ambulatory Visit: Payer: Self-pay | Admitting: Infectious Diseases

## 2023-12-19 NOTE — Telephone Encounter (Signed)
 Called Jovann to discuss, no answer and voicemail full.   Danea Manter, BSN, RN

## 2023-12-19 NOTE — Progress Notes (Signed)
 He does not check MyChart frequently - Please let Max Ray know that his labs look good - his viral load remains undetectable on the Odefsey .   His liver function tests look OK but I think it's still important he work on decreasing alcohol like we talked about. If he needs any help with resources we can help.  Thank you - he needs FU appt in 3 months scheduled too please

## 2023-12-21 ENCOUNTER — Telehealth (HOSPITAL_COMMUNITY): Payer: Self-pay | Admitting: *Deleted

## 2023-12-21 NOTE — Telephone Encounter (Signed)
 [facility] pharmacy sent over request for rx for sertraline  50 mg. Patient was last seen in April 2025 and no showed for his last two appts. This rx will not be considered till he is seen by a provider.

## 2024-01-08 ENCOUNTER — Telehealth: Payer: Self-pay | Admitting: Infectious Diseases

## 2024-01-08 ENCOUNTER — Telehealth: Payer: Self-pay

## 2024-01-08 MED ORDER — AMOXICILLIN-POT CLAVULANATE 875-125 MG PO TABS: 1.0000 | ORAL_TABLET | Freq: Two times a day (BID) | ORAL | 0 refills | Status: AC

## 2024-01-08 MED ORDER — AMOXICILLIN-POT CLAVULANATE 875-125 MG PO TABS: 1.0000 | ORAL_TABLET | Freq: Two times a day (BID) | ORAL | 0 refills | Status: DC

## 2024-01-08 NOTE — Addendum Note (Signed)
 Addended by: FRANCYNE CODE, Zully Frane M on: 01/08/2024 11:20 AM   Modules accepted: Orders

## 2024-01-08 NOTE — Telephone Encounter (Signed)
 Max Ray called asking for his Amoxicillin  to be sent to [facility] on [address] in [place] instead of [place] [facility]. Pt is currently at the pharmacy waiting and stated he was in a lot a pain. Pt asked for this correction asap.

## 2024-01-08 NOTE — Addendum Note (Signed)
 Addended by: MELVENIA COREAN SAILOR on: 01/08/2024 10:15 AM   Modules accepted: Orders

## 2024-01-08 NOTE — Telephone Encounter (Signed)
 Refill sent in for him - hopefully he has plugged himself in to a dentist for care - this has been a recurrent problem for him. I believe he may have had too many no shows for [facility] dental team here in the clinic.SABRASABRASABRA

## 2024-01-08 NOTE — Telephone Encounter (Signed)
 Patient called stating he is having left side facial swelling x5 days. Also notes gums are painful. Requesting refill on Amoxicillin - if okay would like for rx to go to [facility] in [facility].  Has not seen anyone for evaluation. Lorenda CHRISTELLA Code, RMA
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
## 2024-01-15 NOTE — Progress Notes (Signed)
 The ASCVD Risk score (Arnett DK, et al., 2019) failed to calculate for the following reasons:   Risk score cannot be calculated because patient has a medical history suggesting prior/existing ASCVD   * - Cholesterol units were assumed  Duwaine Lowe, BSN, [employer] [employer]

## 2024-01-18 ENCOUNTER — Telehealth (HOSPITAL_COMMUNITY): Payer: Self-pay | Admitting: *Deleted

## 2024-01-18 NOTE — Telephone Encounter (Signed)
 Fax request from the [facility] for a new rx of quetiapine . He has no showed for appts last year, he has a scheduled appt with Dr Izella on Jan 25 2024. He has not been seen in nearly 8 months so this rx wont be considered till he is seen by a provider. Will make this pharmacy aware to the outcome of fax request.

## 2024-01-25 ENCOUNTER — Ambulatory Visit (HOSPITAL_COMMUNITY): Admitting: Psychiatry

## 2024-01-25 VITALS — BP 146/102 | Wt 226.0 lb

## 2024-01-25 DIAGNOSIS — F3181 Bipolar II disorder: Secondary | ICD-10-CM | POA: Diagnosis not present

## 2024-01-25 DIAGNOSIS — F411 Generalized anxiety disorder: Secondary | ICD-10-CM | POA: Insufficient documentation

## 2024-01-25 DIAGNOSIS — F1994 Other psychoactive substance use, unspecified with psychoactive substance-induced mood disorder: Secondary | ICD-10-CM

## 2024-01-25 DIAGNOSIS — F4310 Post-traumatic stress disorder, unspecified: Secondary | ICD-10-CM

## 2024-01-25 MED ORDER — SERTRALINE HCL 50 MG PO TABS: ORAL_TABLET | ORAL | 0 refills | Status: AC

## 2024-01-25 MED ORDER — QUETIAPINE FUMARATE 200 MG PO TABS: ORAL_TABLET | ORAL | 0 refills | Status: AC

## 2024-02-01 ENCOUNTER — Other Ambulatory Visit (HOSPITAL_COMMUNITY): Payer: Self-pay | Admitting: Psychiatry

## 2024-02-01 DIAGNOSIS — F4310 Post-traumatic stress disorder, unspecified: Secondary | ICD-10-CM

## 2024-02-01 DIAGNOSIS — F1994 Other psychoactive substance use, unspecified with psychoactive substance-induced mood disorder: Secondary | ICD-10-CM

## 2024-02-27 ENCOUNTER — Ambulatory Visit (HOSPITAL_COMMUNITY)

## 2024-03-07 ENCOUNTER — Telehealth (HOSPITAL_COMMUNITY): Admitting: Psychiatry

## 2024-03-14 ENCOUNTER — Ambulatory Visit: Payer: Self-pay | Admitting: Infectious Diseases
